# Patient Record
Sex: Male | Born: 1965 | Race: White | Hispanic: No | Marital: Single | Smoking: Current every day smoker
Health system: Southern US, Community
[De-identification: ages and names within clinical notes are randomized; demographics above are authoritative.]

## PROBLEM LIST (undated history)

## (undated) DIAGNOSIS — F329 Major depressive disorder, single episode, unspecified: Secondary | ICD-10-CM

## (undated) DIAGNOSIS — F32A Depression, unspecified: Secondary | ICD-10-CM

## (undated) DIAGNOSIS — I10 Essential (primary) hypertension: Secondary | ICD-10-CM

## (undated) DIAGNOSIS — Z720 Tobacco use: Secondary | ICD-10-CM

## (undated) DIAGNOSIS — I219 Acute myocardial infarction, unspecified: Secondary | ICD-10-CM

## (undated) DIAGNOSIS — I509 Heart failure, unspecified: Secondary | ICD-10-CM

## (undated) DIAGNOSIS — R739 Hyperglycemia, unspecified: Secondary | ICD-10-CM

## (undated) DIAGNOSIS — F1910 Other psychoactive substance abuse, uncomplicated: Secondary | ICD-10-CM

## (undated) DIAGNOSIS — Z8711 Personal history of peptic ulcer disease: Secondary | ICD-10-CM

## (undated) DIAGNOSIS — Z8719 Personal history of other diseases of the digestive system: Secondary | ICD-10-CM

## (undated) DIAGNOSIS — F1410 Cocaine abuse, uncomplicated: Secondary | ICD-10-CM

## (undated) DIAGNOSIS — I779 Disorder of arteries and arterioles, unspecified: Secondary | ICD-10-CM

## (undated) DIAGNOSIS — I255 Ischemic cardiomyopathy: Secondary | ICD-10-CM

## (undated) DIAGNOSIS — I2510 Atherosclerotic heart disease of native coronary artery without angina pectoris: Secondary | ICD-10-CM

## (undated) HISTORY — PX: OTHER SURGICAL HISTORY: SHX169

## (undated) HISTORY — DX: Tobacco use: Z72.0

## (undated) HISTORY — DX: Cocaine abuse, uncomplicated: F14.10

## (undated) HISTORY — DX: Acute myocardial infarction, unspecified: I21.9

## (undated) HISTORY — DX: Essential (primary) hypertension: I10

## (undated) HISTORY — DX: Major depressive disorder, single episode, unspecified: F32.9

## (undated) HISTORY — DX: Depression, unspecified: F32.A

## (undated) HISTORY — DX: Hyperglycemia, unspecified: R73.9

## (undated) HISTORY — DX: Personal history of other diseases of the digestive system: Z87.19

## (undated) HISTORY — DX: Atherosclerotic heart disease of native coronary artery without angina pectoris: I25.10

## (undated) HISTORY — DX: Ischemic cardiomyopathy: I25.5

## (undated) HISTORY — PX: NISSEN FUNDOPLICATION: SHX2091

## (undated) HISTORY — DX: Personal history of peptic ulcer disease: Z87.11

## (undated) HISTORY — DX: Other psychoactive substance abuse, uncomplicated: F19.10

---

## 2005-07-29 ENCOUNTER — Ambulatory Visit: Payer: Self-pay | Admitting: Internal Medicine

## 2005-09-08 ENCOUNTER — Ambulatory Visit: Payer: Self-pay | Admitting: Gastroenterology

## 2005-09-09 ENCOUNTER — Ambulatory Visit: Payer: Self-pay | Admitting: Gastroenterology

## 2005-10-15 ENCOUNTER — Ambulatory Visit: Payer: Self-pay | Admitting: Surgery

## 2005-11-11 ENCOUNTER — Ambulatory Visit: Payer: Self-pay | Admitting: Surgery

## 2005-11-23 ENCOUNTER — Inpatient Hospital Stay: Payer: Self-pay | Admitting: Surgery

## 2006-03-09 ENCOUNTER — Ambulatory Visit: Payer: Self-pay | Admitting: Surgery

## 2008-10-29 ENCOUNTER — Inpatient Hospital Stay: Payer: Self-pay | Admitting: Unknown Physician Specialty

## 2009-02-18 ENCOUNTER — Emergency Department: Payer: Self-pay | Admitting: Emergency Medicine

## 2009-03-17 ENCOUNTER — Emergency Department: Payer: Self-pay | Admitting: Emergency Medicine

## 2009-04-11 ENCOUNTER — Inpatient Hospital Stay: Payer: Self-pay | Admitting: Psychiatry

## 2009-04-16 ENCOUNTER — Inpatient Hospital Stay: Payer: Self-pay | Admitting: Unknown Physician Specialty

## 2009-11-17 ENCOUNTER — Inpatient Hospital Stay: Payer: Self-pay | Admitting: Unknown Physician Specialty

## 2014-06-28 ENCOUNTER — Encounter: Payer: Self-pay | Admitting: Cardiovascular Disease

## 2014-06-28 ENCOUNTER — Inpatient Hospital Stay: Payer: Self-pay | Admitting: Internal Medicine

## 2014-06-28 DIAGNOSIS — I2510 Atherosclerotic heart disease of native coronary artery without angina pectoris: Secondary | ICD-10-CM

## 2014-06-28 DIAGNOSIS — I213 ST elevation (STEMI) myocardial infarction of unspecified site: Secondary | ICD-10-CM

## 2014-06-28 DIAGNOSIS — I2129 ST elevation (STEMI) myocardial infarction involving other sites: Secondary | ICD-10-CM

## 2014-06-28 HISTORY — PX: CARDIAC CATHETERIZATION: SHX172

## 2014-06-28 LAB — DRUG SCREEN, URINE
AMPHETAMINES, UR SCREEN: NEGATIVE (ref ?–1000)
BENZODIAZEPINE, UR SCRN: NEGATIVE (ref ?–200)
Barbiturates, Ur Screen: NEGATIVE (ref ?–200)
COCAINE METABOLITE, UR ~~LOC~~: POSITIVE (ref ?–300)
Cannabinoid 50 Ng, Ur ~~LOC~~: NEGATIVE (ref ?–50)
MDMA (Ecstasy)Ur Screen: NEGATIVE (ref ?–500)
METHADONE, UR SCREEN: NEGATIVE (ref ?–300)
Opiate, Ur Screen: POSITIVE (ref ?–300)
Phencyclidine (PCP) Ur S: NEGATIVE (ref ?–25)
Tricyclic, Ur Screen: NEGATIVE (ref ?–1000)

## 2014-06-28 LAB — COMPREHENSIVE METABOLIC PANEL
ALBUMIN: 3.7 g/dL (ref 3.4–5.0)
Alkaline Phosphatase: 86 U/L
Anion Gap: 5 — ABNORMAL LOW (ref 7–16)
BUN: 14 mg/dL (ref 7–18)
Bilirubin,Total: 0.2 mg/dL (ref 0.2–1.0)
CHLORIDE: 105 mmol/L (ref 98–107)
Calcium, Total: 8.8 mg/dL (ref 8.5–10.1)
Co2: 27 mmol/L (ref 21–32)
Creatinine: 0.98 mg/dL (ref 0.60–1.30)
GLUCOSE: 183 mg/dL — AB (ref 65–99)
OSMOLALITY: 279 (ref 275–301)
Potassium: 3.8 mmol/L (ref 3.5–5.1)
SGOT(AST): 257 U/L — ABNORMAL HIGH (ref 15–37)
SGPT (ALT): 48 U/L
Sodium: 137 mmol/L (ref 136–145)
Total Protein: 7.6 g/dL (ref 6.4–8.2)

## 2014-06-28 LAB — ETHANOL

## 2014-06-28 LAB — CBC
HCT: 47.9 % (ref 40.0–52.0)
HGB: 15.7 g/dL (ref 13.0–18.0)
MCH: 31 pg (ref 26.0–34.0)
MCHC: 32.7 g/dL (ref 32.0–36.0)
MCV: 95 fL (ref 80–100)
Platelet: 375 10*3/uL (ref 150–440)
RBC: 5.06 10*6/uL (ref 4.40–5.90)
RDW: 12.8 % (ref 11.5–14.5)
WBC: 15.6 10*3/uL — AB (ref 3.8–10.6)

## 2014-06-28 LAB — CK-MB
CK-MB: 629.9 ng/mL — AB (ref 0.5–3.6)
CK-MB: 579.2 ng/mL — ABNORMAL HIGH (ref 0.5–3.6)

## 2014-06-28 LAB — PROTIME-INR
INR: 0.9
Prothrombin Time: 11.9 secs (ref 11.5–14.7)

## 2014-06-28 LAB — HEMOGLOBIN A1C: HEMOGLOBIN A1C: 5.6 % (ref 4.2–6.3)

## 2014-06-28 LAB — APTT: Activated PTT: 27 secs (ref 23.6–35.9)

## 2014-06-28 LAB — TROPONIN I: Troponin-I: 39 ng/mL — ABNORMAL HIGH

## 2014-06-28 LAB — CK TOTAL AND CKMB (NOT AT ARMC)
CK, TOTAL: 2073 U/L — AB (ref 39–308)
CK-MB: 196.9 ng/mL — ABNORMAL HIGH (ref 0.5–3.6)

## 2014-06-29 DIAGNOSIS — I214 Non-ST elevation (NSTEMI) myocardial infarction: Secondary | ICD-10-CM

## 2014-06-29 LAB — LIPID PANEL
CHOLESTEROL: 140 mg/dL (ref 0–200)
HDL: 45 mg/dL (ref 40–60)
Ldl Cholesterol, Calc: 60 mg/dL (ref 0–100)
TRIGLYCERIDES: 176 mg/dL (ref 0–200)
VLDL CHOLESTEROL, CALC: 35 mg/dL (ref 5–40)

## 2014-06-29 LAB — CK TOTAL AND CKMB (NOT AT ARMC)
CK, Total: 2612 U/L — ABNORMAL HIGH (ref 39–308)
CK-MB: 189.4 ng/mL — ABNORMAL HIGH (ref 0.5–3.6)

## 2014-06-29 LAB — BASIC METABOLIC PANEL
ANION GAP: 7 (ref 7–16)
BUN: 8 mg/dL (ref 7–18)
CALCIUM: 8.6 mg/dL (ref 8.5–10.1)
CO2: 25 mmol/L (ref 21–32)
Chloride: 107 mmol/L (ref 98–107)
Creatinine: 0.92 mg/dL (ref 0.60–1.30)
EGFR (Non-African Amer.): >60
Glucose: 109 mg/dL — ABNORMAL HIGH (ref 65–99)
OSMOLALITY: 276 (ref 275–301)
Potassium: 3.6 mmol/L (ref 3.5–5.1)
SODIUM: 139 mmol/L (ref 136–145)

## 2014-07-04 ENCOUNTER — Telehealth: Payer: Self-pay

## 2014-07-04 NOTE — Telephone Encounter (Signed)
Patient contacted regarding discharge from [facility] on 06/30/14.  Patient understands to follow up with Dr. Fletcher Anon on 07/10/14 at 1:45 at [facility]. Patient understands discharge instructions? yes Patient understands medications and regiment? yes Patient understands to bring all medications to this visit? yes  Pt states that he took his last pill today, is scheduled w/ the [facility] on Monday to get more meds.  Pt reports that he has been dealing w/ the recent death of his mother and has not had time to think about himself.

## 2014-07-09 ENCOUNTER — Encounter: Payer: Self-pay | Admitting: *Deleted

## 2014-07-10 ENCOUNTER — Encounter: Payer: Self-pay | Admitting: Physician Assistant

## 2014-07-10 ENCOUNTER — Ambulatory Visit (INDEPENDENT_AMBULATORY_CARE_PROVIDER_SITE_OTHER): Payer: Self-pay | Admitting: Physician Assistant

## 2014-07-10 VITALS — BP 122/70 | HR 87 | Ht 63.0 in | Wt 133.5 lb

## 2014-07-10 DIAGNOSIS — F329 Major depressive disorder, single episode, unspecified: Secondary | ICD-10-CM

## 2014-07-10 DIAGNOSIS — I255 Ischemic cardiomyopathy: Secondary | ICD-10-CM | POA: Insufficient documentation

## 2014-07-10 DIAGNOSIS — I2510 Atherosclerotic heart disease of native coronary artery without angina pectoris: Secondary | ICD-10-CM | POA: Insufficient documentation

## 2014-07-10 DIAGNOSIS — I10 Essential (primary) hypertension: Secondary | ICD-10-CM

## 2014-07-10 DIAGNOSIS — F32A Depression, unspecified: Secondary | ICD-10-CM | POA: Insufficient documentation

## 2014-07-10 DIAGNOSIS — F1910 Other psychoactive substance abuse, uncomplicated: Secondary | ICD-10-CM

## 2014-07-10 DIAGNOSIS — R739 Hyperglycemia, unspecified: Secondary | ICD-10-CM

## 2014-07-10 MED ORDER — PRASUGREL HCL 10 MG PO TABS: 10.0000 mg | ORAL_TABLET | Freq: Every day | ORAL | Status: DC

## 2014-07-10 NOTE — Progress Notes (Signed)
Patient Name: Marcus [name], Dues 1966/05/26, MRN [id number]  Date of Encounter: 07/10/2014  Primary Care Provider:  No PCP Per Patient Primary Cardiologist:  Dr. Fletcher Anon, MD  Patient Profile:  48 y.o. male with history of polysubstance abuse presents for hospital follow up after recent admission to [facility] from 12/10-12/12 for ST elevation MI involving the Ramus intermedius s/p PCI/DES.    Problem List:   Past Medical History  Diagnosis Date  . Hypertension   . Depression   . H/O gastric ulcer   . Cocaine abuse   . Tobacco abuse   . CAD in native artery     a. 06/28/2014: STEMI, cath LM nl, mLAD 50%, mLCx 20%, OM1 small in size 80%, ramus 100% s/p PCI/DES 0%, EF 40%   . Ischemic cardiomyopathy     a. EF 40% by cath 06/28/2014  . Polysubstance abuse     a. cocaine, tobacco, and alcohol   . Hyperglycemia     a. A1C 5.6%  . MI (myocardial infarction)    Past Surgical History  Procedure Laterality Date  . Nissen fundoplication    . Stomach ulcer surgery    . Cardiac catheterization  06/28/2014    x1 stent     Allergies:  Allergies  Allergen Reactions  . Codeine   . Percocet [Oxycodone-Acetaminophen]      HPI:  48 y.o. male with history of polysubstance abuse who denied any prior known cardiac history. Though he does not seek medical care often in the outpatient setting. 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 He denies any prior PMH. He reports polysubstance abuse to include ETOH, tobacco up to 4-5 packs of cigarettes daily, and cocaine.   He was admitted to [facility] on 12/10 after developing continuous chest pain that radiated to his back the night before with a ST elevation MI. EKG revealed borderline 1 mm of lateral ST elevation. Troponin was found to be 39.00. CT of the chest showed no aortic dissection. He was taken urgently to the cardiac cath lab that showed an occluded Ramus intermedius s/p PCI/DES (see above for remaining cath  details). He was advised to discontinue alcohol, tobacco, and cocaine abuse. He was started on optimal medical therapy. He was seen by psychiatry while inpatient 2/2 his psychosocial issues - which he minimized. He was started on trazodone and advised to follow up as an outpatient.   He has not taken any medication, including Effient or aspirin since 07/03/2014. Apparently he was given samples of Effient x 4 days and a free Rx card. He last took Effient on 12/15 but never took the Rx card to the pharmacy. He is without chest pain, SOB, nausea, vomiting, diaphoresis, presyncope, or syncope. He has been active since his discharge, apparently moving furniture out of his late mother's house. He has done this without any symptoms.   He is trying E-cigarettes to quit at this time. He is also still smoking regular cigarettes 8-10 daily. This is down from 4-5 packs per day. He denies any cocaine use. States the last time he used was the day before he presented to [facility]. He continues to drink ETOH approximately 1 beer per day.   He now says his mother passed 1 week ago. In the hospital he reported this occurred months ago.    Home Medications:  Prior to Admission medications   Medication Sig Start Date End Date Taking? Authorizing Provider  aspirin 81 MG tablet  Take 81 mg by mouth daily.   No Historical Provider, MD  atorvastatin (LIPITOR) 40 MG tablet Take 40 mg by mouth daily.   No Historical Provider, MD  carvedilol (COREG) 6.25 MG tablet Take 6.25 mg by mouth 2 (two) times daily with a meal.   No Historical Provider, MD  lisinopril (PRINIVIL,ZESTRIL) 10 MG tablet Take 10 mg by mouth daily.   No Historical Provider, MD  prasugrel (EFFIENT) 10 MG TABS tablet Take 10 mg by mouth daily.   No Historical Provider, MD  traZODone (DESYREL) 100 MG tablet Take 100 mg by mouth at bedtime.   No Historical Provider, MD     Weights: Filed Weights   07/10/14 1357  Weight: 133 lb 8 oz (60.555 kg)     Review  of Systems:  As above. All other systems reviewed and are otherwise negative except as noted above.  Physical Exam:  Blood pressure 122/70, pulse 87, height 5\' 3"  (1.6 m), weight 133 lb 8 oz (60.555 kg).  General: Pleasant, NAD Psych: Normal affect. Neuro: Alert and oriented X 3. Moves all extremities spontaneously. HEENT: Normal  Neck: Supple without bruits or JVD. Lungs:  Resp regular and unlabored, CTA. Heart: RRR no s3, s4, or murmurs. Abdomen: Soft, non-tender, non-distended, BS + x 4.  Extremities: No clubbing, cyanosis or edema.    Accessory Clinical Findings:  EKG - NSR, 87 bpm, right axis, inferolateral TWI c/w prior infarct    Assessment & Plan:  1. CAD with history of ST elevation MI 06/28/2014 s/p PCI/DES to Ramus intermedius: -He is without angina or SOB -He has been without DAPT for the past 7 days (last date of therapy 07/03/14) -He was given Effient samples (4 days) upon discharge and a free Rx drug card. He reports taking the samples, but did not go to the pharmacy to active the free Rx card nor did he call us to notify us of this  -It was explained to him in great detail that he must be on DAPT for at least the next 12 months - he now understands this and states he will do whatever it takes to continue both aspirin and Effient therapy  -He was given both aspirin and Effient samples today for 30 days -I reviewed the medication card with him. He will stop by the pharmacy on his way home to take care of that so he will have Effient on the way. He will also pick up aspirin  -DAPT with aspirin 81 mg and Effient 10 mg for at least 12 months -He declines cardiac rehab at this time 2/2 finances, he will begin an exercise program  -TC 140, LDL 60, HDL 45, TG 176 (this was pre-statin) - will hold statin at this time given his finances and re-evaluate at a later date -He cannot afford Coreg at this time. Given his cocaine history I do not feel comfortable placing him on a  different b-blocker. We will re-evaluate this at his follow up.   2. Ischemic cardiomyopathy: -Given the above lifestyle choices are a must -He must help himself as we help him  -Would ideally like to continue lisinopril when his finances allow   3. HTN: -Controlled  4. Hyperglycemia: -A1C 5.6% while inpatient -Close monitoring   5. Polysubstance abuse -Advised cessation of alcohol, tobacco, and cocaine  -He reports no further cocaine abuse -He reports tobacco abuse has declined from 4-5 packs daily to 8-10 cigarettes daily along with E-cigarettes -He continues to drink approximately 1  beer daily  6. Psychosocial issues: -Seen by psych while inpatient, not candidate for inpatient treatment at that time -Started on trazodone with outpatient follow up -No SI or HI   Christell Faith, PA-C [facility] [address] [place], [place] [address] [phone] [facility] 07/10/2014, 2:11 PM

## 2014-07-10 NOTE — Patient Instructions (Signed)
Your physician has recommended you make the following change in your medication:  Stop Atorvastatin  Stop Carvedilol  Stop Lisinopril  Stop Trazodone   Continue to take:  Aspirin 81 mg once daily  Effient 10 mg once daily   Your physician recommends that you schedule a follow-up appointment in:  1 month

## 2014-07-29 DIAGNOSIS — F1910 Other psychoactive substance abuse, uncomplicated: Secondary | ICD-10-CM

## 2014-07-29 DIAGNOSIS — R739 Hyperglycemia, unspecified: Secondary | ICD-10-CM

## 2014-07-29 DIAGNOSIS — I255 Ischemic cardiomyopathy: Secondary | ICD-10-CM

## 2014-07-29 DIAGNOSIS — F329 Major depressive disorder, single episode, unspecified: Secondary | ICD-10-CM

## 2014-07-29 DIAGNOSIS — I2510 Atherosclerotic heart disease of native coronary artery without angina pectoris: Secondary | ICD-10-CM

## 2014-08-03 ENCOUNTER — Ambulatory Visit: Payer: Self-pay | Admitting: Physician Assistant

## 2014-08-08 ENCOUNTER — Encounter: Payer: Self-pay | Admitting: Cardiovascular Disease

## 2014-08-10 ENCOUNTER — Ambulatory Visit: Payer: Self-pay | Admitting: Physician Assistant

## 2014-08-22 ENCOUNTER — Ambulatory Visit: Payer: Self-pay | Admitting: Physician Assistant

## 2014-09-26 ENCOUNTER — Telehealth: Payer: Self-pay

## 2014-09-26 NOTE — Telephone Encounter (Signed)
Lmtcb letting patient know.

## 2014-09-26 NOTE — Telephone Encounter (Signed)
Spoke with pharmacy and they mentioned that they need his insurance card information in order to run medication.

## 2014-09-26 NOTE — Telephone Encounter (Signed)
Effient 10 mg samples @ front desk for pick up.

## 2014-09-26 NOTE — Telephone Encounter (Signed)
Pt states he is having problems with the pharmacy with his Effient. Please call, also and would like some samples. States he has been without it for 3 days.

## 2014-10-01 ENCOUNTER — Telehealth: Payer: Self-pay

## 2014-10-01 NOTE — Telephone Encounter (Signed)
Pt called states he keeps "having pains", and his vision is "going bad", also hurts when he swallows. No cp now. CP has been going on 3 weeks.

## 2014-10-01 NOTE — Telephone Encounter (Signed)
Spoke with patient  Patient stated he is having intermittent chest pain for 3 weeks  The pain is dull and hurts in his chest and back  He also gets blurred vision and and his hands feel numb at times  He stated he is sensitive to bright lights  He has been of all of his medications for 2 weeks and is picking up samples today He states he feels fine right now and has had no episodes today  He was unable to tell me if the pain came with exertion or improved with rest  He is unaware of his current vital signs  He does not feel that his situation is emergent  He is having pain with swallowing and stated the pain feels nothing like his heart attack      Discussed patient issue with Ignacia Bayley NP  Per Gerald Stabs instructed patient to contact EMS immediately if he has any more episodes of chest pain  Patient scheduled with Dr. Fletcher Anon 4/4

## 2014-10-03 NOTE — Telephone Encounter (Signed)
4/4 is fine but he should take his medications and call EMS if he gets chest pain.

## 2014-10-03 NOTE — Telephone Encounter (Signed)
Informed patient of Dr. Jacklynn Ganong response  Patient verbalized understanding

## 2014-10-22 ENCOUNTER — Encounter: Payer: Self-pay | Admitting: *Deleted

## 2014-10-22 ENCOUNTER — Ambulatory Visit: Payer: Self-pay | Admitting: Cardiovascular Disease

## 2014-11-10 NOTE — Consult Note (Signed)
Brief Consult Note: Diagnosis: depression nos.   Patient was seen by consultant.   Consult note dictated.   Orders entered.   Comments: Psychiatry: Patient seen and chart reviewed and note dictated. Patient with depression and substance abuse. Minimizes distress. Does not need inpatient psych treatment but should be given information about outpt treatment. Started trazodone 100mg  qhs for sleep.  Electronic Signatures: Gonzella Lex (MD)  (Signed 11-Dec-15 18:00)  Authored: Brief Consult Note   Last Updated: 11-Dec-15 18:00 by Gonzella Lex (MD)

## 2014-11-10 NOTE — H&P (Signed)
PATIENT NAME:  Marcus [name], ARRIGHI MR#:  [id number] DATE OF BIRTH:  1966/03/20  DATE OF ADMISSION:  06/28/2014  PRIMARY CARE PHYSICIAN: Nonlocal.  CARDIOLOGIST:    Muhammad A. Fletcher Anon, MD  CHIEF COMPLAINT:  Chest pain.   HISTORY OF PRESENT ILLNESS: The patient is a 49 year old Caucasian male who came into the ED with a chief complaint of sudden onset of chest pain which started since last night. The chest pain was radiating to his back and he was having diaphoresis associated with some shortness of breath. The patient took some aspirin and Xanax prior to coming to the ED. In the ED, the patient's initial troponin is at 39.00. CK total is 2073, CPK-MB 196.9. His alcohol level was less than 3. His cardiologist, Dr. Fletcher Anon, was called and notified and the patient went for cardiac catheterization this morning. The patient has history of cocaine abuse so a beta blocker was not given. During cardiac catheterization, they found total occlusion of the ramus artery which was fixed with a stent. Subsequently, after catheterization, the patient is sent over to the intensive care unit for further monitoring. Hospitalist team is called to admit the patient. During my examination, the patient is resting comfortably and denies any chest pain or shortness of breath. No family members at bedside. No other complaints.   PAST MEDICAL HISTORY:  Reporting hypertension, not on any medication, no history of diabetes mellitus, depression, history of gastric ulcers.   PAST SURGICAL HISTORY: Nissen fundoplication,  stomach ulcer surgery, currently cardiac catheterization with stent placement.   ALLERGIES: CODEINE AND PERCOCET.   PSYCHOSOCIAL HISTORY: Lives at home with family. He smokes 4 to 5 packs per day. Occasional intake of alcohol. Recreational drugs, cocaine.   FAMILY HISTORY: Hypertension runs in his family.   HOME MEDICATIONS: Not on any current medications.   REVIEW OF SYSTEMS: CONSTITUTIONAL: Denies any fever or  fatigue.  EYES: Denies any blurry vision or double vision. Denies any infection of his eyes.  ENT: Denies epistaxis, discharge, or ear pain or hearing loss.  RESPIRATION: Denies any cough. No wheezing. No hemoptysis.  CARDIOVASCULAR: Denies any chest pain or shortness of breath. No history of coronary artery disease or heart attacks in the past.  GASTROINTESTINAL: Denies any nausea, vomiting, hematemesis or melena.   GENITOURINARY: No dysuria, hematuria, renal colic or urinary frequency.  ENDOCRINE: Denies polyuria, nocturia, thyroid problems.  LYMPHATIC: No anemia, easy bruising, bleeding.  INTEGUMENTARY: No acne, rash, lesions.  MUSCULOSKELETAL: Denies any joint pain or neck pain. Denies any history of arthritis. NEUROLOGIC:  Denies any vertigo, ataxia. No history of CVA or transient ischemic attacks. PSYCHOLOGICAL:  No ADD, but has a chronic history of depression.   PHYSICAL EXAMINATION:  VITAL SIGNS: Temperature 98.9, pulse 88, respirations 18, blood pressure 144/100, pulse ox 100% on 2 liters.  GENERAL APPEARANCE: Not in acute distress. Moderately built and not agitated. HEENT:  Normocephalic, atraumatic. Pupils are equally reacting to light and accommodation. No scleral icterus. No conjunctival injection. No sinus tenderness. No postnasal drip. Moist mucous membranes.  NECK: Supple. No JVD. No thyromegaly. Range of motion is intact.  LUNGS: Clear to auscultation bilaterally. No accessory muscle use and no anterior chest wall tenderness on palpation.  CARDIAC: S1, S2 normal. Regular rate and rhythm. No murmurs.  GASTROINTESTINAL: Soft. Bowel sounds are positive in all 4 quadrants. Nontender, nondistended. No hepatosplenomegaly. No masses felt.  NEUROLOGIC: Awake, alert and oriented x3. Cranial nerves 2 through 12 are grossly intact. Motor and sensory are intact,  reflexes are 2+.  EXTREMITIES: Left groin area is intact with clean dressing. No cyanosis. No clubbing. No joint effusion. No  erythema. PSYCHIATRIC:  Flat mood and effect  LABORATORY STUDIES:  CK total is 2073, CPK-MB 196.9. Troponin 39.0, repeat CPK MB 629.9 and 579.2. WBC 15,600, hemoglobin, hematocrit and platelets are normal, PT-INR are normal. BMP anion gap is at 7 rest of the BMP is normal. Glucose is at 183 LFTs: AST is elevated at 257. Rest of the LFTs are normal.   CT angiogram of the chest, abdomen, and pelvis without and without contrast has revealed negative for pulmonary embolism, mild coronary artery calcification, negative aortic dissection. Lungs are clear without infiltrate. Mild sigmoid diverticulosis without acute inflammation. Portable chest x-ray questionable patchy increased left basilar opacity versus artifact. Cardiac catheterization is performed on 06/28/2014 by Dr. Fletcher Anon, a drug-eluting stent was placed on 100% lesion in the ramus intermedius. Following intervention, there was an excellent angiographic appearance with 0% residual stenosis.   ASSESSMENT AND PLAN: A 49 year old Caucasian male who came into the emergency department with a chief complaint of chest pain with elevated troponin at 39.0, evaluated by cardiology, had a cardiac catheterization done today and had one stent placement.  1. Non-ST-elevation myocardial infarction with 100% blockage of ramus intermedius status post stent placement. The patient will be monitored on telemetry in the intensive care unit. Aggressive medical management, double antiplatelet therapy for at least one year was recommended. The patient is in cardiac rehabilitation program. We cannot give him any beta blocker in view of cocaine abuse. We will follow up with cardiology and check a.m. labs. We will check fasting lipid panel in the morning. Will continue aspirin, Coreg statin and lisinopril as recommended by cardiology. We will provide him nitroglycerin as needed basis. The patient is on Effient 10 mg p.o. once daily. We will provide pain management as needed basis  with morphine.  2. Hypertension. We will continue Coreg and lisinopril and then up titrate as needed basis.  3. Hyperglycemia, probably stress related. Hemoglobin A1c is at 5.6, diabetes mellitus is ruled out.  4. Depression, stable at this time. Denies any suicidal or homicidal ideation.  5. Cocaine abuse, is strongly advised to stop using recreational drugs.  6. Tobacco abuse, 4 to 5 packs per day. Counseled the patient to quit smoking for 4 to 5 minutes. The patient will be continued on nicotine patch. The patient is agreeable to quit smoking. 7. The patient will be on gastrointestinal prophylaxis.   The plan of care was discussed in detail with the patient and he verbalized understanding.   TOTAL TIME SPENT: 45 minutes     ____________________________ Nicholes Mango, MD ag:kl D: 06/28/2014 16:56:06 ET T: 06/28/2014 17:16:28 ET JOB#: [id number]  cc: Nicholes Mango, MD, <Dictator> Muhammad A. Fletcher Anon, MD Nicholes Mango MD ELECTRONICALLY SIGNED 07/05/2014 17:05

## 2014-11-10 NOTE — Discharge Summary (Signed)
PATIENT NAME:  Marcus [name], Marcus [name] MR#:  [id number] DATE OF BIRTH:  25-Oct-1965  DATE OF ADMISSION:  06/28/2014 DATE OF DISCHARGE:  06/30/2014  PRESENTING COMPLAINT: Chest pain.   DISCHARGE DIAGNOSES: 1.  Acute myocardial infarction.  2.  Tobacco abuse.  3.  Cocaine abuse.   CODE STATUS: FULL code.   MEDICATIONS AT DISCHARGE: 1.  Lisinopril 10 mg p.o. daily.  2.  Trazodone 100 mg at bedtime.  3.  Atorvastatin 40 mg daily.  4.  Aspirin 81 mg daily.  5.  Effient 10 mg p.o. daily.  6.  Coreg 6.25 tablet b.i.d.   DISCHARGE DIET: Low fat, low cholesterol diet.   DISCHARGE FOLLOWUP:  1.  With Dr. Fletcher Anon in 1 to 2 weeks.  2.  The patient advised to go to [facility] to get medications filled and establish primary care. 3.  The patient also advised to go to medication management clinic on Monday to get his medications filled.   CONSULTATIONS:  1.  Cardiology - Dr. Fletcher Anon.  2.  Dr. Weber Cooks - psychiatry.   DIAGNOSTIC DATA: Lipid profile within normal limits. Basic metabolic panel within normal limits. Urine drug screen positive for cocaine and opiates.   Cardiac catheterization done on December 10th showed severe 1 vessel coronary artery disease, ramus intermedius. There was 100% stenosis in the proximal third of the vessel segment. There was TIMI grade flow zero through the vessel. This is likely the culprit for the patient's presentation. Drug-eluting stent was performed on 100% lesion in ramus intermedius. EF is 40%.   Troponin maxed out to 39.   BRIEF SUMMARY OF HOSPITAL COURSE: [name] is a 49 year old Caucasian gentleman who looks older than his stated age with no significant past medical history who came into the Emergency Room with chest pain and was found to have:  1.  Non-STEMI with 100% blockage of ramus intermedius status post drug-eluting stent placement. The patient was placed on Coreg, statins, lisinopril, aspirin and Effient. He has been set up with medication  management clinic to get his meds refilled.  2.  Hypertension. On Coreg and lisinopril.  3.  Cocaine abuse. The patient is strongly advised to stop using recreational drugs. 4.  Tobacco abuse. The patient also advised on smoking cessation.   Hospital stay otherwise remained stable. The patient remained a FULL code.   TIME SPENT: 40 minutes.   ____________________________ Hart Rochester Posey Pronto, MD sap:sb D: 07/02/2014 06:53:16 ET T: 07/02/2014 07:45:34 ET JOB#: [id number]  cc: Ildefonso Keaney A. Posey Pronto, MD, <Dictator> Ilda Basset MD ELECTRONICALLY SIGNED 07/13/2014 17:01
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
## 2014-11-10 NOTE — Consult Note (Signed)
General Aspect Primary Cardiologist: New to [facility] ____________________  49 year old male with history of polysubstance abuse presented to [facility] this morning with chest pain after developing onset of chest pain radiating to the back the prior evening around 10 PM.   PMH: 1. Cocaine abuse 2. Ongoing tobacco abuse (4-5 packs per day) ____________________   Present Illness 49 year old male with the above problem list who presented to [facility] this morning with chet pain after developing onset of chest pain radiating to the back the prior evening around 10 PM.  He denies any prior cardiac history, though he does not seek medical care often in the outpatient setting. 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 He denies any prior PMH. He reports polysubstance abuse to include ETOH, tobacco up to 4-5 packs of cigarettes daily, and cocaine.   He was at work with his brother on 12/9 and felt fine per his report. Around 10 PM he was dropping his brother off at his house and developed chest pain that radiated to his back. He did not seek medical care at that time, instead waiting until this morning. Pain was not associated with any nausea, vomiting, diaphoresis, palpitations, SOB, presyncope, or syncope. Pain persisted all night. He reports feeling in his usual state of health leading up to this event.   Upon his arrival to [facility] he was found to have a troponin of 39.00. EKG showed NSR, 87, right axis, nonspecific lateral st changes. 2/2 his complaint of chest pain radiating to his back he underwent CTA of the chest to r/o aortic dissection, which was negative. He underwent cardiac catheterization which showed occluded Ramus. He underwent successful PCI/DES, with 0% residual stenosis. EF was 40%. Remaining angiography findings include:  Angiographic findings Native coronary lesions:  Mid LAD: Lesion 1: diffuse, 50 % stenosis.  Mid circumflex: Lesion 1: 20 % stenosis.  OM1: Lesion 1:  80 % stenosis.  Ramus intermedius: Lesion 1: 100 % stenosis. Intervention results Native coronary lesions: drug-eluting stent of the 100 % stenosis in ramus intermedius. Appearance excellent with 0 % residual stenosis. Stent: 2.75 x 81m Resolute Integrity drug-eluting.  He was advised to stop cocaine use.   Physical Exam:  GEN well developed, well nourished, no acute distress   HEENT PERRL, hearing intact to voice, moist oral mucosa   NECK supple   RESP normal resp effort  clear BS   CARD Regular rate and rhythm  Normal, S1, S2  No murmur   ABD denies tenderness  soft   EXTR negative edema, cath site clean and dressed   SKIN normal to palpation   NEURO cranial nerves intact   PSYCH alert, A+O to time, place, person   Review of Systems:  General: No Complaints   Skin: No Complaints   ENT: No Complaints   Eyes: No Complaints   Neck: No Complaints   Respiratory: No Complaints   Cardiovascular: Chest pain or discomfort   Gastrointestinal: No Complaints   Genitourinary: No Complaints   Vascular: No Complaints   Musculoskeletal: No Complaints   Neurologic: No Complaints   Hematologic: No Complaints   Endocrine: No Complaints   Psychiatric: No Complaints   Review of Systems: All other systems were reviewed and found to be negative   Medications/Allergies Reviewed Medications/Allergies reviewed   Family & Social History:  Family and Social History:  Family History Coronary Artery Disease   Social History positive ETOH, positive Illicit drugs, cocaine   +  Tobacco Current (within 1 year)  4-5 packs per day   Place of Living Home     tobacco abuse:    cocaine abuse:    Depression:    ulcers:    niseen fundiplication:    stomach ulcer surgery:   Lab Results:  Hepatic:  10-Dec-15 07:18   Bilirubin, Total 0.2  Alkaline Phosphatase 86 (46-116 NOTE: New Reference Range 02/06/14)  SGPT (ALT) 48 (14-63 NOTE: New Reference  Range 02/06/14)  SGOT (AST)  257  Total Protein, Serum 7.6  Albumin, Serum 3.7  Cardiac Catherization:  10-Dec-15 08:47   Cardiac Catheterization  [facility] [address] [place],  [address] [phone]   Cardiovascular Catheterization Comprehensive Report   Patient: KEEVEN MATTY Study date: 06/28/2014 MR number: [id number] Account number: [id number]   DOB: 02-24-1966 Age: 66 years Gender: Male Race: White Height: 63 in Weight: 136.6 lb   Diagnostic Cardiologist:  Kathlyn Sacramento, MD   SUMMARY:   -HEMODYNAMICS: Hemodynamic assessment demonstrates mild systemic hypertension and moderately elevated LVEDP.   -CORONARY CIRCULATION: There was severe 1-vessel coronary artery disease. Ramus intermedius: There was a 100 % stenosis in the proximal third of the vessel segment. There was TIMI grade 0 flow through the vessel (no flow). This is a likely culprit for the patient's clinical presentation. An intervention was performed.   -CARDIAC STRUCTURES: Analysis of regional contractile function demonstrated severe anterolateral hypokinesis. Global left ventricular function was mildly depressed. EF estimated was 40 %.   -1ST LESION INTERVENTIONS: A drug-eluting stent was performed on the 100 % lesion in the ramus intermedius. Following intervention there was an excellent angiographic appearance with a 0 % residual stenosis.   -RECOMMENDATIONS: Patient management should include aggressive medical therapy, a cardiac rehabilitation program, and smoking cessation. Recommend dual antiplatelet therapy for at least 1 year. I strongly advised him to stop Cocaine use.   HEMODYNAMICS: Hemodynamic assessment demonstrates mild systemic hypertension and moderately elevated LVEDP.   CORONARY CIRCULATION: The coronary circulation is right dominant. There was severe 1-vessel coronary artery disease. Left main: Normal. LAD: The vessel was normal sized. Mid LAD: There was  a diffuse 50 % stenosis. 1st diagonal: The vessel was very small sized. 2nd diagonal: The vessel was very small sized. 3rd diagonal: The vessel was very small sized. Circumflex: The vessel was small sized. Angiography showed minor luminal irregularities. Mid circumflex: There was a 20 % stenosis. 1st obtuse marginal: The vessel was small sized. There was a 80 % stenosis in the proximal third of the vessel segment. 2nd obtuse marginal: The vessel was very small sized. 3rd obtuse marginal: The vessel was small sized. Angiography showed minor luminal irregularities. Ramus intermedius: There was a 100 % stenosis in the proximal third of the vessel segment. There was TIMI grade 0 flow through the vessel (no flow). This is a likely culprit for the patient's clinical presentation. An intervention was performed. RCA: The vessel was normal sized. Angiography showed minor luminal irregularities. Right PDA: The vessel was normal sized. Angiography showed no evidence of disease. Right posterolateral segment: The vessel was normal sized. Angiography showed no evidence of disease. 1st posterolateral segment: The vessel was normal sized. Angiography showed no evidence of disease. 2nd posterolateral segment: The vessel was medium sized. Angiography showed no evidence of disease.   VENTRICLES: Analysis of regional contractile function demonstrated severe anterolateral hypokinesis. Global left ventricular function was mildly depressed. EF estimated was 40 %. RECOMMENDATIONS: -Patient management should include aggressive medical therapy, a cardiac  rehabilitation program, and smoking cessation. Recommend dual antiplatelet therapy for at least 1 year. I strongly advised him to stopCocaine use.   INDICATIONS: Angina/MI: myocardial infarction with ST elevation (STEMI), CCS class IV.   HISTORY: No history of previous myocardial infarction. There was no prior diagnosis of congestive heart failure. The  patient has a history of current cigarette use and a family history of coronary artery disease. There was no history of congestive heart failure, cardiogenic shock, diabetes, or cardiac arrest. PRIOR CARDIOVASCULAR PROCEDURES: No history of valve surgery.   PRIOR DIAGNOSTIC TEST RESULTS: No prior stress test is available.   PROCEDURES PERFORMED: Left heart catheterization with ventriculography. Intervention on ramus intermedius: drug-eluting stent.   COMPLICATIONS: No complication occurred during the cath lab visit.   PROCEDURE: The risks and alternatives of the procedures and conscious sedation were explained to the patient and informed consent was obtained. The patient was brought to the cath lab and placed on the table. The planned puncture siteswere prepped and draped in the usual sterile fashion.   -Right radial artery access. The vessel was accessed, a wire was threaded into the vessel, and a was advanced over the wire into the vessel.   -Left heart catheterization. A catheter was advanced to the ascending aorta. Ventriculography was performed using power injection of contrast agent.   LESION INTERVENTION: A drug-eluting stent was performed on the 100 % lesion in the ramus intermedius. Following intervention there was an excellent angiographic appearance with a 0 % residual stenosis. The intervention was performed at the site of a prior balloon angioplasty and stent. This was an ACC/AHA "non-high risk" lesion for intervention. There was TIMI 2 flow before the procedure and TIMI 3 flow after the procedure. There was no perforation. There was no dissection.   -Vessel setup was performed. A 67F IKARI LEFT 3.5 guiding catheter was used to intubate the vessel.   -Vessel setup was performed. A Runthrough NS 180cm wire was used to cross the lesion.   -Balloon angioplasty was performed, using a Rx Trek 2.5 x 26m balloon, with 1 inflation(s) and a maximum inflation pressure  of 8 atm.   -A 2.75 x 298mResolute Integrity drug-eluting stent was placed across the lesion and deployed at a maximum inflation pressure of 12 atm.   -Balloon angioplasty was performed, using a [place] Trek 3.0 x 2025malloon, with 1 inflation(s) and a maximum inflation pressure of 14 atm.   PROCEDURE COMPLETION: TIMING: Test started at 09:00. Test concluded at 09:35. RADIATION EXPOSURE: Fluoroscopy time: 5.67 min. Fluoroscopy dose: 1.184 Gray. MEDICATIONS GIVEN: Midazolam, 1 mg, IV, at 09:00. Fentanyl, 50 mcg, IV, at 09:06. Midazolam, 1 mg, IV, at 09:07. Midazolam, 1 mg, IV, at 09:10. Verapamil (Isoptin, Calan, Covera), 2.5 mg, IA, at 09:03. Nitroglycerin, 200 mcg, intracoronary, at 09:13. Nitroglycerin, 200 mcg, intracoronary, at 09:19. Angiomax Bolus, 9 ml, IV, at 09:10. Angiomax Drip, infusion rate of 21 ml/hr, IV, at 09:13. Angiomax Drip, infusion rate of 3.1 ml/hr, IV, at 09:31. CONTRAST GIVEN: Isovue 180 ml.   Prepared and signed by   MuhKathlyn SacramentoD Signed 06/28/2014 09:59:16   STUDY DIAGRAM   Angiographic findings Native coronary lesions:  Mid LAD: Lesion 1: diffuse, 50 % stenosis.  Mid circumflex: Lesion 1: 20 % stenosis.  OM1: Lesion 1: 80 % stenosis.  Ramus intermedius: Lesion 1: 100 % stenosis. Intervention results Native coronary lesions: drug-eluting stent of the 100 % stenosis in ramus intermedius. Appearance excellent with 0 % residual stenosis. Stent: 2.75  x 50m Resolute Integrity drug-eluting.   HEMODYNAMIC TABLES   Pressures:  Baseline Pressures:  - HR: 97 Pressures:  - Rhythm: Pressures:  -- Aortic Pressure (S/D/M): 149/89/116 Pressures:  -- Left Ventricle (s/edp): 141/27/--   Outputs:  Baseline Outputs:  -- CALCULATIONS: Body Surface Area: 1.65 Outputs:  -- CALCULATIONS: Height in cm: 160.00 Outputs:  -- CALCULATIONS: Sex: Male Outputs:  -- CALCULATIONS: Weight in kg: 62.10  Routine Chem:  10-Dec-15 07:18   Ethanol, S. < 3 (Result(s)  reported on 28 Jun 2014 at 09:00AM.)  Result Comment TROPONIN - RESULTS VERIFIED BY REPEAT TESTING.  - HP TO LAUREN SKELLY_0  ON 06/28/14  - READ-BACK PROCESS PERFORMED.  Result(s) reported on 28 Jun 2014 at 08:01AM.  Glucose, Serum  183  BUN 14  Creatinine (comp) 0.98  Sodium, Serum 137  Potassium, Serum 3.8  Chloride, Serum 105  CO2, Serum 27  Calcium (Total), Serum 8.8  Osmolality (calc) 279  eGFR (African American) >60  eGFR (Non-African American) >60 (eGFR values <624mmin/1.73 m2 may be an indication of chronic kidney disease (CKD). Calculated eGFR, using the MRDR Study equation, is useful in  patients with stable renal function. The eGFR calculation will not be reliable in acutely ill patients when serum creatinine is changing rapidly. It is not useful in patients on dialysis. The eGFR calculation may not be applicable to patients at the low and high extremes of body sizes, pregnant women, and vegetarians.)  Anion Gap  5  Cardiac:  10-Dec-15 07:18   CPK-MB, Serum  196.9 (Result(s) reported on 28 Jun 2014 at 08:01AM.)  Troponin I  39.00 (0.00-0.05 0.05 ng/mL or less: NEGATIVE  Repeat testing in 3-6 hrs  if clinically indicated. >0.05 ng/mL: POTENTIAL  MYOCARDIAL INJURY. Repeat  testing in 3-6 hrs if  clinically indicated. NOTE: An increase or decrease  of 30% or more on serial  testing suggests a  clinically important change)  CK, Total  2073  Routine Coag:  10-Dec-15 07:18   Prothrombin 11.9  INR 0.9 (INR reference interval applies to patients on anticoagulant therapy. A single INR therapeutic range for coumarins is not optimal for all indications; however, the suggested range for most indications is 2.0 - 3.0. Exceptions to the INR Reference Range may include: Prosthetic heart valves, acute myocardial infarction, prevention of myocardial infarction, and combinations of aspirin and anticoagulant. The need for a higher or lower target INR must be assessed  individually. Reference: The Pharmacology and Management of the Vitamin K  antagonists: the seventh ACCP Conference on Antithrombotic and Thrombolytic Therapy. [id number].8916ept:126 (3suppl): [id number] HCT value >55% may artifactually increase the PT.  In one study,  the increase was an average of 25%. Reference:  "Effect on Routine and Special Coagulation Testing Values of Citrate Anticoagulant Adjustment in Patients with High HCT Values." American Journal of Clinical Pathology 2006;126:400-405.)  Activated PTT (APTT) 27.0 (A HCT value >55% may artifactually increase the APTT. In one study, the increase was an average of 19%. Reference: "Effect on Routine and Special Coagulation Testing Values of Citrate Anticoagulant Adjustment in Patients with High HCT Values." American Journal of Clinical Pathology 2006;126:400-405.)  Routine Hem:  10-Dec-15 07:18   WBC (CBC)  15.6  RBC (CBC) 5.06  Hemoglobin (CBC) 15.7  Hematocrit (CBC) 47.9  Platelet Count (CBC) 375 (Result(s) reported on 28 Jun 2014 at 07:35AM.)  MCV 95  MCH 31.0  MCHC 32.7  RDW 12.8   EKG:  EKG Interp. by me   Interpretation NSR, 87,  right axis, nonspecific lateral st changes   Radiology Results: XRay:    10-Dec-15 08:11, Chest Portable Single View  Chest Portable Single View   REASON FOR EXAM:    Chest Pain  COMMENTS:       PROCEDURE: DXR - DXR PORTABLE CHEST SINGLE VIEW  - Jun 28 2014  8:11AM     CLINICAL DATA:  49 year old male with sudden onset chest pain  beginning last night, radiating to back, diaphoresis, nonproductive  cough. Initial encounter.    EXAM:  PORTABLE CHEST - 1 VIEW    COMPARISON:  Portable chest 11/17/2009 and earlier.    FINDINGS:  Portable AP upright view at 0801 hrs. Lung volumes are within normal  limits. Normal cardiac size and mediastinal contours. Visualized  tracheal air column is within normal limits. No pneumothorax or  pulmonary edema. No pleural effusion or  consolidation. Questionable  patchy increased opacity at the left lung base, could be artifact.  Elsewhere lung parenchyma within normal limits. No osseous  abnormality identified.     IMPRESSION:  Questionable patchy increased left basilar opacity versus artifact.  Recommend upright PA and lateral views to confirm when possible.      Electronically Signed    By: Lars Pinks M.D.    On: 06/28/2014 08:15         Verified By: Gwenyth Bender. [place] Crane, M.D.,  Cardiac Catherization:    10-Dec-15 08:47, Cardiac Catheterization  Cardiac Catheterization   [facility]  [address] [place], [place] [address]  [phone]     Cardiovascular Catheterization Comprehensive Report     Patient: WILLIAN DONSON  Study date: 06/28/2014  MR number: [id number]  Account number: [id number]     DOB: 04/20/1966  Age: 75 years  Gender: Male  Race: White  Height: 63 in  Weight: 136.6 lb     Diagnostic Cardiologist:  Kathlyn Sacramento, MD     SUMMARY:     -HEMODYNAMICS: Hemodynamic assessment demonstrates mild systemic  hypertension and moderately elevated LVEDP.     -CORONARY CIRCULATION: There was severe 1-vessel coronary artery  disease. Ramus intermedius: There was a 100 % stenosis in the  proximal third of the vessel segment. There was TIMI grade 0 flow  through the vessel (no flow). This is a likely culprit for the  patient's clinical presentation. An intervention was performed.     -CARDIAC STRUCTURES: Analysis of regional contractile function  demonstrated severe anterolateral hypokinesis. Global left  ventricular function was mildly depressed. EF estimated was 40 %.     -1ST LESION INTERVENTIONS: A drug-eluting stent was performed on the  100 % lesion in the ramus intermedius. Following intervention there  was an excellent angiographic appearance with a 0 % residual  stenosis.     -RECOMMENDATIONS: Patient management should include aggressive medical  therapy, a cardiac  rehabilitation program, and smoking cessation.  Recommend dual antiplatelet therapy for at least 1 year. I strongly  advised him to stop Cocaine use.     HEMODYNAMICS: Hemodynamic assessment demonstrates mild systemic  hypertension and moderately elevated LVEDP.     CORONARY CIRCULATION: The coronary circulation is right dominant.  There was severe 1-vessel coronary artery disease. Left main: Normal.  LAD: The vessel was normal sized. Mid LAD: There was a diffuse 50 %  stenosis. 1st diagonal: The vessel was very small sized. 2nd  diagonal: The vessel was very small sized. 3rd diagonal: The vessel  was very small sized. Circumflex: The vessel was small  sized.  Angiography showed minor luminal irregularities. Mid circumflex:  There was a 20 % stenosis. 1st obtuse marginal: The vessel was small  sized. There was a 80 % stenosis in the proximal third of the vessel  segment. 2nd obtuse marginal: The vessel was very small sized. 3rd  obtuse marginal: The vessel was small sized. Angiography showed minor  luminal irregularities. Ramus intermedius: There was a 100 % stenosis  in the proximal third of the vessel segment. There was TIMI grade 0  flow through the vessel (no flow). This is a likely culprit for the  patient's clinical presentation. An intervention was performed. RCA:  The vessel was normal sized. Angiography showed minor luminal  irregularities. Right PDA: The vessel was normal sized. Angiography  showed no evidence of disease. Right posterolateral segment: The  vessel was normal sized. Angiography showed no evidence of disease.  1st posterolateral segment: The vessel was normal sized. Angiography  showed no evidence of disease. 2nd posterolateral segment: The vessel  was medium sized. Angiography showed no evidence of disease.     VENTRICLES: Analysis of regional contractile function demonstrated  severe anterolateral hypokinesis. Global left ventricular function  was mildly  depressed. EF estimated was 40 %.  RECOMMENDATIONS: -Patient management should include aggressive medical  therapy, a cardiac rehabilitation program, and smoking cessation.  Recommend dual antiplatelet therapy for at least 1 year. I strongly  advised him to stopCocaine use.     INDICATIONS: Angina/MI: myocardial infarction with ST elevation  (STEMI), CCS class IV.     HISTORY: No history of previous myocardial infarction. There was no  prior diagnosis of congestive heart failure. The patient has a  history of current cigarette use and a family history of coronary  artery disease. There was no history of congestive heart failure,  cardiogenic shock, diabetes, or cardiac arrest. PRIOR CARDIOVASCULAR  PROCEDURES: No history of valve surgery.     PRIOR DIAGNOSTIC TEST RESULTS: No prior stress test is available.     PROCEDURES PERFORMED: Left heart catheterization with  ventriculography. Intervention on ramus intermedius: drug-eluting  stent.     COMPLICATIONS: No complication occurred during the cath lab visit.     PROCEDURE: The risks and alternatives of the procedures and conscious  sedation were explained to the patient and informed consent was  obtained. The patient was brought to the cath lab and placed on the  table. The planned puncture siteswere prepped and draped in the  usual sterile fashion.     -Right radial artery access. The vessel was accessed, a wire was  threaded into the vessel, and a was advanced over the wire into the  vessel.     -Left heart catheterization. A catheter was advanced to the ascending  aorta. Ventriculography was performed using power injection of  contrast agent.     LESION INTERVENTION: A drug-eluting stent was performed on the 100 %  lesion in the ramus intermedius. Following intervention there was an  excellent angiographic appearance with a 0 % residual stenosis. The  intervention was performed at the site of a prior balloon  angioplasty  and stent. This was an ACC/AHA "non-high risk" lesion for  intervention. There was TIMI 2 flow before the procedure and TIMI 3  flow after the procedure. There was no perforation. There was no  dissection.     -Vessel setup was performed. A 34F IKARI LEFT 3.5 guiding catheter was  used to intubate the vessel.     -Vessel setup was performed.  A Runthrough NS 180cm wire was used to  cross the lesion.     -Balloon angioplasty was performed, using a Rx Trek 2.5 x 5m  balloon, with 1 inflation(s) and a maximum inflation pressure of 8  atm.     -A 2.75 x 277mResolute Integrity drug-eluting stent was placed across  the lesion and deployed at a maximum inflation pressure of 12 atm.     -Balloon angioplasty was performed, using a [place] Trek 3.0 x 2066mballoon, with 1 inflation(s) and a maximum inflation pressure of 14  atm.     PROCEDURE COMPLETION: TIMING: Test started at 09:00. Test concluded at  09:35. RADIATION EXPOSURE: Fluoroscopy time: 5.67 min. Fluoroscopy  dose: 1.184 Gray.  MEDICATIONS GIVEN: Midazolam, 1 mg, IV, at 09:00. Fentanyl, 50 mcg,  IV, at 09:06. Midazolam, 1 mg, IV, at 09:07. Midazolam, 1 mg, IV, at  09:10. Verapamil (Isoptin, Calan, Covera), 2.5 mg, IA, at 09:03.  Nitroglycerin, 200 mcg, intracoronary, at 09:13. Nitroglycerin, 200  mcg, intracoronary, at 09:19. Angiomax Bolus, 9 ml, IV, at 09:10.  Angiomax Drip, infusion rate of 21 ml/hr, IV, at 09:13. Angiomax  Drip, infusion rate of 3.1 ml/hr, IV, at 09:31.  CONTRAST GIVEN: Isovue 180 ml.     Prepared and signed by     MuhKathlyn SacramentoD  Signed 06/28/2014 09:59:16     STUDY DIAGRAM     Angiographic findings  Native coronary lesions:   Mid LAD: Lesion 1: diffuse, 50 % stenosis.   Mid circumflex: Lesion 1: 20 % stenosis.   OM1: Lesion 1: 80 % stenosis.   Ramus intermedius: Lesion 1: 100 % stenosis.  Intervention results  Native coronary lesions:  drug-eluting stent of the 100 % stenosis in  ramus intermedius.  Appearance excellent with 0 % residual stenosis. Stent: 2.75 x 49m66mesolute Integrity drug-eluting.     HEMODYNAMIC TABLES     Pressures:  Baseline  Pressures:  - HR: 97  Pressures:  - Rhythm:  Pressures:  -- Aortic Pressure (S/D/M): 149/89/116  Pressures:  -- Left Ventricle (s/edp): 141/27/--     Outputs:  Baseline  Outputs:  -- CALCULATIONS: Body Surface Area: 1.65  Outputs:  -- CALCULATIONS: Height in cm: 160.00  Outputs:  -- CALCULATIONS: Sex: Male  Outputs:  -- CALCULATIONS: Weight in kg: 62.10  CT:    10-Dec-15 08:56, CT Angiography Chest/Abd/Pelvis w/wo  CT Angiography Chest/Abd/Pelvis w/wo   REASON FOR EXAM:    severe chest pain that radiates to back, elevated   troponin  COMMENTS:       PROCEDURE: CT  - CT ANGIOGRAPHY CHEST/ABD/PELVIS  - Jun 28 2014  8:56AM     CLINICAL DATA:  Severe chest pain with back pain.  Elevated troponin    EXAM:  CT ANGIOGRAPHY CHEST, ABDOMEN AND PELVIS    TECHNIQUE:  Multidetector CT imaging through the chest, abdomen and pelvis was  performed using the standard protocol during bolus administration of  intravenous contrast. Multiplanar reconstructed imagesand MIPs were  obtained and reviewed to evaluate the vascular anatomy.    CONTRAST:  100 mL Isovue 370 IV    COMPARISON:  Chest x-ray 06/28/2014    FINDINGS:  CTA CHEST FINDINGS    Unenhanced images reveal no mural hematoma in the aorta. Negative  for aortic dissection. Negative for aortic aneurysm. Negative for  pulmonary embolism.    Mild cardiac enlargement without pericardial effusion. Left  ventricular enlargement. Mild coronary artery calcification.  The lungs are clear. Negative  for pneumonia or effusion. Negative  for mass or adenopathy. No acute bony abnormality.    Review of the MIP images confirms the above findings.    CTA ABDOMEN AND PELVIS FINDINGS    Arterial phase contrast enhancement. Negative for aortic aneurysm or  dissection. Mild  atherosclerotic irregularity in the abdominal aorta  without significant stenosis. Iliac arteries are patent without  stenosis or dissection. Single renal artery bilaterally widely  patent. SMA and celiac widely patent. Very small IMA is patent.    Liver and spleen are normal. Gallbladder is normal. Pancreas and  kidneys are normal.  Sigmoid diverticulosis without acute inflammation. Negative for  bowel obstruction. Normal appendix.    No free fluid. Negative for adenopathy or mass lesion. No acute  spinal abnormality. Mild degenerative change.    Review of the MIP images confirms the above findings.     IMPRESSION:  Negative for pulmonary embolism. Negative for aortic dissection or  aneurysm.    Mild coronary artery calcification    Lungs are clear without infiltrate or effusion.  Mild atherosclerotic disease in the abdominal aorta without aneurysm  or dissection. No acute arterial abnormality.    Mild sigmoid diverticulosis without acute inflammation.      Electronically Signed    By: Franchot Gallo M.D.    On: 06/28/2014 09:07         Verified By: Truett Perna, M.D.,    Percocet: N/V  Codeine: Itching  Vital Signs/Nurse's Notes: **Vital Signs.:   10-Dec-15 10:10  Vital Signs Type Post-Procedure  Temperature Temperature (F) 98.7  Celsius 37  Temperature Source oral  Pulse Pulse 68  Respirations Respirations 15  Systolic BP Systolic BP 390  Diastolic BP (mmHg) Diastolic BP (mmHg) 96  Mean BP 116  Pulse Ox % Pulse Ox % 99  Pulse Ox Activity Level  At rest  Oxygen Delivery Room Air/ 21 %    Impression 49 year old male with history of polysubstance abuse presented to [facility] this morning with chest pain after developing onset of chest pain radiating to the back the prior evening around 10 PM found to have STEMI.  1. STEMI/CAD: -Cath showed occluded Ramus -Status post PCI to Ramus with 0% residual stenosis -DAPT for at least 12 months (aspirin 81 mg & Effient  10 mg daily) -Must stop smoking and cocaine use (hold b-blocker in the setting of cocaine use) -UDS pending -Lipitor 40 mg - FLP pending -Lisinopril 10 mg -Cardiac rehab  2. Psychosocial issues: -Suggest psych conuslt given patient's comments that he continues to smoke 4-5 packs daily 2/2 his mothers death, that he should not have come in, and that he has no reason to live -Consider sitter -Has threatened to leave AMA  3. HTN: -Lisinopril has been added -Monitor  4. Hyperglycemia: -Check A1C   Electronic Signatures for Addendum Section:  Kathlyn Sacramento (MD) (Signed Addendum 10-Dec-15 16:28)  The patient was seen and examined. Agree with the above. He presented with chest pain since last night. It was severe and radiating to back. ECG shwoed borderline 1 mm of lateral ST elevation. CT showed no dissection. Urgent cath was perfromed and showed an occluded Ramus artery s/p PCI and DES placement.  Continue dual antiplatelet therapy for at least 1 year. OK to use Coreg. Started an ACE I. advised him to stop smoking and Cocaine.   Electronic Signatures: Kathlyn Sacramento (MD)  (Signed 10-Dec-15 16:28)  Co-Signer: General Aspect/Present Illness, Past Medical History, Home Medications, Labs, Allergies  Rise Mu (PA-C)  (Signed 10-Dec-15 10:35)  Authored: General Aspect/Present Illness, History and Physical Exam, Review of System, Family & Social History, Past Medical History, Home Medications, Labs, EKG , Radiology, Allergies, Vital Signs/Nurse's Notes, Impression/Plan   Last Updated: 10-Dec-15 16:28 by Kathlyn Sacramento (MD)

## 2016-10-19 ENCOUNTER — Emergency Department
Admission: EM | Admit: 2016-10-19 | Discharge: 2016-10-19 | Disposition: A | Discharge status: Home / Self Care | Payer: Self-pay | Attending: Student in an Organized Health Care Education/Training Program | Admitting: Student in an Organized Health Care Education/Training Program

## 2016-10-19 ENCOUNTER — Encounter: Payer: Self-pay | Admitting: Emergency Medicine

## 2016-10-19 ENCOUNTER — Other Ambulatory Visit: Payer: Self-pay

## 2016-10-19 ENCOUNTER — Emergency Department: Payer: Self-pay

## 2016-10-19 DIAGNOSIS — F339 Major depressive disorder, recurrent, unspecified: Secondary | ICD-10-CM

## 2016-10-19 DIAGNOSIS — F32A Depression, unspecified: Secondary | ICD-10-CM

## 2016-10-19 DIAGNOSIS — F17210 Nicotine dependence, cigarettes, uncomplicated: Secondary | ICD-10-CM | POA: Insufficient documentation

## 2016-10-19 DIAGNOSIS — F1994 Other psychoactive substance use, unspecified with psychoactive substance-induced mood disorder: Secondary | ICD-10-CM

## 2016-10-19 DIAGNOSIS — I2510 Atherosclerotic heart disease of native coronary artery without angina pectoris: Secondary | ICD-10-CM | POA: Insufficient documentation

## 2016-10-19 DIAGNOSIS — R45851 Suicidal ideations: Secondary | ICD-10-CM

## 2016-10-19 DIAGNOSIS — Z79899 Other long term (current) drug therapy: Secondary | ICD-10-CM | POA: Insufficient documentation

## 2016-10-19 DIAGNOSIS — F341 Dysthymic disorder: Secondary | ICD-10-CM

## 2016-10-19 DIAGNOSIS — I10 Essential (primary) hypertension: Secondary | ICD-10-CM | POA: Insufficient documentation

## 2016-10-19 DIAGNOSIS — I252 Old myocardial infarction: Secondary | ICD-10-CM | POA: Insufficient documentation

## 2016-10-19 DIAGNOSIS — Z7982 Long term (current) use of aspirin: Secondary | ICD-10-CM | POA: Insufficient documentation

## 2016-10-19 DIAGNOSIS — F1410 Cocaine abuse, uncomplicated: Secondary | ICD-10-CM

## 2016-10-19 DIAGNOSIS — F329 Major depressive disorder, single episode, unspecified: Secondary | ICD-10-CM

## 2016-10-19 DIAGNOSIS — R079 Chest pain, unspecified: Secondary | ICD-10-CM

## 2016-10-19 LAB — BASIC METABOLIC PANEL
Anion gap: 5 (ref 5–15)
BUN: 13 mg/dL (ref 6–20)
CHLORIDE: 108 mmol/L (ref 101–111)
CO2: 27 mmol/L (ref 22–32)
Calcium: 9.1 mg/dL (ref 8.9–10.3)
Creatinine, Ser: 0.9 mg/dL (ref 0.61–1.24)
GFR calc Af Amer: >60 mL/min (ref >60)
GLUCOSE: 125 mg/dL — AB (ref 65–99)
POTASSIUM: 4 mmol/L (ref 3.5–5.1)
SODIUM: 140 mmol/L (ref 135–145)

## 2016-10-19 LAB — CBC
HEMATOCRIT: 41.1 % (ref 40.0–52.0)
HEMOGLOBIN: 14.3 g/dL (ref 13.0–18.0)
MCH: 31 pg (ref 26.0–34.0)
MCHC: 34.9 g/dL (ref 32.0–36.0)
MCV: 88.9 fL (ref 80.0–100.0)
Platelets: 387 10*3/uL (ref 150–440)
RBC: 4.63 MIL/uL (ref 4.40–5.90)
RDW: 13.2 % (ref 11.5–14.5)
WBC: 8.2 10*3/uL (ref 3.8–10.6)

## 2016-10-19 LAB — ACETAMINOPHEN LEVEL: Acetaminophen (Tylenol), Serum: <10 ug/mL — ABNORMAL LOW (ref 10–30)

## 2016-10-19 LAB — URINALYSIS, COMPLETE (UACMP) WITH MICROSCOPIC
BACTERIA UA: NONE SEEN
BILIRUBIN URINE: NEGATIVE
GLUCOSE, UA: NEGATIVE mg/dL
HGB URINE DIPSTICK: NEGATIVE
KETONES UR: NEGATIVE mg/dL
Leukocytes, UA: NEGATIVE
NITRITE: NEGATIVE
PROTEIN: NEGATIVE mg/dL
Specific Gravity, Urine: 1.023 (ref 1.005–1.030)
pH: 5 (ref 5.0–8.0)

## 2016-10-19 LAB — ETHANOL

## 2016-10-19 LAB — URINE DRUG SCREEN, QUALITATIVE (ARMC ONLY)
Amphetamines, Ur Screen: NOT DETECTED
BARBITURATES, UR SCREEN: NOT DETECTED
Benzodiazepine, Ur Scrn: NOT DETECTED
CANNABINOID 50 NG, UR ~~LOC~~: NOT DETECTED
COCAINE METABOLITE, UR ~~LOC~~: POSITIVE — AB
MDMA (ECSTASY) UR SCREEN: NOT DETECTED
Methadone Scn, Ur: NOT DETECTED
OPIATE, UR SCREEN: NOT DETECTED
PHENCYCLIDINE (PCP) UR S: NOT DETECTED
Tricyclic, Ur Screen: NOT DETECTED

## 2016-10-19 LAB — SALICYLATE LEVEL: Salicylate Lvl: <7 mg/dL (ref 2.8–30.0)

## 2016-10-19 LAB — TROPONIN I: Troponin I: <0.03 ng/mL (ref <0.03)

## 2016-10-19 NOTE — ED Triage Notes (Signed)
Pt presents with chest pain radiating into back between shoulders, started last night while he was making a sandwhich. Describes as jabbing in nature with some shortness of breath. Pt also wants to be treated for depression, pt states as of today he will be homeless and is depressed. Pt with hx of depression.

## 2016-10-19 NOTE — ED Provider Notes (Signed)
[facility] Emergency Department Provider Note    First MD Initiated Contact with Patient 10/19/16 1329     (approximate)  I have reviewed the triage vital signs and the nursing notes.   HISTORY  Chief Complaint Chest Pain and Depression    HPI Marcus [name] is a 51 y.o. male presents with chief complaint of several days of intermittent chest pain as well as feeling depressed and hopeless with thoughts of wanting to jump off a bridge. Patient states he had these episodes of chest pain that occur when speaking about becoming hopeless. States he does have a history of heart attack but states that this feels different. Denies any diaphoresis but does feel short of breath. States that he feels numbness and tingling down in the bilateral upper extremities during the chest pain. He is able to calm himself down after about 30 minutes. States that these are becoming more frequent knowing that he's can become homeless tomorrow. States he has been having increasing thoughts of jumping off a bridge. Denies any true desire to commit suicide right now but is feeling more difficulty suppressing these thoughts.   Past Medical History:  Diagnosis Date  . CAD in native artery    a. 06/28/2014: STEMI, cath LM nl, mLAD 50%, mLCx 20%, OM1 small in size 80%, ramus 100% s/p PCI/DES 0%, EF 40%   . Cocaine abuse   . Depression   . H/O gastric ulcer   . Hyperglycemia    a. A1C 5.6%  . Hypertension   . Ischemic cardiomyopathy    a. EF 40% by cath 06/28/2014  . MI (myocardial infarction)   . Polysubstance abuse    a. cocaine, tobacco, and alcohol   . Tobacco abuse    Family History  Problem Relation Age of Onset  . Heart failure Mother    Past Surgical History:  Procedure Laterality Date  . CARDIAC CATHETERIZATION  06/28/2014   x1 stent  . NISSEN FUNDOPLICATION    . stomach ulcer surgery     Patient Active Problem List   Diagnosis Date Noted  . Hypertension   .  CAD in native artery   . Ischemic cardiomyopathy   . Polysubstance abuse   . Depression   . Hyperglycemia       Prior to Admission medications   Medication Sig Start Date End Date Taking? Authorizing Provider  aspirin 81 MG tablet Take 81 mg by mouth daily.    Historical Provider, MD  prasugrel (EFFIENT) 10 MG TABS tablet Take 1 tablet (10 mg total) by mouth daily. 07/10/14   Rise Mu, PA-C    Allergies Codeine and Percocet [oxycodone-acetaminophen]    Social History Social History  Substance Use Topics  . Smoking status: Current Every Day Smoker    Packs/day: 5.00    Years: 38.00    Types: Cigarettes  . Smokeless tobacco: Never Used  . Alcohol use 0.0 oz/week    Review of Systems Patient denies headaches, rhinorrhea, blurry vision, numbness, shortness of breath, chest pain, edema, cough, abdominal pain, nausea, vomiting, diarrhea, dysuria, fevers, rashes or hallucinations unless otherwise stated above in HPI. ____________________________________________   PHYSICAL EXAM:  VITAL SIGNS: Vitals:   10/19/16 1234  BP: (!) 179/89  Pulse: 80  Resp: 20  Temp: 97.7 F (36.5 C)    Constitutional: Alert and oriented. Well appearing and in no acute distress. Eyes: Conjunctivae are normal. PERRL. EOMI. Head: Atraumatic. Nose: No congestion/rhinnorhea. Mouth/Throat: Mucous membranes are moist.  Oropharynx non-erythematous. Neck: No stridor. Painless ROM. No cervical spine tenderness to palpation Hematological/Lymphatic/Immunilogical: No cervical lymphadenopathy. Cardiovascular: Normal rate, regular rhythm. Grossly normal heart sounds.  Good peripheral circulation. Respiratory: Normal respiratory effort.  No retractions. Lungs CTAB. Gastrointestinal: Soft and nontender. No distention. No abdominal bruits. No CVA tenderness. Genitourinary:  Musculoskeletal: No lower extremity tenderness nor edema.  No joint effusions. Neurologic:  Normal speech and language. No gross  focal neurologic deficits are appreciated. No gait instability. Skin:  Skin is warm, dry and intact. No rash noted. Psychiatric: withdrawn, good eye contact, figiting with hands, states he feels hopeless and depressed, organized thought process ____________________________________________   LABS (all labs ordered are listed, but only abnormal results are displayed)  Results for orders placed or performed during the hospital encounter of 10/19/16 (from the past 24 hour(s))  Basic metabolic panel     Status: Abnormal   Collection Time: 10/19/16 12:38 PM  Result Value Ref Range   Sodium 140 135 - 145 mmol/L   Potassium 4.0 3.5 - 5.1 mmol/L   Chloride 108 101 - 111 mmol/L   CO2 27 22 - 32 mmol/L   Glucose, Bld 125 (H) 65 - 99 mg/dL   BUN 13 6 - 20 mg/dL   Creatinine, Ser 0.90 0.61 - 1.24 mg/dL   Calcium 9.1 8.9 - 10.3 mg/dL   GFR calc non Af Amer >60 >60 mL/min   GFR calc Af Amer >60 >60 mL/min   Anion gap 5 5 - 15  CBC     Status: None   Collection Time: 10/19/16 12:38 PM  Result Value Ref Range   WBC 8.2 3.8 - 10.6 K/uL   RBC 4.63 4.40 - 5.90 MIL/uL   Hemoglobin 14.3 13.0 - 18.0 g/dL   HCT 41.1 40.0 - 52.0 %   MCV 88.9 80.0 - 100.0 fL   MCH 31.0 26.0 - 34.0 pg   MCHC 34.9 32.0 - 36.0 g/dL   RDW 13.2 11.5 - 14.5 %   Platelets 387 150 - 440 K/uL  Troponin I     Status: None   Collection Time: 10/19/16 12:38 PM  Result Value Ref Range   Troponin I <0.03 <0.03 ng/mL  Urinalysis, Complete w Microscopic     Status: Abnormal   Collection Time: 10/19/16 12:38 PM  Result Value Ref Range   Color, Urine YELLOW (A) YELLOW   APPearance CLEAR (A) CLEAR   Specific Gravity, Urine 1.023 1.005 - 1.030   pH 5.0 5.0 - 8.0   Glucose, UA NEGATIVE NEGATIVE mg/dL   Hgb urine dipstick NEGATIVE NEGATIVE   Bilirubin Urine NEGATIVE NEGATIVE   Ketones, ur NEGATIVE NEGATIVE mg/dL   Protein, ur NEGATIVE NEGATIVE mg/dL   Nitrite NEGATIVE NEGATIVE   Leukocytes, UA NEGATIVE NEGATIVE   RBC / HPF 0-5  0 - 5 RBC/hpf   WBC, UA 0-5 0 - 5 WBC/hpf   Bacteria, UA NONE SEEN NONE SEEN   Squamous Epithelial / LPF 0-5 (A) NONE SEEN   Mucous PRESENT   Ethanol     Status: None   Collection Time: 10/19/16 12:38 PM  Result Value Ref Range   Alcohol, Ethyl (B) <5 <5 mg/dL  Salicylate level     Status: None   Collection Time: 10/19/16 12:38 PM  Result Value Ref Range   Salicylate Lvl <6.6 2.8 - 30.0 mg/dL  Acetaminophen level     Status: Abnormal   Collection Time: 10/19/16 12:38 PM  Result Value Ref Range   Acetaminophen (  Tylenol), Serum <10 (L) 10 - 30 ug/mL   ____________________________________________  EKG My review and personal interpretation at Time: 12:30   Indication: chest pain  Rate: 75  Rhythm: sinus Axis: normal Other: no st elevastions or depressions, normal intervals ____________________________________________  RADIOLOGY  I personally reviewed all radiographic images ordered to evaluate for the above acute complaints and reviewed radiology reports and findings.  These findings were personally discussed with the patient.  Please see medical record for radiology report.  ____________________________________________   PROCEDURES  Procedure(s) performed:  Procedures    Critical Care performed: no ____________________________________________   INITIAL IMPRESSION / ASSESSMENT AND PLAN / ED COURSE  Pertinent labs & imaging results that were available during my care of the patient were reviewed by me and considered in my medical decision making (see chart for details).  DDX: acs, dysrhythmia, bronchitis, .Psychosis, delirium, medication effect, noncompliance, polysubstance abuse, Si, Hi, depression    JACQUISE RARICK is a 51 y.o. who presents to the ED with for evaluation of chest pain, depression and SI.  Patient has psych history of depression and anxiety with polysubstance abuse and CAD.  Less consistent with ACS based on his description of events as well as normal  EKG and negative troponin. Most likely secondary to underlying anxiety..  Laboratory testing was ordered to evaluation for underlying electrolyte derangement or signs of underlying organic pathology to explain today's presentation.  Based on history and physical and laboratory evaluation, it appears that the patient's presentation is 2/2 underlying psychiatric disorder and would benefit from psychiatric evaluation.         ____________________________________________   FINAL CLINICAL IMPRESSION(S) / ED DIAGNOSES  Final diagnoses:  Chest pain, unspecified type  Depression, unspecified depression type  Passive suicidal ideations      NEW MEDICATIONS STARTED DURING THIS VISIT:  New Prescriptions   No medications on file     Note:  This document was prepared using Dragon voice recognition software and may include unintentional dictation errors.    Merlyn Lot, MD 10/19/16 1556

## 2016-10-19 NOTE — ED Notes (Signed)
Dr.Clapacs at bedside  

## 2016-10-19 NOTE — Consult Note (Signed)
[facility] Face-to-Face Psychiatry Consult   Reason for Consult:  Consult for 51 year old man with a history of alcohol and cocaine abuse. Came into the emergency room initially reporting chest pain later saying he was having suicidal thoughts. Referring Physician:  Quale Patient Identification: Marcus [name] MRN:  [id number] Principal Diagnosis: Substance induced mood disorder [facility]) Diagnosis:   Patient Active Problem List   Diagnosis Date Noted  . Substance induced mood disorder ([place]) [F19.94] 10/19/2016  . Cocaine abuse [F14.10] 10/19/2016  . Dysthymia [F34.1] 10/19/2016  . Hypertension [I10]   . CAD in native artery [I25.10]   . Ischemic cardiomyopathy [I25.5]   . Polysubstance abuse [F19.10]   . Depression [F32.9]   . Hyperglycemia [R73.9]     Total Time spent with patient: 1 hour  Subjective:   Marcus [name] is a 51 y.o. male patient admitted with "I thought that I might need some help".  HPI:  Patient interviewed. Chart reviewed. 51 year old man came into the hospital reporting chest pain and then reported having suicidal thoughts. Patient is very vague in his history with me. He tells me that he's been feeling stressed out but when asked how long this is been going out on this he says it's been many years. Also says his mood has been terrible but again says this is been the case for years. He has chronic suicidal ideation but says it has not changed its character in years. He tells me he was having thoughts of jumping off a bridge but has made no attempt to try and kill himself and admits that he is too afraid to try to act on it. He is acutely stressed because his brother threw him out of the house this morning and the patient now has no place to live. Patient claims to have no idea why his brother would've done this claiming that there was nothing that he had done to provoke it. Patient admits that he has been using cocaine but tends to minimize it. Denies acute alcohol abuse. Not  currently getting any outpatient mental health follow-up despite his complaints of chronic depression and anxiety.  Social history: Not working. Had been living with his brother but appears to of been thrown out and now has no place to stay.  Medical history: Does have a history of coronary artery disease.  Substance abuse history: History of multiple drug use including cocaine and alcohol abuse. Admits that he's been using crack cocaine recently.  Past Psychiatric History: Patient has a history of previous hospital visits with similar symptoms no history of actual suicide attempts in the past. No history of violence. Has not been compliant with recommended outpatient treatment previously. No history of response to antidepressants.  Risk to Self: Suicidal Ideation: No-Not Currently/Within Last 6 Months Suicidal Intent: No-Not Currently/Within Last 6 Months Is patient at risk for suicide?: No Suicidal Plan?: No-Not Currently/Within Last 6 Months Access to Means: No What has been your use of drugs/alcohol within the last 12 months?: Cocaine How many times?: 3 Other Self Harm Risks: Active Addiction Triggers for Past Attempts: Other (Comment), Other personal contacts (Active Addiction) Intentional Self Injurious Behavior: None Risk to Others: Homicidal Ideation: No Thoughts of Harm to Others: No Current Homicidal Intent: No Current Homicidal Plan: No Access to Homicidal Means: No Identified Victim: Reports of none History of harm to others?: No Assessment of Violence: None Noted Violent Behavior Description: Reports of none Does patient have access to weapons?: No Criminal Charges Pending?: No Does patient  have a court date: No Prior Inpatient Therapy: Prior Inpatient Therapy: Yes Prior Therapy Dates: 11/2009, 03/2009, 10/2008 Prior Therapy Facilty/Provider(s): [facility]  Reason for Treatment: Depression and Substance Use Prior Outpatient Therapy: Prior Outpatient Therapy: No Prior  Therapy Dates: Reports of None Prior Therapy Facilty/Provider(s): Reports of None Reason for Treatment: Reports of None Does patient have an ACCT team?: No Does patient have Intensive In-House Services?  : No Does patient have [facility] services? : No Does patient have P4CC services?: No  Past Medical History:  Past Medical History:  Diagnosis Date  . CAD in native artery    a. 06/28/2014: STEMI, cath LM nl, mLAD 50%, mLCx 20%, OM1 small in size 80%, ramus 100% s/p PCI/DES 0%, EF 40%   . Cocaine abuse   . Depression   . H/O gastric ulcer   . Hyperglycemia    a. A1C 5.6%  . Hypertension   . Ischemic cardiomyopathy    a. EF 40% by cath 06/28/2014  . MI (myocardial infarction)   . Polysubstance abuse    a. cocaine, tobacco, and alcohol   . Tobacco abuse     Past Surgical History:  Procedure Laterality Date  . CARDIAC CATHETERIZATION  06/28/2014   x1 stent  . NISSEN FUNDOPLICATION    . stomach ulcer surgery     Family History:  Family History  Problem Relation Age of Onset  . Heart failure Mother    Family Psychiatric  History: Denies knowing of any family history Social History:  History  Alcohol Use  . 0.0 oz/week     History  Drug Use No    Social History   Social History  . Marital status: Single    Spouse name: N/A  . Number of children: N/A  . Years of education: N/A   Social History Main Topics  . Smoking status: Current Every Day Smoker    Packs/day: 5.00    Years: 38.00    Types: Cigarettes  . Smokeless tobacco: Never Used  . Alcohol use 0.0 oz/week  . Drug use: No  . Sexual activity: Not Asked   Other Topics Concern  . None   Social History Narrative  . None   Additional Social History:    Allergies:   Allergies  Allergen Reactions  . Codeine   . Percocet [Oxycodone-Acetaminophen]     Labs:  Results for orders placed or performed during the hospital encounter of 10/19/16 (from the past 48 hour(s))  Basic metabolic panel      Status: Abnormal   Collection Time: 10/19/16 12:38 PM  Result Value Ref Range   Sodium 140 135 - 145 mmol/L   Potassium 4.0 3.5 - 5.1 mmol/L   Chloride 108 101 - 111 mmol/L   CO2 27 22 - 32 mmol/L   Glucose, Bld 125 (H) 65 - 99 mg/dL   BUN 13 6 - 20 mg/dL   Creatinine, Ser 0.90 0.61 - 1.24 mg/dL   Calcium 9.1 8.9 - 10.3 mg/dL   GFR calc non Af Amer >60 >60 mL/min   GFR calc Af Amer >60 >60 mL/min    Comment: (NOTE) The eGFR has been calculated using the CKD EPI equation. This calculation has not been validated in all clinical situations. eGFR's persistently <60 mL/min signify possible Chronic Kidney Disease.    Anion gap 5 5 - 15  CBC     Status: None   Collection Time: 10/19/16 12:38 PM  Result Value Ref Range   WBC 8.2 3.8 -  10.6 K/uL   RBC 4.63 4.40 - 5.90 MIL/uL   Hemoglobin 14.3 13.0 - 18.0 g/dL   HCT 41.1 40.0 - 52.0 %   MCV 88.9 80.0 - 100.0 fL   MCH 31.0 26.0 - 34.0 pg   MCHC 34.9 32.0 - 36.0 g/dL   RDW 13.2 11.5 - 14.5 %   Platelets 387 150 - 440 K/uL  Troponin I     Status: None   Collection Time: 10/19/16 12:38 PM  Result Value Ref Range   Troponin I <0.03 <0.03 ng/mL  Urinalysis, Complete w Microscopic     Status: Abnormal   Collection Time: 10/19/16 12:38 PM  Result Value Ref Range   Color, Urine YELLOW (A) YELLOW   APPearance CLEAR (A) CLEAR   Specific Gravity, Urine 1.023 1.005 - 1.030   pH 5.0 5.0 - 8.0   Glucose, UA NEGATIVE NEGATIVE mg/dL   Hgb urine dipstick NEGATIVE NEGATIVE   Bilirubin Urine NEGATIVE NEGATIVE   Ketones, ur NEGATIVE NEGATIVE mg/dL   Protein, ur NEGATIVE NEGATIVE mg/dL   Nitrite NEGATIVE NEGATIVE   Leukocytes, UA NEGATIVE NEGATIVE   RBC / HPF 0-5 0 - 5 RBC/hpf   WBC, UA 0-5 0 - 5 WBC/hpf   Bacteria, UA NONE SEEN NONE SEEN   Squamous Epithelial / LPF 0-5 (A) NONE SEEN   Mucous PRESENT   Ethanol     Status: None   Collection Time: 10/19/16 12:38 PM  Result Value Ref Range   Alcohol, Ethyl (B) <5 <5 mg/dL    Comment:         LOWEST DETECTABLE LIMIT FOR SERUM ALCOHOL IS 5 mg/dL FOR MEDICAL PURPOSES ONLY   Salicylate level     Status: None   Collection Time: 10/19/16 12:38 PM  Result Value Ref Range   Salicylate Lvl <2.7 2.8 - 30.0 mg/dL  Acetaminophen level     Status: Abnormal   Collection Time: 10/19/16 12:38 PM  Result Value Ref Range   Acetaminophen (Tylenol), Serum <10 (L) 10 - 30 ug/mL    Comment:        THERAPEUTIC CONCENTRATIONS VARY SIGNIFICANTLY. A RANGE OF 10-30 ug/mL MAY BE AN EFFECTIVE CONCENTRATION FOR MANY PATIENTS. HOWEVER, SOME ARE BEST TREATED AT CONCENTRATIONS OUTSIDE THIS RANGE. ACETAMINOPHEN CONCENTRATIONS >150 ug/mL AT 4 HOURS AFTER INGESTION AND >50 ug/mL AT 12 HOURS AFTER INGESTION ARE OFTEN ASSOCIATED WITH TOXIC REACTIONS.   Urine Drug Screen, Qualitative     Status: Abnormal   Collection Time: 10/19/16 12:38 PM  Result Value Ref Range   Tricyclic, Ur Screen NONE DETECTED NONE DETECTED   Amphetamines, Ur Screen NONE DETECTED NONE DETECTED   MDMA (Ecstasy)Ur Screen NONE DETECTED NONE DETECTED   Cocaine Metabolite,Ur [place] POSITIVE (A) NONE DETECTED   Opiate, Ur Screen NONE DETECTED NONE DETECTED   Phencyclidine (PCP) Ur S NONE DETECTED NONE DETECTED   Cannabinoid 50 Ng, Ur [place] NONE DETECTED NONE DETECTED   Barbiturates, Ur Screen NONE DETECTED NONE DETECTED   Benzodiazepine, Ur Scrn NONE DETECTED NONE DETECTED   Methadone Scn, Ur NONE DETECTED NONE DETECTED    Comment: (NOTE) 078  Tricyclics, urine               Cutoff 1000 ng/mL 200  Amphetamines, urine             Cutoff 1000 ng/mL 300  MDMA (Ecstasy), urine           Cutoff 500 ng/mL 400  Cocaine Metabolite, urine  Cutoff 300 ng/mL 500  Opiate, urine                   Cutoff 300 ng/mL 600  Phencyclidine (PCP), urine      Cutoff 25 ng/mL 700  Cannabinoid, urine              Cutoff 50 ng/mL 800  Barbiturates, urine             Cutoff 200 ng/mL 900  Benzodiazepine, urine           Cutoff 200 ng/mL 1000  Methadone, urine                Cutoff 300 ng/mL 1100 1200 The urine drug screen provides only a preliminary, unconfirmed 1300 analytical test result and should not be used for non-medical 1400 purposes. Clinical consideration and professional judgment should 1500 be applied to any positive drug screen result due to possible 1600 interfering substances. A more specific alternate chemical method 1700 must be used in order to obtain a confirmed analytical result.  1800 Gas chromato graphy / mass spectrometry (GC/MS) is the preferred 1900 confirmatory method.     No current facility-administered medications for this encounter.    Current Outpatient Prescriptions  Medication Sig Dispense Refill  . aspirin 81 MG tablet Take 81 mg by mouth daily.    . prasugrel (EFFIENT) 10 MG TABS tablet Take 1 tablet (10 mg total) by mouth daily. (Patient not taking: Reported on 10/19/2016) 30 tablet 12    Musculoskeletal: Strength & Muscle Tone: within normal limits Gait & Station: normal Patient leans: N/A  Psychiatric Specialty Exam: Physical Exam  Nursing note and vitals reviewed. Constitutional: He appears well-developed and well-nourished.  HENT:  Head: Normocephalic and atraumatic.  Eyes: Conjunctivae are normal. Pupils are equal, round, and reactive to light.  Neck: Normal range of motion.  Cardiovascular: Regular rhythm and normal heart sounds.   Respiratory: Effort normal. No respiratory distress.  GI: Soft.  Musculoskeletal: Normal range of motion.  Neurological: He is alert.  Skin: Skin is warm and dry.  Psychiatric: His speech is delayed. He is withdrawn. Cognition and memory are normal. He expresses impulsivity. He exhibits a depressed mood. He expresses suicidal ideation. He expresses no suicidal plans.    Review of Systems  Constitutional: Negative.   HENT: Negative.   Eyes: Negative.   Respiratory: Negative.   Cardiovascular: Negative.   Gastrointestinal: Negative.    Musculoskeletal: Negative.   Skin: Negative.   Neurological: Negative.   Psychiatric/Behavioral: Positive for depression, memory loss, substance abuse and suicidal ideas. Negative for hallucinations. The patient has insomnia. The patient is not nervous/anxious.     Blood pressure (!) 143/88, pulse 87, temperature 97.6 F (36.4 C), temperature source Oral, resp. rate 16, weight 60.3 kg (133 lb), SpO2 98 %.Body mass index is 23.56 kg/m.  General Appearance: Casual  Eye Contact:  Fair  Speech:  Slow  Volume:  Decreased  Mood:  Dysphoric  Affect:  Congruent  Thought Process:  Goal Directed  Orientation:  Full (Time, Place, and Person)  Thought Content:  Tangential  Suicidal Thoughts:  Yes.  without intent/plan  Homicidal Thoughts:  No  Memory:  Immediate;   Fair Recent;   Fair Remote;   Fair  Judgement:  Impaired  Insight:  Shallow  Psychomotor Activity:  Decreased  Concentration:  Concentration: Fair  Recall:  [employer] of Knowledge:  Fair  Language:  Fair  Akathisia:  No  Handed:  Right  AIMS (if indicated):     Assets:  Desire for Improvement Resilience  ADL's:  Intact  Cognition:  WNL  Sleep:        Treatment Plan Summary: Plan 51 year old man with a history of substance abuse presents to the hospital with suicidal ideation without any intention or plan of acting on it. Symptoms are no different than they have been for years. Has not been compliant with recommended outpatient treatment previously and has not benefited from previous hospital level treatment. Does not meet commitment criteria right now and does not require inpatient treatment. Supportive counseling completed with encouragement of the patient to engage himself in outpatient mental health and substance abuse treatment and he will be given a referral to [place]. Case reviewed with emergency room doctor. Patient can otherwise be released from the emergency room.  Disposition: Patient does not meet criteria for  psychiatric inpatient admission. Supportive therapy provided about ongoing stressors.  Alethia Berthold, MD 10/19/2016 8:26 PM

## 2016-10-19 NOTE — Discharge Instructions (Signed)
You have been seen in the Emergency Department (ED)  today for psychiatric issues.  You have been evaluated by the behavioral medicine specialists and are being referred to: ° °[facility] °[address] °[place], [place] [address] °Phone:  [phone] or [phone] ° °Open Access:   °Walk-in ASSESSMENT hours, M-W-F, 8:00am - 3:00pm °Advanced Acess CRISIS:  M-F, 8:00am - 8:00pm °Outpatient Services Office Hours:  M-F, 8:00am - 5:00pm ° °Please return to the Emergency Department (ED)  immediately if you have ANY thoughts of hurting yourself or anyone else, so that we may help you. ° °Please avoid alcohol and drug use. ° °Follow up with your doctor and/or therapist as soon as possible regarding today's ED  visit.   Please follow up any other recommendations and clinic appointments provided by the psychiatry team that saw you in the Emergency Department. ° °

## 2016-10-19 NOTE — BH Assessment (Signed)
Assessment Note  Marcus [name] is an 51 y.o. male who presents to the ER due to having thoughts of ending his life. He further reports he is homeless because he and his brother had an argument. Thus, he no longer want to live with him. "He woke me up because I was snoring too loud. He woke me up and start cussing at me. I can't do it no more. I can't stay there."  Patient reports of having three suicide attempts in the past. Attempts includes; medication overdose, hanging and cutting. When asked if he currently have a plan to end his life, he stated, "I don't know."  During the interview, the patient was calm, cooperative and pleasant.   Patient admits to cocaine use. He denies current involvement with the legal system.   Diagnosis: Depression and Substance Use Disorder  Past Medical History:  Past Medical History:  Diagnosis Date  . CAD in native artery    a. 06/28/2014: STEMI, cath LM nl, mLAD 50%, mLCx 20%, OM1 small in size 80%, ramus 100% s/p PCI/DES 0%, EF 40%   . Cocaine abuse   . Depression   . H/O gastric ulcer   . Hyperglycemia    a. A1C 5.6%  . Hypertension   . Ischemic cardiomyopathy    a. EF 40% by cath 06/28/2014  . MI (myocardial infarction)   . Polysubstance abuse    a. cocaine, tobacco, and alcohol   . Tobacco abuse     Past Surgical History:  Procedure Laterality Date  . CARDIAC CATHETERIZATION  06/28/2014   x1 stent  . NISSEN FUNDOPLICATION    . stomach ulcer surgery      Family History:  Family History  Problem Relation Age of Onset  . Heart failure Mother     Social History:  reports that he has been smoking Cigarettes.  He has a 190.00 pack-year smoking history. He has never used smokeless tobacco. He reports that he drinks alcohol. He reports that he does not use drugs.  Additional Social History:  Alcohol / Drug Use Pain Medications: See PTA Prescriptions: See PTA Over the Counter: See PTA History of alcohol / drug use?: Yes Longest period  of sobriety (when/how long): Unable to quantify Negative Consequences of Use: Financial, Personal relationships, Work / School Substance #1 Name of Substance 1: Cocaine 1 - Age of First Use: 14 1 - Amount (size/oz): "1 gram" 1 - Frequency: "Once a day" 1 - Duration: "Using like a month" 1 - Last Use / Amount: 10/18/2016  CIWA: CIWA-Ar BP: (!) 143/88 Pulse Rate: 87 COWS:    Allergies:  Allergies  Allergen Reactions  . Codeine   . Percocet [Oxycodone-Acetaminophen]     Home Medications:  (Not in a hospital admission)  OB/GYN Status:  No LMP for male patient.  General Assessment Data Location of Assessment: [facility] ED TTS Assessment: In system Is this a Tele or Face-to-Face Assessment?: Face-to-Face Is this an Initial Assessment or a Re-assessment for this encounter?: Initial Assessment Marital status: Single Maiden name: n/a Is patient pregnant?: No Pregnancy Status: No Living Arrangements: Alone Can pt return to current living arrangement?: Yes Admission Status: Voluntary Is patient capable of signing voluntary admission?: Yes Referral Source: Self/Family/Friend Insurance type: n/a  Medical Screening Exam ([place]) Medical Exam completed: Yes  Crisis Care Plan Living Arrangements: Alone Legal Guardian: Other: (Self) Name of Psychiatrist: Reports of none Name of Therapist: Reports of none  Education Status Is patient currently in school?:  No Current Grade: n/a Highest grade of school patient has completed: n/a Name of school: n/a Contact person: n/a  Risk to self with the past 6 months Suicidal Ideation: No-Not Currently/Within Last 6 Months Has patient been a risk to self within the past 6 months prior to admission? : No Suicidal Intent: No-Not Currently/Within Last 6 Months Has patient had any suicidal intent within the past 6 months prior to admission? : No Is patient at risk for suicide?: No Suicidal Plan?: No-Not Currently/Within Last 6  Months Has patient had any suicidal plan within the past 6 months prior to admission? : Yes Access to Means: No What has been your use of drugs/alcohol within the last 12 months?: Cocaine Previous Attempts/Gestures: Yes How many times?: 3 Other Self Harm Risks: Active Addiction Triggers for Past Attempts: Other (Comment), Other personal contacts (Active Addiction) Intentional Self Injurious Behavior: None Family Suicide History: Unknown Recent stressful life event(s): Other (Comment), Conflict (Comment), Financial Problems, Job Loss (Active Addiction) Persecutory voices/beliefs?: No Depression: Yes Depression Symptoms: Feeling angry/irritable, Feeling worthless/self pity, Loss of interest in usual pleasures, Guilt, Fatigue, Isolating, Tearfulness Substance abuse history and/or treatment for substance abuse?: Yes Suicide prevention information given to non-admitted patients: Not applicable  Risk to Others within the past 6 months Homicidal Ideation: No Does patient have any lifetime risk of violence toward others beyond the six months prior to admission? : No Thoughts of Harm to Others: No Current Homicidal Intent: No Current Homicidal Plan: No Access to Homicidal Means: No Identified Victim: Reports of none History of harm to others?: No Assessment of Violence: None Noted Violent Behavior Description: Reports of none Does patient have access to weapons?: No Criminal Charges Pending?: No Does patient have a court date: No Is patient on probation?: No  Psychosis Hallucinations: None noted Delusions: None noted  Mental Status Report Appearance/Hygiene: Unremarkable Eye Contact: Fair Motor Activity: Freedom of movement, Unremarkable Speech: Logical/coherent Level of Consciousness: Alert Mood: Depressed, Anxious, Pleasant Affect: Anxious, Appropriate to circumstance, Depressed Anxiety Level: Minimal Thought Processes: Coherent, Relevant Judgement: Unimpaired Orientation:  Person, Place, Time, Situation, Appropriate for developmental age Obsessive Compulsive Thoughts/Behaviors: Minimal  Cognitive Functioning Concentration: Normal Memory: Recent Intact, Remote Intact IQ: Average Insight: Fair Impulse Control: Fair Appetite: Good Weight Loss: 0 Weight Gain: 0 Sleep: No Change Total Hours of Sleep: 4 Vegetative Symptoms: None  ADLScreening [facility] Assessment Services) Patient's cognitive ability adequate to safely complete daily activities?: Yes Patient able to express need for assistance with ADLs?: Yes Independently performs ADLs?: Yes (appropriate for developmental age)  Prior Inpatient Therapy Prior Inpatient Therapy: Yes Prior Therapy Dates: 11/2009, 03/2009, 10/2008 Prior Therapy Facilty/Provider(s): [facility]  Reason for Treatment: Depression and Substance Use  Prior Outpatient Therapy Prior Outpatient Therapy: No Prior Therapy Dates: Reports of None Prior Therapy Facilty/Provider(s): Reports of None Reason for Treatment: Reports of None Does patient have an ACCT team?: No Does patient have Intensive In-House Services?  : No Does patient have [facility] services? : No Does patient have P4CC services?: No  ADL Screening (condition at time of admission) Patient's cognitive ability adequate to safely complete daily activities?: Yes Is the patient deaf or have difficulty hearing?: No Does the patient have difficulty seeing, even when wearing glasses/contacts?: No Does the patient have difficulty concentrating, remembering, or making decisions?: No Patient able to express need for assistance with ADLs?: Yes Does the patient have difficulty dressing or bathing?: No Independently performs ADLs?: Yes (appropriate for developmental age) Does the patient have difficulty walking or  climbing stairs?: No Weakness of Legs: None Weakness of Arms/Hands: None  Home Assistive Devices/Equipment Home Assistive Devices/Equipment: None  Therapy Consults (therapy  consults require a physician order) PT Evaluation Needed: No OT Evalulation Needed: No SLP Evaluation Needed: No Abuse/Neglect Assessment (Assessment to be complete while patient is alone) Physical Abuse: Denies Verbal Abuse: Denies Sexual Abuse: Denies Exploitation of patient/patient's resources: Denies Self-Neglect: Denies Values / Beliefs Cultural Requests During Hospitalization: None Spiritual Requests During Hospitalization: None Consults Spiritual Care Consult Needed: No Social Work Consult Needed: No      Additional Information 1:1 In Past 12 Months?: No CIRT Risk: No Elopement Risk: No Does patient have medical clearance?: Yes  Child/Adolescent Assessment Running Away Risk: Denies (Patient is an adult)  Disposition:  Disposition Initial Assessment Completed for this Encounter: Yes Disposition of Patient: Other dispositions (ER MD Ordered Psych Consult )  On Site Evaluation by:   Reviewed with Physician:    Gunnar Fusi MS, LCAS, [place], [place], CCSI Therapeutic Triage Specialist 10/19/2016 8:24 PM

## 2016-10-19 NOTE — ED Provider Notes (Signed)
Patient seen and examined by Dr. Weber Cooks, and recommends discharge with close outpatient follow-up as has been advised. Personally discuseds the case with Dr. Weber Cooks of psychiatry  Return precautions and treatment recommendations provided to the patient.   Delman Kitten, MD 10/19/16 1800

## 2020-07-15 ENCOUNTER — Emergency Department: Payer: Self-pay

## 2020-07-15 ENCOUNTER — Other Ambulatory Visit: Payer: Self-pay

## 2020-07-15 ENCOUNTER — Emergency Department
Admission: EM | Admit: 2020-07-15 | Discharge: 2020-07-15 | Disposition: A | Discharge status: Home / Self Care | Payer: Self-pay | Attending: Emergency Medicine | Admitting: Emergency Medicine

## 2020-07-15 ENCOUNTER — Encounter: Payer: Self-pay | Admitting: Emergency Medicine

## 2020-07-15 DIAGNOSIS — I2510 Atherosclerotic heart disease of native coronary artery without angina pectoris: Secondary | ICD-10-CM | POA: Insufficient documentation

## 2020-07-15 DIAGNOSIS — Z7982 Long term (current) use of aspirin: Secondary | ICD-10-CM | POA: Insufficient documentation

## 2020-07-15 DIAGNOSIS — M25572 Pain in left ankle and joints of left foot: Secondary | ICD-10-CM | POA: Insufficient documentation

## 2020-07-15 DIAGNOSIS — F17210 Nicotine dependence, cigarettes, uncomplicated: Secondary | ICD-10-CM | POA: Insufficient documentation

## 2020-07-15 DIAGNOSIS — W108XXA Fall (on) (from) other stairs and steps, initial encounter: Secondary | ICD-10-CM | POA: Insufficient documentation

## 2020-07-15 DIAGNOSIS — Y9301 Activity, walking, marching and hiking: Secondary | ICD-10-CM | POA: Insufficient documentation

## 2020-07-15 DIAGNOSIS — I10 Essential (primary) hypertension: Secondary | ICD-10-CM | POA: Insufficient documentation

## 2020-07-15 MED ORDER — MELOXICAM 15 MG PO TABS: 15.0000 mg | ORAL_TABLET | Freq: Every day | ORAL | 2 refills | Status: AC

## 2020-07-15 NOTE — ED Provider Notes (Signed)
[facility]-EMERGENCY DEPARTMENT  ____________________________________________  Time seen: Approximately 7:48 PM  I have reviewed the triage vital signs and the nursing notes.   HISTORY  Chief Complaint Fall   Historian Patient     HPI Marcus [name] is a 54 y.o. male presents to the emergency department with left ankle pain after an inversion type ankle injury.  Patient states that he lost his balance while ambulating up some steps and fell backwards.  He denies hitting his head or his neck.  Patient states that he has been able to ambulate but does have pain.  No numbness or tingling in the lower extremities.  No other alleviating measures have been attempted.   Past Medical History:  Diagnosis Date  . CAD in native artery    a. 06/28/2014: STEMI, cath LM nl, mLAD 50%, mLCx 20%, OM1 small in size 80%, ramus 100% s/p PCI/DES 0%, EF 40%   . Cocaine abuse ([place])   . Depression   . H/O gastric ulcer   . Hyperglycemia    a. A1C 5.6%  . Hypertension   . Ischemic cardiomyopathy    a. EF 40% by cath 06/28/2014  . MI (myocardial infarction) ([place])   . Polysubstance abuse ([place])    a. cocaine, tobacco, and alcohol   . Tobacco abuse      Immunizations up to date:  Yes.     Past Medical History:  Diagnosis Date  . CAD in native artery    a. 06/28/2014: STEMI, cath LM nl, mLAD 50%, mLCx 20%, OM1 small in size 80%, ramus 100% s/p PCI/DES 0%, EF 40%   . Cocaine abuse ([place])   . Depression   . H/O gastric ulcer   . Hyperglycemia    a. A1C 5.6%  . Hypertension   . Ischemic cardiomyopathy    a. EF 40% by cath 06/28/2014  . MI (myocardial infarction) ([place])   . Polysubstance abuse ([place])    a. cocaine, tobacco, and alcohol   . Tobacco abuse     Patient Active Problem List   Diagnosis Date Noted  . Substance induced mood disorder ([place]) 10/19/2016  . Cocaine abuse ([place]) 10/19/2016  . Dysthymia 10/19/2016  . Hypertension   . CAD in native artery   . Ischemic cardiomyopathy    . Polysubstance abuse ([place])   . Depression   . Hyperglycemia     Past Surgical History:  Procedure Laterality Date  . CARDIAC CATHETERIZATION  06/28/2014   x1 stent  . NISSEN FUNDOPLICATION    . stomach ulcer surgery      Prior to Admission medications   Medication Sig Start Date End Date Taking? Authorizing Provider  meloxicam (MOBIC) 15 MG tablet Take 1 tablet (15 mg total) by mouth daily. 07/15/20 07/15/21 Yes Lannie Fields, PA-C  aspirin 81 MG tablet Take 81 mg by mouth daily.    [provider]  prasugrel (EFFIENT) 10 MG TABS tablet Take 1 tablet (10 mg total) by mouth daily. Patient not taking: Reported on 10/19/2016 07/10/14   Rise Mu, PA-C    Allergies Codeine and Percocet [oxycodone-acetaminophen]  Family History  Problem Relation Age of Onset  . Heart failure Mother     Social History Social History   Tobacco Use  . Smoking status: Current Every Day Smoker    Packs/day: 5.00    Years: 38.00    Pack years: 190.00    Types: Cigarettes  . Smokeless tobacco: Never Used  Substance Use Topics  .  Alcohol use: Yes    Alcohol/week: 0.0 standard drinks  . Drug use: No    Types: Cocaine, Marijuana     Review of Systems  Constitutional: No fever/chills Eyes:  No discharge ENT: No upper respiratory complaints. Respiratory: no cough. No SOB/ use of accessory muscles to breath Gastrointestinal:   No nausea, no vomiting.  No diarrhea.  No constipation. Musculoskeletal: Patient has left ankle pain.  Skin: Negative for rash, abrasions, lacerations, ecchymosis.    ____________________________________________   PHYSICAL EXAM:  VITAL SIGNS: ED Triage Vitals  Enc Vitals Group     BP 07/15/20 1549 (!) 162/96     Pulse Rate 07/15/20 1549 88     Resp 07/15/20 1549 20     Temp 07/15/20 1549 98.7 F (37.1 C)     Temp Source 07/15/20 1549 Oral     SpO2 07/15/20 1549 98 %     Weight 07/15/20 1549 175 lb (79.4 kg)     Height 07/15/20 1549 5\' 3"   (1.6 m)     Head Circumference --      Peak Flow --      Pain Score 07/15/20 1554 9     Pain Loc --      Pain Edu? --      Excl. in GC? --      Constitutional: Alert and oriented. Well appearing and in no acute distress. Eyes: Conjunctivae are normal. PERRL. EOMI. Head: Atraumatic. Cardiovascular: Normal rate, regular rhythm. Normal S1 and S2.  Good peripheral circulation. Respiratory: Normal respiratory effort without tachypnea or retractions. Lungs CTAB. Good air entry to the bases with no decreased or absent breath sounds Gastrointestinal: Bowel sounds x 4 quadrants. Soft and nontender to palpation. No guarding or rigidity. No distention. Musculoskeletal: Patient performs full range of motion at the left ankle.  No large ankle effusion.  Palpable dorsalis pedis pulse, left.  Capillary refill less than 2 seconds on the left. Neurologic:  Normal for age. No gross focal neurologic deficits are appreciated.  Skin:  Skin is warm, dry and intact. No rash noted. Psychiatric: Mood and affect are normal for age. Speech and behavior are normal.   ____________________________________________   LABS (all labs ordered are listed, but only abnormal results are displayed)  Labs Reviewed - No data to display ____________________________________________  EKG   ____________________________________________  RADIOLOGY 07/17/20, personally viewed and evaluated these images (plain radiographs) as part of my medical decision making, as well as reviewing the written report by the radiologist.  DG Ankle Complete Left  Result Date: 07/15/2020 CLINICAL DATA:  fall, pain, swelling EXAM: LEFT ANKLE COMPLETE - 3+ VIEW COMPARISON:  None. FINDINGS: Normal alignment with approximation of the joints. Smooth talar dome. No fracture or focal osseous lesion. Diffuse soft tissue swelling. Small joint effusion. IMPRESSION: No acute osseous abnormality. Soft tissue swelling and small joint effusion.  Electronically Signed   By: 07/17/2020 M.D.   On: 07/15/2020 16:36    ____________________________________________    PROCEDURES  Procedure(s) performed:     Procedures     Medications - No data to display   ____________________________________________   INITIAL IMPRESSION / ASSESSMENT AND PLAN / ED COURSE  Pertinent labs & imaging results that were available during my care of the patient were reviewed by me and considered in my medical decision making (see chart for details).     Assessment and plan Ankle pain 54 year old male presents to the emergency department with acute left ankle pain after  a mechanical fall 3 days ago.  Patient was hypertensive at triage but vital signs were otherwise reassuring.  X-ray of the left ankle revealed no bony abnormality.  An Ace wrap was applied and patient was prescribed meloxicam for pain and inflammation.  Return precautions were given to return with new or worsening symptoms.  All patient questions were answered.      ____________________________________________  FINAL CLINICAL IMPRESSION(S) / ED DIAGNOSES  Final diagnoses:  Acute left ankle pain      NEW MEDICATIONS STARTED DURING THIS VISIT:  ED Discharge Orders         Ordered    meloxicam (MOBIC) 15 MG tablet  Daily        07/15/20 1807              This chart was dictated using voice recognition software/Dragon. Despite best efforts to proofread, errors can occur which can change the meaning. Any change was purely unintentional.     Lannie Fields, PA-C 07/15/20 1950    Vladimir Crofts, MD 07/16/20 Dyann Kief

## 2020-07-15 NOTE — ED Notes (Signed)
No e-signature pad available to obtain e-signature for patient discharge.  Pt verbalized understanding of all discharge instructions, f/u care, and medications.

## 2020-07-15 NOTE — ED Triage Notes (Signed)
Fell last Thursday morning while walking up steps, fell backward, injuring left ankle.

## 2020-07-15 NOTE — Discharge Instructions (Addendum)
Take Meloxicam daily for pain and inflammation.

## 2022-02-09 ENCOUNTER — Ambulatory Visit: Payer: Medicaid Other | Admitting: Cardiovascular Disease

## 2022-02-09 ENCOUNTER — Encounter: Payer: Self-pay | Admitting: Cardiovascular Disease

## 2022-02-09 VITALS — BP 130/74 | HR 105 | Ht 63.0 in | Wt 188.4 lb

## 2022-02-09 DIAGNOSIS — I209 Angina pectoris, unspecified: Secondary | ICD-10-CM

## 2022-02-09 DIAGNOSIS — I25118 Atherosclerotic heart disease of native coronary artery with other forms of angina pectoris: Secondary | ICD-10-CM

## 2022-02-09 DIAGNOSIS — F17200 Nicotine dependence, unspecified, uncomplicated: Secondary | ICD-10-CM

## 2022-02-09 DIAGNOSIS — E782 Mixed hyperlipidemia: Secondary | ICD-10-CM

## 2022-02-09 DIAGNOSIS — I10 Essential (primary) hypertension: Secondary | ICD-10-CM

## 2022-02-09 DIAGNOSIS — F1910 Other psychoactive substance abuse, uncomplicated: Secondary | ICD-10-CM

## 2022-02-09 MED ORDER — METOPROLOL SUCCINATE ER 25 MG PO TB24: 25.0000 mg | ORAL_TABLET | Freq: Every day | ORAL | 3 refills | Status: DC

## 2022-02-09 MED ORDER — METOPROLOL SUCCINATE ER 50 MG PO TB24: 50.0000 mg | ORAL_TABLET | Freq: Every day | ORAL | 3 refills | Status: DC

## 2022-02-09 NOTE — Patient Instructions (Addendum)
Medication Instructions:  Your physician has recommended you make the following change in your medication:    START metoprolol succinate 50 mg daily  STOP the hydrochlorothiazide 12.5 daily  If you need a refill on your cardiac medications before your next appointment, please call your pharmacy.   Lab work: No new labs needed  Testing/Procedures: [employer]  Your caregiver has ordered a Stress Test with nuclear imaging. The purpose of this test is to evaluate the blood supply to your heart muscle. This procedure is referred to as a "Non-Invasive Stress Test." This is because other than having an IV started in your vein, nothing is inserted or "invades" your body. Cardiac stress tests are done to find areas of poor blood flow to the heart by determining the extent of coronary artery disease (CAD). Some patients exercise on a treadmill, which naturally increases the blood flow to your heart, while others who are  unable to walk on a treadmill due to physical limitations have a pharmacologic/chemical stress agent called Lexiscan . This medicine will mimic walking on a treadmill by temporarily increasing your coronary blood flow.   Please note: these test may take anywhere between 2-4 hours to complete  PLEASE REPORT TO [facility] AT THE FIRST DESK WILL DIRECT YOU WHERE TO GO  Date of Procedure:_____________________________________  Arrival Time for Procedure:______________________________  Instructions regarding medication: You may take all of your regular medications  PLEASE NOTIFY THE OFFICE AT LEAST 24 HOURS IN ADVANCE IF YOU ARE UNABLE TO KEEP YOUR APPOINTMENT.  [phone] AND  PLEASE NOTIFY NUCLEAR MEDICINE AT [facility] AT LEAST 24 HOURS IN ADVANCE IF YOU ARE UNABLE TO KEEP YOUR APPOINTMENT. [phone]  How to prepare for your Myoview test:  Do not eat or drink after midnight No caffeine for 24 hours prior to test No smoking 24 hours prior to  test. Your medication may be taken with water.  If your doctor stopped a medication because of this test, do not take that medication. Ladies, please do not wear dresses.  Skirts or pants are appropriate. Please wear a short sleeve shirt. No perfume, cologne or lotion. Wear comfortable walking shoes. No heels!   Follow-Up: At [facility], you and your health needs are our priority.  As part of our continuing mission to provide you with exceptional heart care, we have created designated Provider Care Teams.  These Care Teams include your primary Cardiologist (physician) and Advanced Practice Providers (APPs -  Physician Assistants and Nurse Practitioners) who all work together to provide you with the care you need, when you need it.  You will need a follow up appointment in 6 months, APP ok  Providers on your designated Care Team:   Murray Hodgkins, NP Christell Faith, PA-C Cadence Kathlen Mody, [place]  COVID-19 Vaccine Information can be found at: [email or web address] For questions related to vaccine distribution or appointments, please email [email or web address] or call [phone].

## 2022-02-09 NOTE — Progress Notes (Signed)
Cardiology Office Note  Date:  02/09/2022   ID:  Marcus [name], DOB 1966-01-25, MRN [id number]  PCP:  Dianna Rossetti, NP   Chief Complaint  Patient presents with   New Patient (Initial Visit)    Establish care for CAD/MI 2015 s/p stent placement. Patient c/o shortness of breath, chest pain that radiated down his left arm with numbness from his left shoulder to finger tips; had several spells over the past couple of months. Medications reviewed by the patient verbally.     HPI:  Marcus [name] is a 56 year old gentleman with past medical history of Polysubstance abuse in 2015, ETOH, tobacco up to 1 pack of cigarettes daily, and cocaine (postive 4/18).  Coronary artery disease, ST elevation MI December 2015 of the ramus intermedius status post PCI drug-eluting stent placement well,  Who presents by referral from Bernie Covey  for new patient consultation for his history of coronary disease  Seen in the emergency room April 2018 for chest pain/depression Treated for depression/anxiety, suicidal ideation  On last clinic visit December 2015 was not taking his medications including aspirin/Effient  In follow-up today reports he is not on Effient Recently seen by primary care, reports that he is taking his blood pressure medication and cholesterol medication  Last week with chest pain, left arm went numb, lasted 30 min Has had stuttering left arm numbness since that time Does not work, sits around the house, no significant activities  Continues to smoke 1 pack/day  EKG personally reviewed by myself on todays visit Sinus tachycardia rate 105 bpm right bundle branch block unable to exclude old lateral infarct  Other past medical history reviewed admitted [facility] on 12[facility]10 after developing continuous chest pain that radiated to his back the night before with a ST elevation MI. EKG revealed borderline 1 mm of lateral ST elevation. Troponin was found to be 39.00. CT of the chest showed  no aortic dissection. He was taken urgently to the cardiac cath lab that showed an occluded Ramus intermedius s/p PCI/DES Residual 50% LAD, 80% OM disease  PMH:   has a past medical history of CAD in native artery, Cocaine abuse ([place]), Depression, H/O gastric ulcer, Hyperglycemia, Hypertension, Ischemic cardiomyopathy, MI (myocardial infarction) ([place]), Polysubstance abuse ([place]), and Tobacco abuse.  PSH:    Past Surgical History:  Procedure Laterality Date   CARDIAC CATHETERIZATION  06/28/2014   x1 stent   NISSEN FUNDOPLICATION     stomach ulcer surgery      Current Outpatient Medications  Medication Sig Dispense Refill   aspirin 81 MG tablet Take 81 mg by mouth daily.     DULoxetine (CYMBALTA) 30 MG capsule Take 30 mg by mouth daily.     hydrOXYzine (VISTARIL) 25 MG capsule Take 25 mg by mouth 2 (two) times daily.     metoprolol succinate (TOPROL-XL) 25 MG 24 hr tablet Take 1 tablet (25 mg total) by mouth daily. Take with or immediately following a meal. 90 tablet 3   nitroGLYCERIN (NITROSTAT) 0.4 MG SL tablet SMARTSIG:1 Sublingual Daily     olmesartan-hydrochlorothiazide (BENICAR HCT) 40-12.5 MG tablet Take 1 tablet by mouth daily.     pantoprazole (PROTONIX) 40 MG tablet Take 40 mg by mouth daily.     QUEtiapine (SEROQUEL XR) 50 MG TB24 24 hr tablet Take by mouth.     rosuvastatin (CRESTOR) 40 MG tablet Take 40 mg by mouth daily.     No current facility-administered medications for this visit.  Allergies:   Percocet [oxycodone-acetaminophen] and Codeine   Social History:  The patient  reports that he has been smoking cigarettes. He has a 38.00 pack-year smoking history. He has never used smokeless tobacco. He reports current alcohol use. He reports that he does not use drugs.   Family History:   family history includes Heart failure in his mother.   Review of Systems: Review of Systems  Constitutional: Negative.   HENT: Negative.    Respiratory: Negative.     Cardiovascular: Negative.   Gastrointestinal: Negative.   Musculoskeletal: Negative.   Neurological: Negative.   Psychiatric/Behavioral: Negative.    All other systems reviewed and are negative.   PHYSICAL EXAM: VS:  BP 130/74 (BP Location: Right Arm, Patient Position: Sitting, Cuff Size: Normal)   Pulse (!) 105   Ht '5\' 3"'$  (1.6 m)   Wt 188 lb 6 oz (85.4 kg)   SpO2 98%   BMI 33.37 kg/m  , BMI Body mass index is 33.37 kg/m. Constitutional:  oriented to person, place, and time. No distress.  HENT:  Head: Grossly normal Eyes:  no discharge. No scleral icterus.  Neck: No JVD, no carotid bruits  Cardiovascular: Regular rate and rhythm, no murmurs appreciated Pulmonary/Chest: Clear to auscultation bilaterally, no wheezes or rails Abdominal: Soft.  no distension.  no tenderness.  Musculoskeletal: Normal range of motion Neurological:  normal muscle tone. Coordination normal. No atrophy Skin: Skin warm and dry Psychiatric: normal affect, pleasant  Recent Labs: No results found for requested labs within last 365 days.    Lipid Panel Lab Results  Component Value Date   CHOL 140 06/29/2014   HDL 45 06/29/2014   LDLCALC 60 06/29/2014   TRIG 176 06/29/2014    Wt Readings from Last 3 Encounters:  02/09/22 188 lb 6 oz (85.4 kg)  07/15/20 175 lb (79.4 kg)  10/19/16 133 lb (60.3 kg)     ASSESSMENT AND PLAN:  Problem List Items Addressed This Visit     Polysubstance abuse ([place])   Other Visit Diagnoses     Angina pectoris ([place])    -  Primary   Relevant Medications   nitroGLYCERIN (NITROSTAT) 0.4 MG SL tablet   olmesartan-hydrochlorothiazide (BENICAR HCT) 40-12.5 MG tablet   rosuvastatin (CRESTOR) 40 MG tablet   metoprolol succinate (TOPROL-XL) 25 MG 24 hr tablet   Other Relevant Orders   NM Myocar Multi W/Spect W/Wall Motion / EF   Coronary artery disease of native artery of native heart with stable angina pectoris (HCC)       Relevant Medications   DULoxetine  (CYMBALTA) 30 MG capsule   nitroGLYCERIN (NITROSTAT) 0.4 MG SL tablet   olmesartan-hydrochlorothiazide (BENICAR HCT) 40-12.5 MG tablet   rosuvastatin (CRESTOR) 40 MG tablet   metoprolol succinate (TOPROL-XL) 25 MG 24 hr tablet   Smoker       Essential hypertension       Relevant Medications   nitroGLYCERIN (NITROSTAT) 0.4 MG SL tablet   olmesartan-hydrochlorothiazide (BENICAR HCT) 40-12.5 MG tablet   rosuvastatin (CRESTOR) 40 MG tablet   metoprolol succinate (TOPROL-XL) 25 MG 24 hr tablet   Mixed hyperlipidemia       Relevant Medications   nitroGLYCERIN (NITROSTAT) 0.4 MG SL tablet   olmesartan-hydrochlorothiazide (BENICAR HCT) 40-12.5 MG tablet   rosuvastatin (CRESTOR) 40 MG tablet   metoprolol succinate (TOPROL-XL) 25 MG 24 hr tablet      Coronary disease with stable angina Reports having recent episodes of left-sided chest pain, left arm pain with symptoms  of left arm numbness Some typical and atypical features High risk of recurrent and worsening coronary disease given continued smoking 1 pack/day, hyperlipidemia We have ordered Lexiscan Myoview to rule out ischemia Recommend he start metoprolol succinate 50 mg daily  Essential hypertension Recommend he start metoprolol succinate 50 mg daily Suggest he stop the HCTZ 12.5 daily and stay on the olmesartan HCTZ 40/12.5 He has not taken his medications this morning, systolic pressure 370-4 40 systolic  Hyperlipidemia Stressed importance of compliance with his Crestor 40 daily  Smoker We have encouraged him to continue to work on weaning his cigarettes and smoking cessation. He will continue to work on this and does not want any assistance with chantix.     Total encounter time more than 60 minutes  Greater than 50% was spent in counseling and coordination of care with the patient  Patient was seen in consultation for Bernie Covey will be referred back to our office for ongoing care of the issues detailed  above  Signed, Esmond Plants, M.D., Ph.D. [place], [place]

## 2022-02-10 ENCOUNTER — Other Ambulatory Visit: Payer: Self-pay

## 2022-02-10 ENCOUNTER — Telehealth: Payer: Self-pay | Admitting: Cardiovascular Disease

## 2022-02-10 MED ORDER — METOPROLOL SUCCINATE ER 50 MG PO TB24: 50.0000 mg | ORAL_TABLET | Freq: Every day | ORAL | 3 refills | Status: DC

## 2022-02-10 NOTE — Telephone Encounter (Signed)
*  STAT* If patient is at the pharmacy, call can be transferred to refill team.   1. Which medications need to be refilled? (please list name of each medication and dose if known) metoprolol succinate (TOPROL-XL) 50 MG 24 hr tablet  2. Which pharmacy/location (including street and city if local pharmacy) is medication to be sent to? [place], [place]  3. Do they need a 30 day or 90 day supply? 90  This was sent to the wrong pharmacy on 07/24. Requesting it be sent to this pharmacy.

## 2022-02-12 NOTE — Addendum Note (Signed)
Addended by: Nestor Ramp on: 02/12/2022 03:36 PM   Modules accepted: Orders

## 2022-02-13 ENCOUNTER — Encounter
Admission: RE | Admit: 2022-02-13 | Discharge: 2022-02-13 | Disposition: A | Discharge status: Home / Self Care | Payer: Medicaid Other | Source: Ambulatory Visit | Attending: Cardiovascular Disease | Admitting: Cardiovascular Disease

## 2022-02-13 DIAGNOSIS — I209 Angina pectoris, unspecified: Secondary | ICD-10-CM

## 2022-02-13 MED ORDER — TECHNETIUM TC 99M TETROFOSMIN IV KIT
32.7000 | PACK | Freq: Once | INTRAVENOUS | Status: AC | PRN
  Administered 2022-02-13: 32.7 via INTRAVENOUS

## 2022-02-13 MED ORDER — TECHNETIUM TC 99M TETROFOSMIN IV KIT
10.2200 | PACK | Freq: Once | INTRAVENOUS | Status: AC | PRN
  Administered 2022-02-13: 10.22 via INTRAVENOUS

## 2022-02-13 MED ORDER — REGADENOSON 0.4 MG/5ML IV SOLN
0.4000 mg | Freq: Once | INTRAVENOUS | Status: AC
  Administered 2022-02-13: 0.4 mg via INTRAVENOUS

## 2022-02-14 LAB — NM MYOCAR MULTI W/SPECT W/WALL MOTION / EF
LV dias vol: 112 mL (ref 62–150)
LV sys vol: 52 mL
Nuc Stress EF: 54 %
Peak HR: 93 {beats}/min
Percent HR: 56 %
Rest HR: 63 {beats}/min
Rest Nuclear Isotope Dose: 10.2 mCi
SDS: 2
SRS: 18
SSS: 17
ST Depression (mm): 0 mm
Stress Nuclear Isotope Dose: 32.7 mCi
TID: 1.16

## 2022-02-17 ENCOUNTER — Telehealth: Payer: Self-pay | Admitting: Emergency Medicine

## 2022-02-17 NOTE — Telephone Encounter (Signed)
Called and spoke with patient. Results reviewed with patient, pt verbalized understanding,  questions (if any) answered.   ?

## 2022-02-17 NOTE — Telephone Encounter (Signed)
-----   Message from Minna Merritts, MD sent at 02/14/2022  4:23 PM EDT ----- Stress test Prior MI documented on stress testing Though no new ischemia concerning for need to do heart catheterization Low normal ejection fraction recorded Continue medical management

## 2022-03-01 IMAGING — CR DG ANKLE COMPLETE 3+V*L*
1 series · 3 of 3 positions shown · non-contrast
Comparison: None.

CLINICAL DATA: fall, pain, swelling

EXAM:
LEFT ANKLE COMPLETE - 3+ VIEW

[Series 1: dg ankle complete left · 0.14mm/px · 3 of 3 slices shown]
[im 1/3]
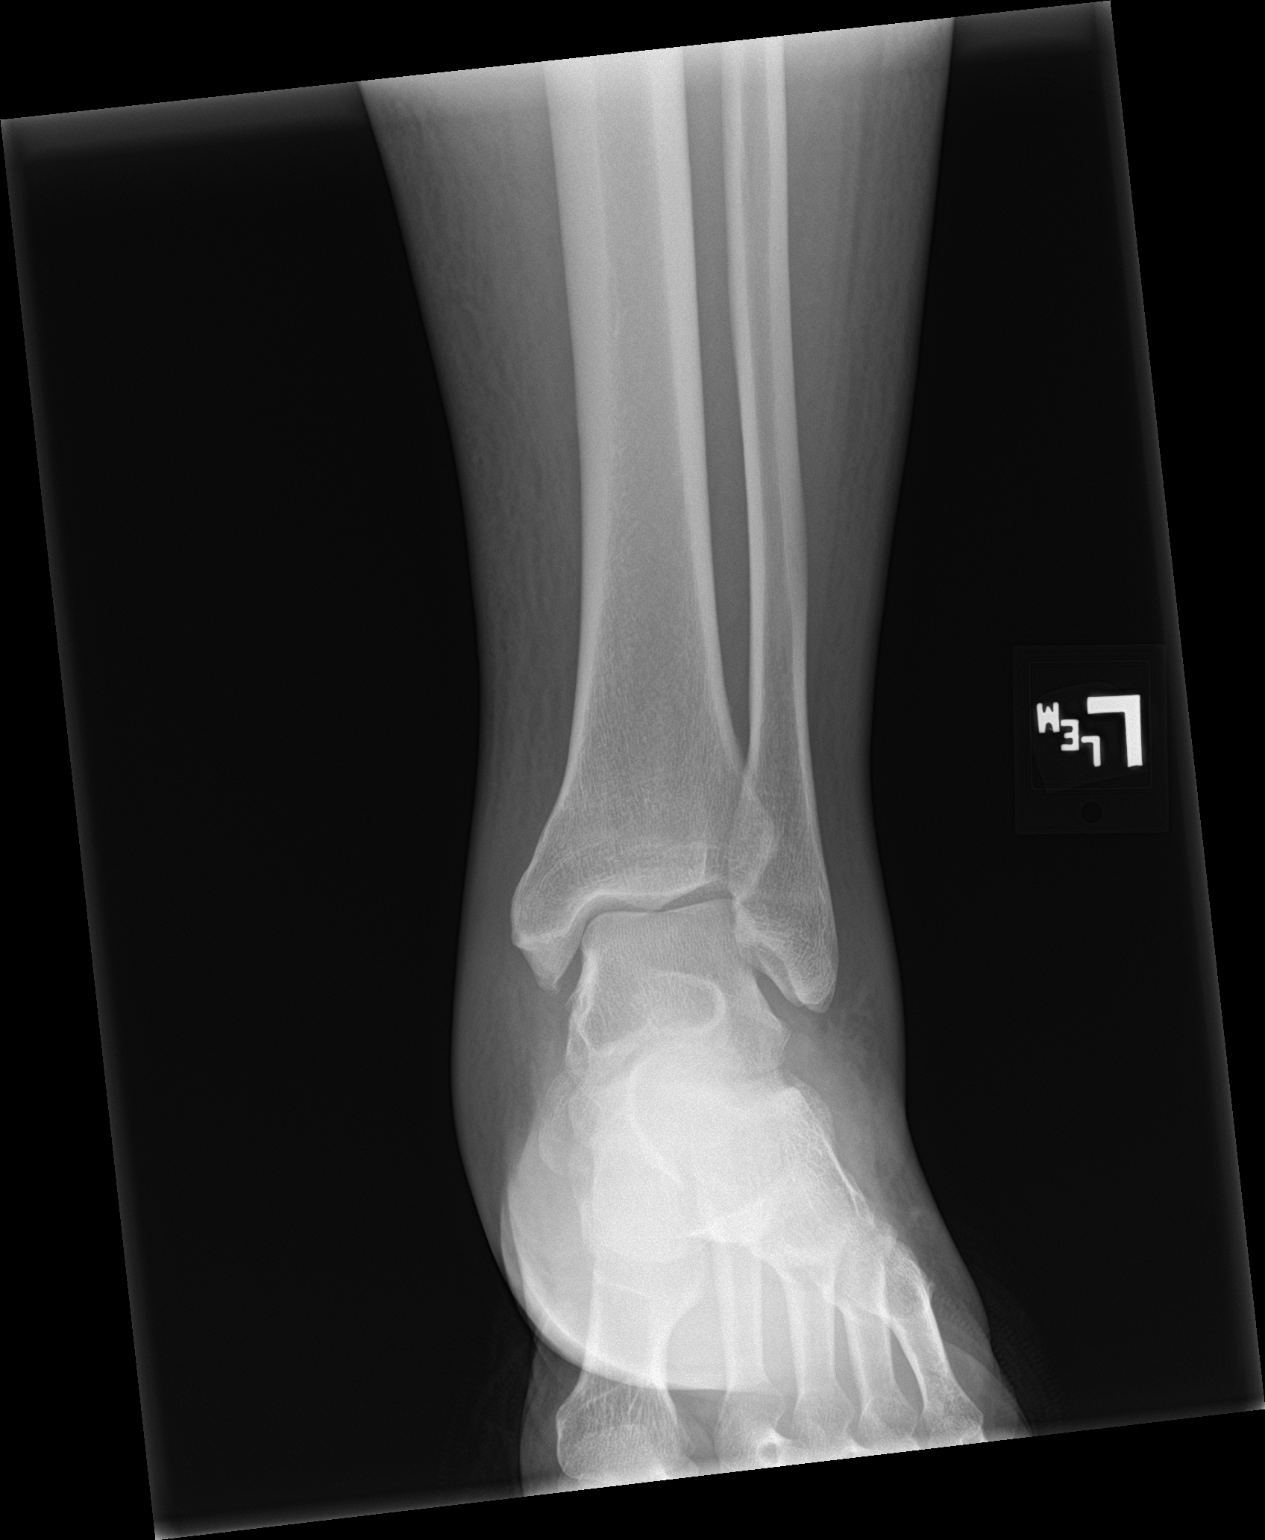
[im 2/3]
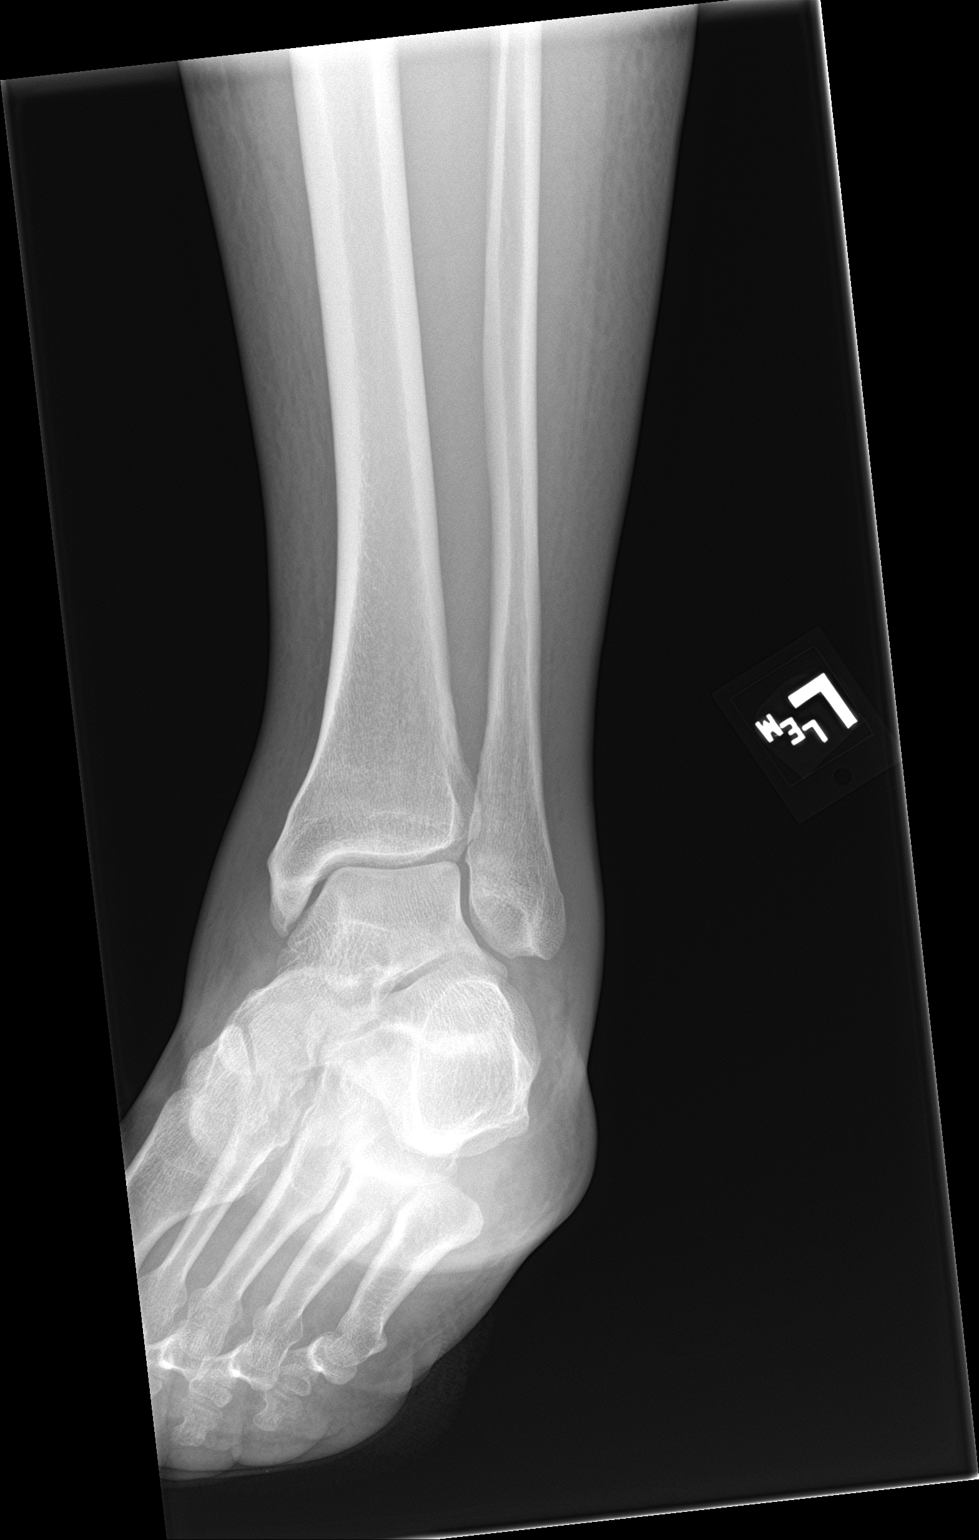
[im 3/3]
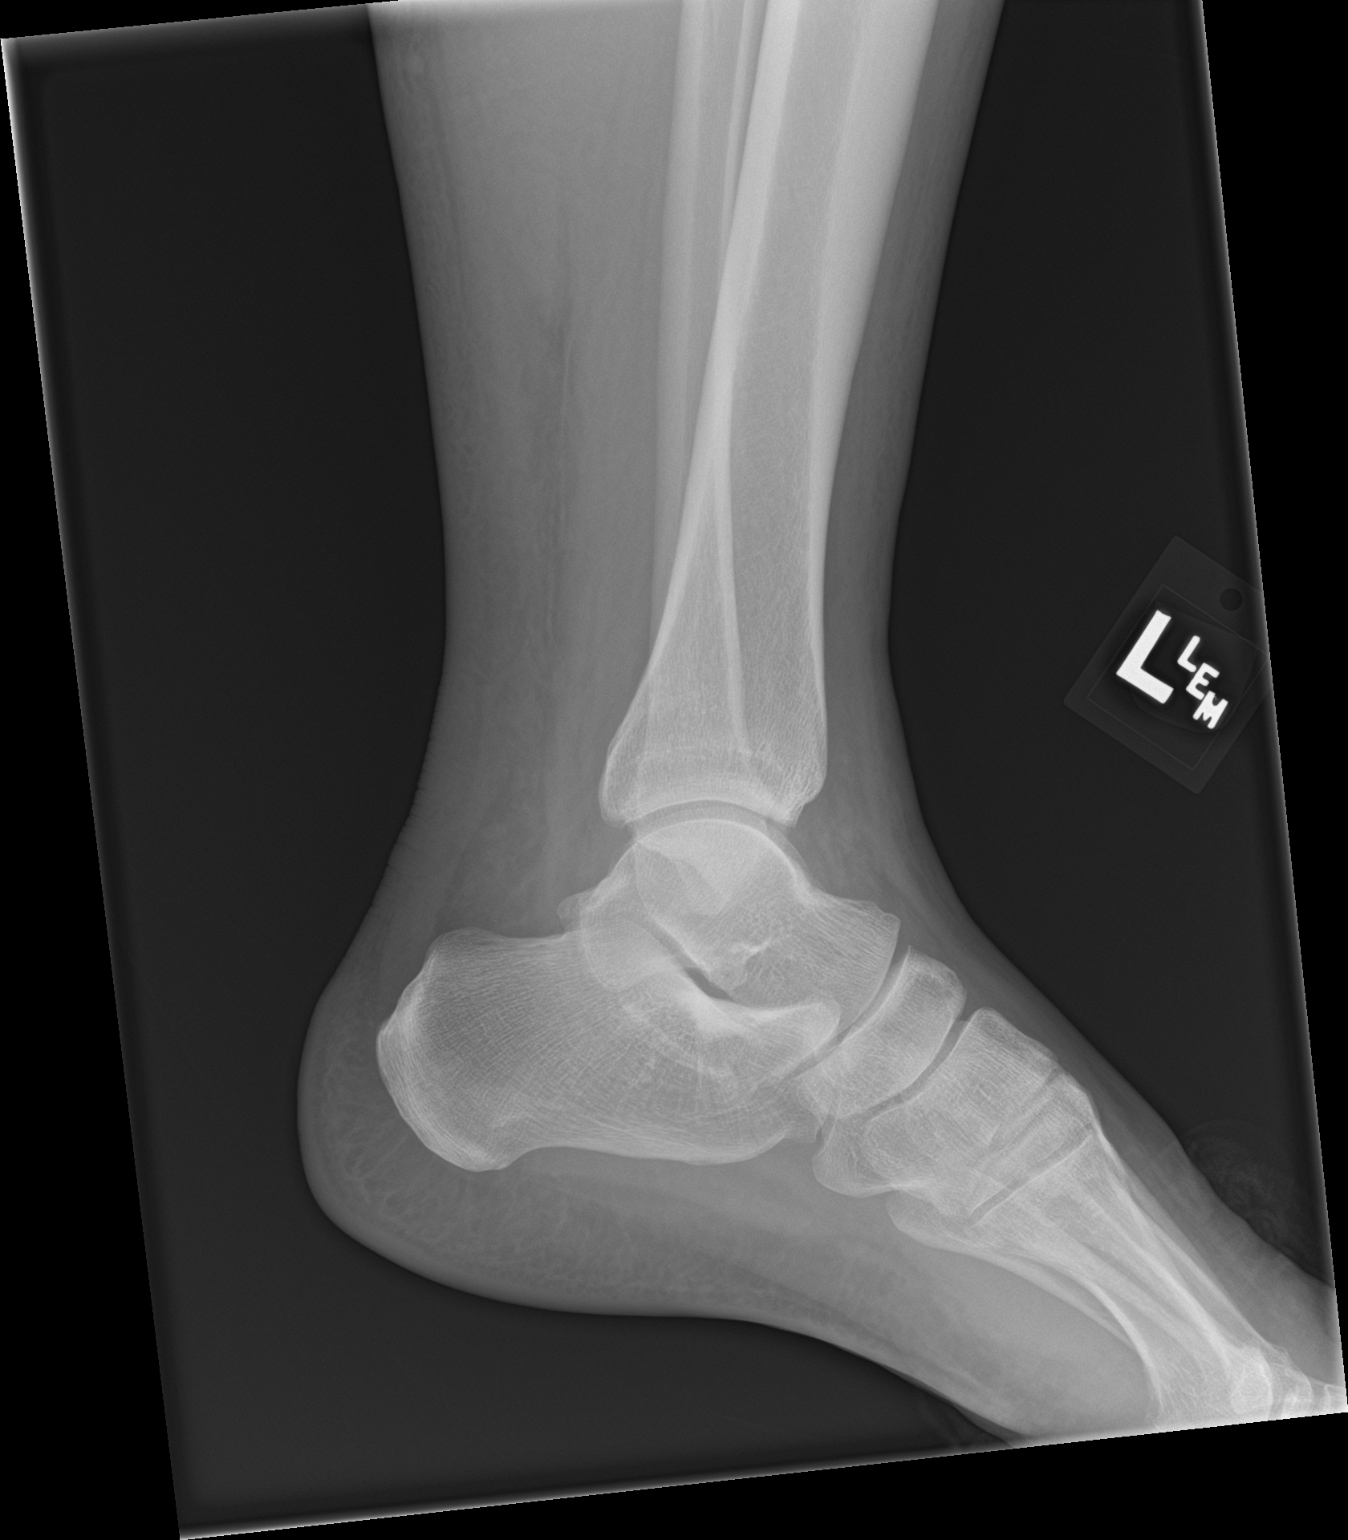

[3 of 3 positions shown; findings below may reference images not displayed]

FINDINGS: Normal alignment with approximation of the joints. Smooth talar
dome. No fracture or focal osseous lesion. Diffuse soft tissue
swelling. Small joint effusion.
IMPRESSION: No acute osseous abnormality.

Soft tissue swelling and small joint effusion.

## 2022-04-17 ENCOUNTER — Telehealth: Payer: Self-pay | Admitting: Internal Medicine

## 2022-04-17 NOTE — Telephone Encounter (Signed)
Lvm to schedule New appointment-Marcus [name]

## 2022-04-23 ENCOUNTER — Telehealth: Payer: Self-pay | Admitting: Internal Medicine

## 2022-04-23 ENCOUNTER — Ambulatory Visit: Payer: Self-pay | Admitting: Internal Medicine

## 2022-04-23 ENCOUNTER — Encounter: Payer: Self-pay | Admitting: Internal Medicine

## 2022-04-23 VITALS — BP 140/80 | HR 84 | Temp 98.3°F | Resp 16 | Ht 63.0 in | Wt 187.0 lb

## 2022-04-23 DIAGNOSIS — J4489 Other specified chronic obstructive pulmonary disease: Secondary | ICD-10-CM

## 2022-04-23 DIAGNOSIS — R0602 Shortness of breath: Secondary | ICD-10-CM

## 2022-04-23 DIAGNOSIS — G4719 Other hypersomnia: Secondary | ICD-10-CM | POA: Diagnosis not present

## 2022-04-23 DIAGNOSIS — I2583 Coronary atherosclerosis due to lipid rich plaque: Secondary | ICD-10-CM

## 2022-04-23 DIAGNOSIS — G4759 Other parasomnia: Secondary | ICD-10-CM

## 2022-04-23 DIAGNOSIS — I2510 Atherosclerotic heart disease of native coronary artery without angina pectoris: Secondary | ICD-10-CM

## 2022-04-23 NOTE — Telephone Encounter (Signed)
Awaiting 04/23/22 office notes for SS order-Toni

## 2022-04-23 NOTE — Patient Instructions (Signed)

## 2022-04-23 NOTE — Progress Notes (Signed)
[facility] [place], [place] [address]  Pulmonary Sleep Medicine   Office Visit Note  Patient Name: Marcus [name] DOB: 05-17-1966 MRN [id number]  Date of Service: 04/23/2022  Complaints/HPI: Patient has had some cardiac and breathing issues. He was seen in July for chest pain. Patient states he was having chest pain. States he was not doing cocaine at the time. Patient states he has had some SOB along with this. He is still smoking. He smokes 1ppd for the last 42 years. Patient has coughing some sputum aong with the symptoms. He states no wheeze. Notes weight gain. He currently is on inhalers seems to help a little. Advair and ventolin are his current meds. He feels the advair does not really make any difference. He also does have a history of CAD a myoview shows past MI but no active ischemia. He does note a restless sleep and has got fatigue during the daytime. He notes sleep walking. He snores. Also has been noted to wake in a closet at times. His sleep is also fragmented and wakes every two hours with gasping and choking  ROS  General: (-) fever, (-) chills, (-) night sweats, (-) weakness Skin: (-) rashes, (-) itching,. Eyes: (-) visual changes, (-) redness, (-) itching. Nose and Sinuses: (-) nasal stuffiness or itchiness, (-) postnasal drip, (-) nosebleeds, (-) sinus trouble. Mouth and Throat: (-) sore throat, (-) hoarseness. Neck: (-) swollen glands, (-) enlarged thyroid, (-) neck pain. Respiratory: + cough, (-) bloody sputum, + shortness of breath, - wheezing. Cardiovascular: - ankle swelling, (-) chest pain. Lymphatic: (-) lymph node enlargement. Neurologic: (-) numbness, (-) tingling. Psychiatric: (-) anxiety, (-) depression   Current Medication: Outpatient Encounter Medications as of 04/23/2022  Medication Sig   aspirin 81 MG tablet Take 81 mg by mouth daily.   DULoxetine (CYMBALTA) 30 MG capsule Take 30 mg by mouth daily.   hydrOXYzine  (VISTARIL) 25 MG capsule Take 25 mg by mouth 2 (two) times daily.   metoprolol succinate (TOPROL-XL) 50 MG 24 hr tablet Take 1 tablet (50 mg total) by mouth daily. Take with or immediately following a meal.   nitroGLYCERIN (NITROSTAT) 0.4 MG SL tablet SMARTSIG:1 Sublingual Daily   olmesartan-hydrochlorothiazide (BENICAR HCT) 40-12.5 MG tablet Take 1 tablet by mouth daily.   pantoprazole (PROTONIX) 40 MG tablet Take 40 mg by mouth daily.   QUEtiapine (SEROQUEL XR) 50 MG TB24 24 hr tablet Take 50 mg by mouth daily. Take 2 tablets by mouth daily at bedtime   rosuvastatin (CRESTOR) 40 MG tablet Take 40 mg by mouth daily.   ADVAIR DISKUS 250-50 MCG/ACT AEPB 2 (two) times daily.   VENTOLIN HFA 108 (90 Base) MCG/ACT inhaler SMARTSIG:2 Puff(s) By Mouth Every 4 Hours PRN   No facility-administered encounter medications on file as of 04/23/2022.    Surgical History: Past Surgical History:  Procedure Laterality Date   CARDIAC CATHETERIZATION  06/28/2014   x1 stent   NISSEN FUNDOPLICATION     stomach ulcer surgery      Medical History: Past Medical History:  Diagnosis Date   CAD in native artery    a. 06/28/2014: STEMI, cath LM nl, mLAD 50%, mLCx 20%, OM1 small in size 80%, ramus 100% s/p PCI/DES 0%, EF 40%    Cocaine abuse (HCC)    Depression    H/O gastric ulcer    Hyperglycemia    a. A1C 5.6%   Hypertension    Ischemic cardiomyopathy    a. EF  40% by cath 06/28/2014   MI (myocardial infarction) ([place])    Polysubstance abuse ([place])    a. cocaine, tobacco, and alcohol    Tobacco abuse     Family History: Family History  Problem Relation Age of Onset   Heart failure Mother    Stroke Father    Heart failure Father    Prostate cancer Brother    Heart attack Brother     Social History: Social History   Socioeconomic History   Marital status: Single    Spouse name: Not on file   Number of children: Not on file   Years of education: Not on file   Highest education level: Not on  file  Occupational History   Not on file  Tobacco Use   Smoking status: Every Day    Packs/day: 1.00    Years: 38.00    Total pack years: 38.00    Types: Cigarettes   Smokeless tobacco: Never  Vaping Use   Vaping Use: Never used  Substance and Sexual Activity   Alcohol use: Yes    Comment: every weekend 6-12 beer   Drug use: No    Types: Cocaine, Marijuana   Sexual activity: Not on file  Other Topics Concern   Not on file  Social History Narrative   Not on file   Social Determinants of Health   Financial Resource Strain: Not on file  Food Insecurity: Not on file  Transportation Needs: Not on file  Physical Activity: Not on file  Stress: Not on file  Social Connections: Not on file  Intimate Partner Violence: Not on file    Vital Signs: Blood pressure (!) 140/80, pulse 84, temperature 98.3 F (36.8 C), resp. rate 16, height '5\' 3"'$  (1.6 m), weight 187 lb (84.8 kg), SpO2 98 %.  Examination: General Appearance: The patient is well-developed, well-nourished, and in no distress. Skin: Gross inspection of skin unremarkable. Head: normocephalic, no gross deformities. Eyes: no gross deformities noted. ENT: ears appear grossly normal no exudates.malampati 4 Neck: Supple. No thyromegaly. No LAD. Respiratory: few rhonchi noted. Cardiovascular: Normal S1 and S2 without murmur or rub. Extremities: No cyanosis. pulses are equal. Neurologic: Alert and oriented. No involuntary movements.  LABS: Recent Results (from the past 2160 hour(s))  NM Myocar Multi W/Spect W/Wall Motion / EF     Status: None   Collection Time: 02/13/22 11:10 AM  Result Value Ref Range   Rest BP 146/77 mmHg   Rest HR 63.0 bpm   Peak HR 93 bpm   Peak BP 146/77 mmHg   Percent HR 56.0 %   ST Depression (mm) 0 mm   Rest Nuclear Isotope Dose 10.2 mCi   Stress Nuclear Isotope Dose 32.7 mCi   SSS 17.0    SRS 18.0    SDS 2.0    TID 1.16    LV sys vol 52.0 mL   LV dias vol 112.0 62 - 150 mL   Nuc Stress  EF 54 %    Radiology: NM Myocar Multi W/Spect W/Wall Motion / EF  Result Date: 02/14/2022 Pharmacological myocardial perfusion imaging study with no significant  ischemia Large region fixed defect lateral wall consistent with prior MI Normal wall motion, EF estimated at 52% No EKG changes concerning for ischemia at peak stress or in recovery. Moderate risk scan given size of prior MI Signed, Esmond Plants, MD, Ph.D [facility]    No results found.  No results found.    Assessment and Plan: Patient  Active Problem List   Diagnosis Date Noted   Substance induced mood disorder ([place]) 10/19/2016   Cocaine abuse ([place]) 10/19/2016   Dysthymia 10/19/2016   Hypertension    CAD in native artery    Ischemic cardiomyopathy    Polysubstance abuse ([place])    Depression    Hyperglycemia     1. Shortness of breath We will get pulmonary function scheduled for this patient because of increasing shortness of breath symptoms. - Pulmonary function test; Future  2. Obstructive chronic bronchitis without exacerbation He has been on inhalers plan is going to be to continue with inhalers.  3. Obesity, morbid ([place]) Obesity Counseling: Had a lengthy discussion regarding patients BMI and weight issues. Patient was instructed on portion control as well as increased activity. Also discussed caloric restrictions with trying to maintain intake less than 2000 Kcal. Discussions were made in accordance with the 5As of weight management. Simple actions such as not eating late and if able to, taking a walk is suggested.   4. Other hypersomnia Interesting case patient appears to be exhibiting parasomnias also so will need a video polysomnogram to assess if this is the case.  Avoidance of alcohol would be also highly recommended. - PSG Sleep Study; Future  5. Coronary artery disease due to lipid rich plaque Stable at this time he is to continue to follow-up with the patient primary care team  6. Other  parasomnia As discussed above he has parasomnia sounds like sleepwalking and discussed the importance of prevention of potential injury with the patient.  This can result as a result of the sleepwalking which he states that he has been exhibiting. - PSG Sleep Study; Future   General Counseling: I have discussed the findings of the evaluation and examination with Remo Lipps.  I have also discussed any further diagnostic evaluation thatmay be needed or ordered today. Kacyn verbalizes understanding of the findings of todays visit. We also reviewed his medications today and discussed drug interactions and side effects including but not limited excessive drowsiness and altered mental states. We also discussed that there is always a risk not just to him but also people around him. he has been encouraged to call the office with any questions or concerns that should arise related to todays visit.  Orders Placed This Encounter  Procedures   Pulmonary function test    Standing Status:   Future    Standing Expiration Date:   04/24/2023    Order Specific Question:   Where should this test be performed?    Answer:   [facility]   PSG Sleep Study    Standing Status:   Future    Standing Expiration Date:   04/24/2023    Order Specific Question:   Where should this test be performed:    Answer:   [facility]     Time spent: 49  I have personally obtained a history, examined the patient, evaluated laboratory and imaging results, formulated the assessment and plan and placed orders.    Allyne Gee, MD [facility] Pulmonary and Critical Care Sleep medicine

## 2022-05-05 ENCOUNTER — Telehealth: Payer: Self-pay | Admitting: Internal Medicine

## 2022-05-05 NOTE — Telephone Encounter (Signed)
SS order placed in [facility] folder-Toni 

## 2022-05-13 ENCOUNTER — Telehealth: Payer: Self-pay | Admitting: Internal Medicine

## 2022-05-13 NOTE — Telephone Encounter (Signed)
Per Helene Kelp with [facility]. Patient was scheduled for home SS. In order to send machine to patient, they require patient to put CC on file. Patient refused, so SS will not be done.-Toni

## 2022-07-15 ENCOUNTER — Ambulatory Visit (INDEPENDENT_AMBULATORY_CARE_PROVIDER_SITE_OTHER): Payer: Self-pay | Admitting: Internal Medicine

## 2022-07-15 DIAGNOSIS — R0602 Shortness of breath: Secondary | ICD-10-CM

## 2022-07-28 ENCOUNTER — Encounter: Payer: Self-pay | Admitting: Nurse Practitioner

## 2022-07-28 ENCOUNTER — Ambulatory Visit: Payer: Medicaid Other | Admitting: Nurse Practitioner

## 2022-07-28 VITALS — BP 136/68 | HR 66 | Temp 98.4°F | Resp 16 | Ht 63.0 in | Wt 172.6 lb

## 2022-07-28 DIAGNOSIS — J4489 Other specified chronic obstructive pulmonary disease: Secondary | ICD-10-CM | POA: Diagnosis not present

## 2022-07-28 DIAGNOSIS — R0602 Shortness of breath: Secondary | ICD-10-CM | POA: Diagnosis not present

## 2022-07-28 DIAGNOSIS — G4759 Other parasomnia: Secondary | ICD-10-CM | POA: Diagnosis not present

## 2022-07-28 MED ORDER — ADVAIR DISKUS 250-50 MCG/ACT IN AEPB: 1.0000 | INHALATION_SPRAY | Freq: Two times a day (BID) | RESPIRATORY_TRACT | 5 refills | Status: DC

## 2022-07-28 NOTE — Progress Notes (Signed)
[facility] [place], [place] [address]  Internal MEDICINE  Office Visit Note  Patient Name: Marcus [name]  [id number]  [id number]  Date of Service: 07/28/2022  Chief Complaint  Patient presents with   Follow-up    HPI Marcus [name] presents for a follow-up visit for PFT results Review PFT -- normal study, mild obstruction.  Declined sleep study. --insurance will not cover sleep study.  Still having issues with sleep walking.     Current Medication: Outpatient Encounter Medications as of 07/28/2022  Medication Sig   aspirin 81 MG tablet Take 81 mg by mouth daily.   DULoxetine (CYMBALTA) 30 MG capsule Take 30 mg by mouth daily.   hydrOXYzine (VISTARIL) 25 MG capsule Take 25 mg by mouth 2 (two) times daily.   metoprolol succinate (TOPROL-XL) 50 MG 24 hr tablet Take 1 tablet (50 mg total) by mouth daily. Take with or immediately following a meal.   nitroGLYCERIN (NITROSTAT) 0.4 MG SL tablet SMARTSIG:1 Sublingual Daily   olmesartan-hydrochlorothiazide (BENICAR HCT) 40-12.5 MG tablet Take 1 tablet by mouth daily.   pantoprazole (PROTONIX) 40 MG tablet Take 40 mg by mouth daily.   QUEtiapine (SEROQUEL XR) 50 MG TB24 24 hr tablet Take 50 mg by mouth daily. Take 2 tablets by mouth daily at bedtime   rosuvastatin (CRESTOR) 40 MG tablet Take 40 mg by mouth daily.   VENTOLIN HFA 108 (90 Base) MCG/ACT inhaler SMARTSIG:2 Puff(s) By Mouth Every 4 Hours PRN   [DISCONTINUED] ADVAIR DISKUS 250-50 MCG/ACT AEPB 2 (two) times daily.   ADVAIR DISKUS 250-50 MCG/ACT AEPB Inhale 1 puff into the lungs in the morning and at bedtime.   No facility-administered encounter medications on file as of 07/28/2022.    Surgical History: Past Surgical History:  Procedure Laterality Date   CARDIAC CATHETERIZATION  06/28/2014   x1 stent   NISSEN FUNDOPLICATION     stomach ulcer surgery      Medical History: Past Medical History:  Diagnosis Date   CAD in native artery    a. 06/28/2014:  STEMI, cath LM nl, mLAD 50%, mLCx 20%, OM1 small in size 80%, ramus 100% s/p PCI/DES 0%, EF 40%    Cocaine abuse (HCC)    Depression    H/O gastric ulcer    Hyperglycemia    a. A1C 5.6%   Hypertension    Ischemic cardiomyopathy    a. EF 40% by cath 06/28/2014   MI (myocardial infarction) ([place])    Polysubstance abuse ([place])    a. cocaine, tobacco, and alcohol    Tobacco abuse     Family History: Family History  Problem Relation Age of Onset   Heart failure Mother    Stroke Father    Heart failure Father    Prostate cancer Brother    Heart attack Brother     Social History   Socioeconomic History   Marital status: Single    Spouse name: Not on file   Number of children: Not on file   Years of education: Not on file   Highest education level: Not on file  Occupational History   Not on file  Tobacco Use   Smoking status: Every Day    Packs/day: 1.00    Years: 38.00    Total pack years: 38.00    Types: Cigarettes   Smokeless tobacco: Never  Vaping Use   Vaping Use: Never used  Substance and Sexual Activity   Alcohol use: Yes    Comment: every weekend  6-12 beer   Drug use: No    Types: Cocaine, Marijuana   Sexual activity: Not on file  Other Topics Concern   Not on file  Social History Narrative   Not on file   Social Determinants of Health   Financial Resource Strain: Not on file  Food Insecurity: Not on file  Transportation Needs: Not on file  Physical Activity: Not on file  Stress: Not on file  Social Connections: Not on file  Intimate Partner Violence: Not on file      Review of Systems  Constitutional:  Negative for chills, fatigue and unexpected weight change.  HENT:  Negative for congestion, rhinorrhea, sneezing and sore throat.   Respiratory:  Negative for cough, chest tightness and shortness of breath.   Cardiovascular:  Negative for chest pain and palpitations.  Gastrointestinal:  Negative for abdominal pain, constipation, diarrhea, nausea  and vomiting.  Skin:  Negative for rash.  Psychiatric/Behavioral:  Behavioral problem: Depression.     Vital Signs: BP 136/68   Pulse 66   Temp 98.4 F (36.9 C)   Resp 16   Ht '5\' 3"'$  (1.6 m)   Wt 172 lb 9.6 oz (78.3 kg)   SpO2 98%   BMI 30.57 kg/m    Physical Exam Vitals reviewed.  Constitutional:      General: He is not in acute distress.    Appearance: Normal appearance. He is obese. He is not ill-appearing.  HENT:     Head: Normocephalic and atraumatic.  Eyes:     Pupils: Pupils are equal, round, and reactive to light.  Cardiovascular:     Rate and Rhythm: Normal rate and regular rhythm.     Heart sounds: Normal heart sounds. No murmur heard. Pulmonary:     Effort: Pulmonary effort is normal. No respiratory distress.     Breath sounds: Normal breath sounds. No wheezing.  Neurological:     Mental Status: He is alert and oriented to person, place, and time.  Psychiatric:        Mood and Affect: Mood normal.        Behavior: Behavior normal.        Assessment/Plan: 1. Obstructive chronic bronchitis without exacerbation Stable, using advair as prescribed, continue .   2. Shortness of breath PFT results were normal, repeat PFT annually  3. Other parasomnia Sleepwalking continues. Cannot afford sleep study, not covered by insurance. Follow up and readdress in 3 months.    General Counseling: erbie arment understanding of the findings of todays visit and agrees with plan of treatment. I have discussed any further diagnostic evaluation that may be needed or ordered today. We also reviewed his medications today. he has been encouraged to call the office with any questions or concerns that should arise related to todays visit.    No orders of the defined types were placed in this encounter.   Meds ordered this encounter  Medications   ADVAIR DISKUS 250-50 MCG/ACT AEPB    Sig: Inhale 1 puff into the lungs in the morning and at bedtime.    Dispense:  60  each    Refill:  5    Return in about 3 months (around 10/27/2022) for F/U, pulmonary/sleep with DSK.   Total time spent:30 Minutes Time spent includes review of chart, medications, test results, and follow up plan with the patient.   [place] Controlled Substance Database was reviewed by me.  This patient was seen by Jonetta Osgood, FNP-C in collaboration with Dr. Latricia Heft  Humphrey Rolls as a part of collaborative care agreement.   Lesslie Mossa R. Valetta Fuller, MSN, FNP-C Internal medicine

## 2022-07-30 ENCOUNTER — Other Ambulatory Visit: Payer: Self-pay | Admitting: Nurse Practitioner

## 2022-07-30 NOTE — Telephone Encounter (Signed)
Please take care!

## 2022-07-31 ENCOUNTER — Telehealth: Payer: Self-pay

## 2022-07-31 ENCOUNTER — Other Ambulatory Visit: Payer: Self-pay | Admitting: Nurse Practitioner

## 2022-07-31 NOTE — Telephone Encounter (Signed)
Patient was approved by insurance please rerun it. Thanks

## 2022-07-31 NOTE — Telephone Encounter (Signed)
Completed P.A. and received an approval for patient's Fluticasone-Salmeterol.

## 2022-07-31 NOTE — Telephone Encounter (Signed)
Patient was approved for Fluticasone-salmeterol. Patient notified.

## 2022-08-12 ENCOUNTER — Encounter: Payer: Self-pay | Admitting: *Deleted

## 2022-08-12 ENCOUNTER — Other Ambulatory Visit
Admission: RE | Admit: 2022-08-12 | Discharge: 2022-08-12 | Disposition: A | Discharge status: Home / Self Care | Payer: Medicaid Other | Source: Ambulatory Visit | Attending: Physician Assistant | Admitting: Physician Assistant

## 2022-08-12 ENCOUNTER — Encounter
Admission: RE | Disposition: A | Discharge status: Home / Self Care | Payer: Self-pay | Source: Home / Self Care | Attending: Internal Medicine

## 2022-08-12 ENCOUNTER — Ambulatory Visit: Payer: Medicaid Other | Admitting: Anesthesiology

## 2022-08-12 ENCOUNTER — Ambulatory Visit
Admission: RE | Admit: 2022-08-12 | Discharge: 2022-08-12 | Disposition: A | Discharge status: Home / Self Care | Payer: Medicaid Other | Attending: Internal Medicine | Admitting: Internal Medicine

## 2022-08-12 ENCOUNTER — Encounter: Payer: Self-pay | Admitting: Internal Medicine

## 2022-08-12 ENCOUNTER — Ambulatory Visit: Payer: Medicaid Other | Attending: Physician Assistant | Admitting: Physician Assistant

## 2022-08-12 ENCOUNTER — Ambulatory Visit: Payer: Medicaid Other

## 2022-08-12 ENCOUNTER — Encounter: Payer: Self-pay | Admitting: Physician Assistant

## 2022-08-12 VITALS — BP 120/78 | HR 71 | Ht 63.0 in | Wt 167.0 lb

## 2022-08-12 DIAGNOSIS — Z1211 Encounter for screening for malignant neoplasm of colon: Secondary | ICD-10-CM | POA: Diagnosis present

## 2022-08-12 DIAGNOSIS — E785 Hyperlipidemia, unspecified: Secondary | ICD-10-CM

## 2022-08-12 DIAGNOSIS — F17210 Nicotine dependence, cigarettes, uncomplicated: Secondary | ICD-10-CM | POA: Insufficient documentation

## 2022-08-12 DIAGNOSIS — R55 Syncope and collapse: Secondary | ICD-10-CM

## 2022-08-12 DIAGNOSIS — Z980 Intestinal bypass and anastomosis status: Secondary | ICD-10-CM | POA: Diagnosis not present

## 2022-08-12 DIAGNOSIS — D128 Benign neoplasm of rectum: Secondary | ICD-10-CM | POA: Diagnosis not present

## 2022-08-12 DIAGNOSIS — I25110 Atherosclerotic heart disease of native coronary artery with unstable angina pectoris: Secondary | ICD-10-CM | POA: Diagnosis not present

## 2022-08-12 DIAGNOSIS — Z903 Acquired absence of stomach [part of]: Secondary | ICD-10-CM | POA: Diagnosis not present

## 2022-08-12 DIAGNOSIS — K219 Gastro-esophageal reflux disease without esophagitis: Secondary | ICD-10-CM | POA: Insufficient documentation

## 2022-08-12 DIAGNOSIS — I110 Hypertensive heart disease with heart failure: Secondary | ICD-10-CM | POA: Insufficient documentation

## 2022-08-12 DIAGNOSIS — K5730 Diverticulosis of large intestine without perforation or abscess without bleeding: Secondary | ICD-10-CM | POA: Insufficient documentation

## 2022-08-12 DIAGNOSIS — I509 Heart failure, unspecified: Secondary | ICD-10-CM | POA: Diagnosis not present

## 2022-08-12 DIAGNOSIS — Z8711 Personal history of peptic ulcer disease: Secondary | ICD-10-CM | POA: Diagnosis not present

## 2022-08-12 DIAGNOSIS — R1013 Epigastric pain: Secondary | ICD-10-CM | POA: Insufficient documentation

## 2022-08-12 DIAGNOSIS — I255 Ischemic cardiomyopathy: Secondary | ICD-10-CM | POA: Insufficient documentation

## 2022-08-12 DIAGNOSIS — I5020 Unspecified systolic (congestive) heart failure: Secondary | ICD-10-CM

## 2022-08-12 DIAGNOSIS — I2510 Atherosclerotic heart disease of native coronary artery without angina pectoris: Secondary | ICD-10-CM | POA: Insufficient documentation

## 2022-08-12 DIAGNOSIS — F1910 Other psychoactive substance abuse, uncomplicated: Secondary | ICD-10-CM

## 2022-08-12 DIAGNOSIS — K640 First degree hemorrhoids: Secondary | ICD-10-CM | POA: Diagnosis not present

## 2022-08-12 DIAGNOSIS — Z955 Presence of coronary angioplasty implant and graft: Secondary | ICD-10-CM | POA: Diagnosis not present

## 2022-08-12 DIAGNOSIS — I10 Essential (primary) hypertension: Secondary | ICD-10-CM

## 2022-08-12 HISTORY — PX: ESOPHAGOGASTRODUODENOSCOPY: SHX5428

## 2022-08-12 HISTORY — PX: COLONOSCOPY: SHX5424

## 2022-08-12 LAB — COMPREHENSIVE METABOLIC PANEL
ALT: 36 U/L (ref 0–44)
AST: 31 U/L (ref 15–41)
Albumin: 4.2 g/dL (ref 3.5–5.0)
Alkaline Phosphatase: 60 U/L (ref 38–126)
Anion gap: 6 (ref 5–15)
BUN: 23 mg/dL — ABNORMAL HIGH (ref 6–20)
CO2: 30 mmol/L (ref 22–32)
Calcium: 9.2 mg/dL (ref 8.9–10.3)
Chloride: 102 mmol/L (ref 98–111)
Creatinine, Ser: 1.36 mg/dL — ABNORMAL HIGH (ref 0.61–1.24)
GFR, Estimated: >60 mL/min (ref >60)
Glucose, Bld: 84 mg/dL (ref 70–99)
Potassium: 4.3 mmol/L (ref 3.5–5.1)
Sodium: 138 mmol/L (ref 135–145)
Total Bilirubin: 0.6 mg/dL (ref 0.3–1.2)
Total Protein: 7.7 g/dL (ref 6.5–8.1)

## 2022-08-12 LAB — LIPID PANEL
Cholesterol: 138 mg/dL (ref 0–200)
HDL: 61 mg/dL (ref >40)
LDL Cholesterol: 48 mg/dL (ref 0–99)
Total CHOL/HDL Ratio: 2.3 RATIO
Triglycerides: 143 mg/dL (ref <150)
VLDL: 29 mg/dL (ref 0–40)

## 2022-08-12 LAB — CBC
HCT: 41.5 % (ref 39.0–52.0)
Hemoglobin: 13.9 g/dL (ref 13.0–17.0)
MCH: 30.4 pg (ref 26.0–34.0)
MCHC: 33.5 g/dL (ref 30.0–36.0)
MCV: 90.8 fL (ref 80.0–100.0)
Platelets: 316 10*3/uL (ref 150–400)
RBC: 4.57 MIL/uL (ref 4.22–5.81)
RDW: 13.3 % (ref 11.5–15.5)
WBC: 7.9 10*3/uL (ref 4.0–10.5)
nRBC: 0 % (ref 0.0–0.2)

## 2022-08-12 LAB — LDL CHOLESTEROL, DIRECT: Direct LDL: 45 mg/dL (ref 0–99)

## 2022-08-12 SURGERY — COLONOSCOPY
Anesthesia: General

## 2022-08-12 MED ORDER — PHENYLEPHRINE 80 MCG/ML (10ML) SYRINGE FOR IV PUSH (FOR BLOOD PRESSURE SUPPORT)
PREFILLED_SYRINGE | INTRAVENOUS | Status: DC | PRN
  Administered 2022-08-12 (×3): 80 ug via INTRAVENOUS

## 2022-08-12 MED ORDER — PROPOFOL 10 MG/ML IV BOLUS
INTRAVENOUS | Status: AC
  Filled 2022-08-12: qty 20

## 2022-08-12 MED ORDER — LIDOCAINE HCL (PF) 2 % IJ SOLN
INTRAMUSCULAR | Status: AC
  Filled 2022-08-12: qty 5

## 2022-08-12 MED ORDER — DEXMEDETOMIDINE HCL IN NACL 80 MCG/20ML IV SOLN
INTRAVENOUS | Status: DC | PRN
  Administered 2022-08-12 (×2): 4 ug via BUCCAL

## 2022-08-12 MED ORDER — PROPOFOL 10 MG/ML IV BOLUS
INTRAVENOUS | Status: DC | PRN
  Administered 2022-08-12 (×2): 50 mg via INTRAVENOUS

## 2022-08-12 MED ORDER — PROPOFOL 1000 MG/100ML IV EMUL
INTRAVENOUS | Status: AC
  Filled 2022-08-12: qty 100

## 2022-08-12 MED ORDER — PROPOFOL 500 MG/50ML IV EMUL
INTRAVENOUS | Status: DC | PRN
  Administered 2022-08-12: 200 ug/kg/min via INTRAVENOUS

## 2022-08-12 MED ORDER — LIDOCAINE HCL (CARDIAC) PF 100 MG/5ML IV SOSY
PREFILLED_SYRINGE | INTRAVENOUS | Status: DC | PRN
  Administered 2022-08-12: 50 mg via INTRAVENOUS
  Administered 2022-08-12: 25 mg via INTRAVENOUS

## 2022-08-12 MED ORDER — SODIUM CHLORIDE 0.9 % IV SOLN
INTRAVENOUS | Status: DC
  Administered 2022-08-12: 1000 mL via INTRAVENOUS

## 2022-08-12 NOTE — Interval H&P Note (Signed)
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  The various methods of treatment have been discussed with the patient and family. After consideration of risks, benefits and other options for treatment, the patient has consented to  Procedure(s): COLONOSCOPY (N/A) ESOPHAGOGASTRODUODENOSCOPY (EGD) (N/A) as a surgical intervention.  The patient's history has been reviewed, patient examined, no change in status, stable for surgery.  I have reviewed the patient's chart and labs.  Questions were answered to the patient's satisfaction.     [name], [name]

## 2022-08-12 NOTE — Patient Instructions (Signed)
Medication Instructions:  Your physician recommends that you continue on your current medications as directed. Please refer to the Current Medication list given to you today.   *If you need a refill on your cardiac medications before your next appointment, please call your pharmacy*   Lab Work: Your physician recommends you get your labs drawn at the medical mall  If you have labs (blood work) drawn today and your tests are completely normal, you will receive your results only by: [place] (if you have MyChart) OR A paper copy in the mail If you have any lab test that is abnormal or we need to change your treatment, we will call you to review the results.   Testing/Procedures: Your physician has requested that you have an echocardiogram. Echocardiography is a painless test that uses sound waves to create images of your heart. It provides your doctor with information about the size and shape of your heart and how well your heart's chambers and valves are working. This procedure takes approximately one hour. There are no restrictions for this procedure. Please do NOT wear cologne, perfume, aftershave, or lotions (deodorant is allowed). Please arrive 15 minutes prior to your appointment time.   Your physician has requested that you have a carotid duplex. This test is an ultrasound of the carotid arteries in your neck. It looks at blood flow through these arteries that supply the brain with blood. Allow one hour for this exam. There are no restrictions or special instructions.   Your physician has recommended that you wear a Zio monitor.   This monitor is a medical device that records the heart's electrical activity. Doctors most often use these monitors to diagnose arrhythmias. Arrhythmias are problems with the speed or rhythm of the heartbeat. The monitor is a small device applied to your chest. You can wear one while you do your normal daily activities. While wearing this monitor if you  have any symptoms to push the button and record what you felt. Once you have worn this monitor for the period of time provider prescribed (Usually 14 days), you will return the monitor device in the postage paid box. Once it is returned they will download the data collected and provide [place] with a report which the provider will then review and we will call you with those results. Important tips:  Avoid showering during the first 24 hours of wearing the monitor. Avoid excessive sweating to help maximize wear time. Do not submerge the device, no hot tubs, and no swimming pools. Keep any lotions or oils away from the patch. After 24 hours you may shower with the patch on. Take brief showers with your back facing the shower head.  Do not remove patch once it has been placed because that will interrupt data and decrease adhesive wear time. Push the button when you have any symptoms and write down what you were feeling. Once you have completed wearing your monitor, remove and place into box which has postage paid and place in your outgoing mailbox.  If for some reason you have misplaced your box then call our office and we can provide another box and/or mail it off for you.   [facility] Cardiac Cath Instructions  You are scheduled for a Cardiac Cath on: Tuesday 08/18/2022 Please arrive at  7:30 am on the day of your procedure Please expect a call from our [facility] to pre-register you Do not eat/drink anything after midnight Someone will need to drive you home  It is recommended someone be with you for the first 24 hours after your procedure Wear clothes that are easy to get on/off and wear slip on shoes if possible   Medications bring a current list of all medications with you  ___ You may take all of your medications the morning of your procedure with enough water to swallow safely  Day of your procedure: Arrive at the [place] entrance.  Free valet service is available.  After  entering the [place] please check-in at the registration desk (1st desk on your right) to receive your armband. After receiving your armband someone will escort you to the cardiac cath/special procedures waiting area.  The usual length of stay after your procedure is about 2 to 3 hours.  This can vary.  If you have any questions, please call our office at [phone], or you may call the cardiac cath lab at [facility] directly at [phone]     Follow-Up: At [facility], you and your health needs are our priority.  As part of our continuing mission to provide you with exceptional heart care, we have created designated Provider Care Teams.  These Care Teams include your primary Cardiologist (physician) and Advanced Practice Providers (APPs -  Physician Assistants and Nurse Practitioners) who all work together to provide you with the care you need, when you need it.  We recommend signing up for the patient portal called "MyChart".  Sign up information is provided on this After Visit Summary.  MyChart is used to connect with patients for Virtual Visits (Telemedicine).  Patients are able to view lab/test results, encounter notes, upcoming appointments, etc.  Non-urgent messages can be sent to your provider as well.   To learn more about what you can do with MyChart, go to [email or web address].    Your next appointment:   1 month(s)  Provider:   You may see Marcus Rogue, MD or one of the following Advanced Practice Providers on your designated Care Team:    Marcus [name], [place]

## 2022-08-12 NOTE — Transfer of Care (Signed)
Immediate Anesthesia Transfer of Care Note  Patient: Marcus [name]  Procedure(s) Performed: COLONOSCOPY ESOPHAGOGASTRODUODENOSCOPY (EGD)  Patient Location: PACU  Anesthesia Type:General  Level of Consciousness: drowsy  Airway & Oxygen Therapy: Patient Spontanous Breathing and Patient connected to face mask oxygen  Post-op Assessment: Report given to RN and Post -op Vital signs reviewed and stable  Post vital signs: Reviewed and stable  Last Vitals:  Vitals Value Taken Time  BP 115/65 08/12/22 1059  Temp 36.2 C 08/12/22 1055  Pulse 76 08/12/22 1059  Resp 17 08/12/22 1059  SpO2 95 % 08/12/22 1059  Vitals shown include unvalidated device data.  Last Pain:  Vitals:   08/12/22 1057  TempSrc:   PainSc: Asleep         Complications: No notable events documented.

## 2022-08-12 NOTE — H&P (View-Only) (Signed)
Cardiology Office Note    Date:  08/12/2022   ID:  Marcus [name], DOB 11/18/1965, MRN [id number]  PCP:  Dianna Rossetti, NP  Cardiologist:  Ida Rogue, MD  Electrophysiologist:  None   Chief Complaint: Follow-up  History of Present Illness:   Marcus [name] is a 57 y.o. male with history of CAD with ST elevation MI in 06/2014 status post PCI/DES to the ramus intermedius, HFrEF secondary to ICM, polysubstance use including EtOH, tobacco, and cocaine use, HTN, and HLD who presents for follow-up of his CAD and cardiomyopathy.  He was admitted to the hospital on 06/2014 with an ST elevation MI with LHC showing an occluded ramus intermedius status post PCI/DES.  Remaining cath details included mid LAD 50% stenosis, mid LCx 20% stenosis, and OM1 80% stenosis.  EF 40%.  Urine drug screen positive for cocaine at that time.  He followed up in 06/2014 and was was not taking his medications, including aspirin and Effient.  He was lost to follow-up until he reestablished care with our office in 01/2022.  At that time, he was not on Effient.  He reported left-sided chest pain, left arm pain, and left arm numbness.  He continues to smoke 1 pack/day.  Subsequent Lexiscan MPI in 01/2022 showed no significant ischemia with a large region of fixed defect along the lateral wall consistent with prior MI and an EF of 52%.  Overall, this was a moderate risk scan given size of prior MI.  He comes in today continuing to note sharp left-sided chest pain that radiates to the left shoulder and at times into the left arm with associated numbness of the left arm.  Symptoms are randomly occurring and will last for approximately 20 to 40 minutes followed by spontaneous resolution.  With this discomfort there is some associated shortness of breath.  Symptoms feel similar to what he was experiencing leading up to his MI.  He is having the symptoms approximately once per week, last occurring 1 week ago.  Currently without  symptoms of angina or cardiac decompensation.  He also reports 2 episodes of syncope since he was last seen that occurred while he was ambulating in his house.  These episodes were not associated with tachypalpitations, chest pain, diaphoresis, or preceding dizziness.  No loss of bowel or bladder function.  He reports he had been off all medications from late 2015 until establishing with a PCP in mid 2023.  Since then, he has been adherent to his medications.  He denies any symptoms of hematochezia or melena.  He continues to smoke a little under 1 pack/day and is not interested in fully quitting.  He has reduced his beer intake from 18-24 beers per day to approximately 6 beers 1 night per week.  He reports he has not used cocaine since 2015.  He is in the office today with one of his friends, who drove.   Labs independently reviewed: 10/2016 - Hgb 14.3, PLT 387, potassium 4.0, BUN 13, serum creatinine 0.9, urine drug screen positive for cocaine 06/2014 - TC 140, TG 176, HDL 45, LDL 60, A1c 5.6, albumin 3.7, AST 257, ALT normal  Past Medical History:  Diagnosis Date   CAD in native artery    a. 06/28/2014: STEMI, cath LM nl, mLAD 50%, mLCx 20%, OM1 small in size 80%, ramus 100% s/p PCI/DES 0%, EF 40%    Cocaine abuse (HCC)    Depression    H/O gastric ulcer    Hyperglycemia  a. A1C 5.6%   Hypertension    Ischemic cardiomyopathy    a. EF 40% by cath 06/28/2014   MI (myocardial infarction) ([place])    Polysubstance abuse ([place])    a. cocaine, tobacco, and alcohol    Tobacco abuse     Past Surgical History:  Procedure Laterality Date   CARDIAC CATHETERIZATION  06/28/2014   x1 stent   NISSEN FUNDOPLICATION     stomach ulcer surgery      Current Medications: No outpatient medications have been marked as taking for the 08/12/22 encounter (Office Visit) with Rise Mu, PA-C.    Allergies:   Oxycodone-acetaminophen, Percocet [oxycodone-acetaminophen], and Codeine   Social History    Socioeconomic History   Marital status: Single    Spouse name: Not on file   Number of children: Not on file   Years of education: Not on file   Highest education level: Not on file  Occupational History   Not on file  Tobacco Use   Smoking status: Every Day    Packs/day: 1.00    Years: 38.00    Total pack years: 38.00    Types: Cigarettes   Smokeless tobacco: Never  Vaping Use   Vaping Use: Never used  Substance and Sexual Activity   Alcohol use: Yes    Comment: every weekend 6-12 beer   Drug use: No    Types: Cocaine, Marijuana    Comment: none in at least 2 years   Sexual activity: Not on file  Other Topics Concern   Not on file  Social History Narrative   Not on file   Social Determinants of Health   Financial Resource Strain: Not on file  Food Insecurity: Not on file  Transportation Needs: Not on file  Physical Activity: Not on file  Stress: Not on file  Social Connections: Not on file     Family History:  The patient's family history includes Heart attack in his brother; Heart failure in his father and mother; Prostate cancer in his brother; Stroke in his father.  ROS:   12-point review of systems is negative unless otherwise noted in HPI.   EKGs/Labs/Other Studies Reviewed:    Studies reviewed were summarized above. The additional studies were reviewed today:  Lexiscan MPI 02/13/2022: Pharmacological myocardial perfusion imaging study with no significant  ischemia Large region fixed defect lateral wall consistent with prior MI Normal wall motion, EF estimated at 52% No EKG changes concerning for ischemia at peak stress or in recovery. Moderate risk scan given size of prior MI __________  [facility] 06/28/2014: Mid LAD 50% stenosis, mid LCx 20% stenosis, OM1 80% stenosis, ramus 100% stenosis status post PCI/DES.  EF 40%.   EKG:  EKG is ordered today.  The EKG ordered today demonstrates NSR, 71 bpm, RBBB with possible prior septal and lateral infarct,  consistent with prior tracing  Recent Labs: 08/12/2022: Hemoglobin 13.9; Platelets 316  Recent Lipid Panel    Component Value Date/Time   CHOL 140 06/29/2014 0353   TRIG 176 06/29/2014 0353   HDL 45 06/29/2014 0353   VLDL 35 06/29/2014 0353   LDLCALC 60 06/29/2014 0353    PHYSICAL EXAM:    VS:  BP 120/78 (BP Location: Left Arm, Patient Position: Sitting, Cuff Size: Normal)   Pulse 71   Ht '5\' 3"'$  (1.6 m)   Wt 167 lb (75.8 kg)   SpO2 98%   BMI 29.58 kg/m   BMI: Body mass index is 29.58 kg/m.  Physical  Exam Vitals reviewed.  Constitutional:      Appearance: He is well-developed.  HENT:     Head: Normocephalic and atraumatic.  Eyes:     General:        Right eye: No discharge.        Left eye: No discharge.  Neck:     Vascular: No JVD.  Cardiovascular:     Rate and Rhythm: Normal rate and regular rhythm.     Pulses:          Posterior tibial pulses are 2+ on the right side and 2+ on the left side.     Heart sounds: Normal heart sounds, S1 normal and S2 normal. Heart sounds not distant. No midsystolic click and no opening snap. No murmur heard.    No friction rub.  Pulmonary:     Effort: Pulmonary effort is normal. No respiratory distress.     Breath sounds: Normal breath sounds. No decreased breath sounds, wheezing or rales.  Chest:     Chest wall: No tenderness.  Abdominal:     General: There is no distension.  Musculoskeletal:     Cervical back: Normal range of motion.     Right lower leg: No edema.     Left lower leg: No edema.  Skin:    General: Skin is warm and dry.     Nails: There is no clubbing.  Neurological:     Mental Status: He is alert and oriented to person, place, and time.  Psychiatric:        Speech: Speech normal.        Behavior: Behavior normal.        Thought Content: Thought content normal.        Judgment: Judgment normal.     Wt Readings from Last 3 Encounters:  08/12/22 167 lb (75.8 kg)  08/12/22 163 lb 5.4 oz (74.1 kg)   07/28/22 172 lb 9.6 oz (78.3 kg)     ASSESSMENT & PLAN:   CAD involving the native coronary arteries with unstable angina: Currently without symptoms of angina or cardiac decompensation.  Symptoms feel similar to his prior angina leading up to his MI.  Patient has numerous risk factors for progression of ischemic heart disease including ongoing tobacco use, long history of medication nonadherence, and uncertain blood glucose/cholesterol levels.  Given concerning symptoms we will proceed with diagnostic LHC with further recommendations pending results.  Continue aggressive risk factor modification and secondary prevention including aspirin, metoprolol, olmesartan, and rosuvastatin.  Obtain precath labs to ensure stable electrolytes and renal function prior to IV contrast.  Syncope: Concerning for arrhythmogenic event given sudden onset while walking without recent positional change.  No associated pro or post drome.  Ischemic evaluation as outlined above.  Obtain echo and carotid artery ultrasound.  Place ZIO AT.  No driving for 6 months from date of last syncopal episode (last week) or until an identifiable cause has been adequately treated.  HFrEF secondary to ICM: Appears euvolemic and well compensated.  EF noted to be improved at 52% during [place] MPI in 01/2022.  Currently on metoprolol tartrate and olmesartan.  Not requiring a standing loop diuretic.  Obtain echo as outlined above with recommendation to escalate GDMT as indicated.  HTN: Blood pressure is well-controlled in the office today.  He remains on Toprol-XL and Benicar HCT.  Followed by PCP.  HLD: No recent lipid panel on file for review with goal LDL being at least less than 70.  Currently  on atorvastatin 40 mg daily.  Obtain CMP, lipid panel, and direct LDL.  Polysubstance use: Continues to smoke a little less than 1 pack/day.  Not interested in quitting.  Has decreased beer intake from 18 to 24/day to approximately 6 beers 1 night  per week.  Reports he has not used cocaine since 2015.   Shared Decision Making/Informed Consent{  The risks [stroke (1 in 1000), death (1 in 1000), kidney failure [usually temporary] (1 in 500), bleeding (1 in 200), allergic reaction [possibly serious] (1 in 200)], benefits (diagnostic support and management of coronary artery disease) and alternatives of a cardiac catheterization were discussed in detail with Mr. Tutterow and he is willing to proceed.     Disposition: F/u with Dr. Rockey Situ or an APP in 1 month.   Medication Adjustments/Labs and Tests Ordered: Current medicines are reviewed at length with the patient today.  Concerns regarding medicines are outlined above. Medication changes, Labs and Tests ordered today are summarized above and listed in the Patient Instructions accessible in Encounters.   Signed, Christell Faith, PA-C 08/12/2022 3:57 PM     [place],  [id number] [phone]

## 2022-08-12 NOTE — H&P (Signed)
Outpatient short stay form Pre-procedure 08/12/2022 8:37 AM Ellianah [name] K. Marcus [name], M.D.  Primary Physician: Dianna Rossetti, NP  Reason for visit:  Epigastric pain, history of Peptic ulcer disease, colon cancer screening  History of present illness:  [name] presents to the [facility] at the request of his PCP for chief complaint of 8-monthhistory of epigastric abdominal pain concerning for recurrent gastric ulcer. He reports over the past 4 months he has been experiencing intermittent epigastric abdominal pain very similar to when he had issues with stomach ulcers. He reports that he has hx of partial gastrectomy 2/2 perforated gastric ulcer and hx of Nissen's fundoplication ~~23years ago. He has not had an endoscopy since he had the surgery. He reports he is currently taking PPI 40 mg daily and is compliant with the dosage. He reports that he has ongoing issues with epigastric abdominal pain described as aching and gnawing in nature. Food has made the pain worse but there have been times where food seems to make it better. He denies any nausea, vomiting, dysphagia, odynophagia, early satiety, hoarseness, or loss of appetite. There are no clear alleviating factors. Pain has awoken him up from sleep on several occasions. He denies any unintentional weight loss. He denies any recent changes in his bowel habits and denies any hematochezia or melena. He has not had his screening colonoscopy. He avoids NSAIDs given hx of previous ulcer complications. He does enjoy drinking beer frequently on the weekends and reports he can have 6-12 beers on a Saturday. He does have hx of previous cocaine history, but reports he has been sober for the past 4 years. He has not had any recent cardiac events. He does follow with Cardiology for hx of ischemic cardiomyopathy and reports he had one stent placed 06/2014.    No current facility-administered medications for this encounter.  Medications Prior to Admission   Medication Sig Dispense Refill Last Dose   aspirin 81 MG tablet Take 81 mg by mouth daily.      DULoxetine (CYMBALTA) 30 MG capsule Take 30 mg by mouth daily.      fluticasone-salmeterol (ADVAIR DISKUS) 250-50 MCG/ACT AEPB Inhale 1 puff into the lungs in the morning and at bedtime. 60 each 5    hydrOXYzine (VISTARIL) 25 MG capsule Take 25 mg by mouth 2 (two) times daily.      metoprolol succinate (TOPROL-XL) 50 MG 24 hr tablet Take 1 tablet (50 mg total) by mouth daily. Take with or immediately following a meal. 90 tablet 3    nitroGLYCERIN (NITROSTAT) 0.4 MG SL tablet SMARTSIG:1 Sublingual Daily      olmesartan-hydrochlorothiazide (BENICAR HCT) 40-12.5 MG tablet Take 1 tablet by mouth daily.      pantoprazole (PROTONIX) 40 MG tablet Take 40 mg by mouth daily.      QUEtiapine (SEROQUEL XR) 50 MG TB24 24 hr tablet Take 50 mg by mouth daily. Take 2 tablets by mouth daily at bedtime      rosuvastatin (CRESTOR) 40 MG tablet Take 40 mg by mouth daily.      VENTOLIN HFA 108 (90 Base) MCG/ACT inhaler SMARTSIG:2 Puff(s) By Mouth Every 4 Hours PRN        Allergies  Allergen Reactions   Percocet [Oxycodone-Acetaminophen]    Codeine Rash     Past Medical History:  Diagnosis Date   CAD in native artery    a. 06/28/2014: STEMI, cath LM nl, mLAD 50%, mLCx 20%, OM1 small in size 80%, ramus 100% s/p  PCI/DES 0%, EF 40%    Cocaine abuse (HCC)    Depression    H/O gastric ulcer    Hyperglycemia    a. A1C 5.6%   Hypertension    Ischemic cardiomyopathy    a. EF 40% by cath 06/28/2014   MI (myocardial infarction) ([place])    Polysubstance abuse ([place])    a. cocaine, tobacco, and alcohol    Tobacco abuse     Review of systems:  Otherwise negative.    Physical Exam  Gen: Alert, oriented. Appears stated age.  HEENT: [place]/AT. PERRLA. Lungs: CTA, no wheezes. CV: RR nl S1, S2. Abd: soft, benign, no masses. BS+ Ext: No edema. Pulses 2+    Planned procedures: Proceed with EGD and colonoscopy. The  patient understands the nature of the planned procedure, indications, risks, alternatives and potential complications including but not limited to bleeding, infection, perforation, damage to internal organs and possible oversedation/side effects from anesthesia. The patient agrees and gives consent to proceed.  Please refer to procedure notes for findings, recommendations and patient disposition/instructions.     Shellie Goettl K. Marcus [name], M.D. Gastroenterology 08/12/2022  8:37 AM

## 2022-08-12 NOTE — Anesthesia Preprocedure Evaluation (Addendum)
Anesthesia Evaluation  Patient identified by MRN, date of birth, ID band Patient awake    Reviewed: Allergy & Precautions, NPO status , Patient's Chart, lab work & pertinent test results  History of Anesthesia Complications Negative for: history of anesthetic complications  Airway Mallampati: IV   Neck ROM: Full    Dental  (+) Poor Dentition   Pulmonary Current Smoker (1 ppd) and Patient abstained from smoking.   Pulmonary exam normal breath sounds clear to auscultation       Cardiovascular hypertension, + CAD (s/p MI and stent) and +CHF (ischemic cardiomyopathy, EF 40%)  Normal cardiovascular exam Rhythm:Regular Rate:Normal  Myocardial perfusion 02/13/22:  Pharmacological myocardial perfusion imaging study with no significant  ischemia Large region fixed defect lateral wall consistent with prior MI Normal wall motion, EF estimated at 52% No EKG changes concerning for ischemia at peak stress or in recovery. Moderate risk scan given size of prior MI  ECG 02/09/22:  Sinus tachycardia rate 105 bpm right bundle branch block unable to exclude old lateral infarct   Neuro/Psych Hx cocaine use disorder, none in 5 years    GI/Hepatic PUD,GERD  ,,  Endo/Other  negative endocrine ROS    Renal/GU negative Renal ROS     Musculoskeletal   Abdominal   Peds  Hematology negative hematology ROS (+)   Anesthesia Other Findings Cardiology note 02/09/22:  Coronary disease with stable angina Reports having recent episodes of left-sided chest pain, left arm pain with symptoms of left arm numbness Some typical and atypical features High risk of recurrent and worsening coronary disease given continued smoking 1 pack/day, hyperlipidemia We have ordered Lexiscan Myoview to rule out ischemia Recommend he start metoprolol succinate 50 mg daily   Essential hypertension Recommend he start metoprolol succinate 50 mg daily Suggest he stop  the HCTZ 12.5 daily and stay on the olmesartan HCTZ 40/12.5 He has not taken his medications this morning, systolic pressure 381-0 40 systolic   Hyperlipidemia Stressed importance of compliance with his Crestor 40 daily   Smoker We have encouraged him to continue to work on weaning his cigarettes and smoking cessation. He will continue to work on this and does not want any assistance with chantix.     Reproductive/Obstetrics                             Anesthesia Physical Anesthesia Plan  ASA: 3  Anesthesia Plan: General   Post-op Pain Management:    Induction: Intravenous  PONV Risk Score and Plan: 1 and Propofol infusion, TIVA and Treatment may vary due to age or medical condition  Airway Management Planned: Natural Airway  Additional Equipment:   Intra-op Plan:   Post-operative Plan:   Informed Consent: I have reviewed the patients History and Physical, chart, labs and discussed the procedure including the risks, benefits and alternatives for the proposed anesthesia with the patient or authorized representative who has indicated his/her understanding and acceptance.       Plan Discussed with: CRNA  Anesthesia Plan Comments: (LMA/GETA backup discussed.  Patient consented for risks of anesthesia including but not limited to:  - adverse reactions to medications - damage to eyes, teeth, lips or other oral mucosa - nerve damage due to positioning  - sore throat or hoarseness - damage to heart, brain, nerves, lungs, other parts of body or loss of life  Informed patient about role of CRNA in peri- and intra-operative care.  Patient voiced understanding.)  Anesthesia Quick Evaluation  

## 2022-08-12 NOTE — Interval H&P Note (Signed)
History and Physical Interval Note:  08/12/2022 10:08 AM  Marcus [name]  has presented today for surgery, with the diagnosis of Abdominal pain, epigastric (R10.13) Dyspepsia (R10.13) Gastroesophageal reflux disease, unspecified whether esophagitis present (K21.9) History of partial gastrectomy (Z90.3) History of gastric ulcer (Z87.11) History of Nissen fundoplication (Z61.096) Colon cancer screening (Z12.11).  The various methods of treatment have been discussed with the patient and family. After consideration of risks, benefits and other options for treatment, the patient has consented to  Procedure(s): COLONOSCOPY (N/A) ESOPHAGOGASTRODUODENOSCOPY (EGD) (N/A) as a surgical intervention.  The patient's history has been reviewed, patient examined, no change in status, stable for surgery.  I have reviewed the patient's chart and labs.  Questions were answered to the patient's satisfaction.     [place], [place]

## 2022-08-12 NOTE — Op Note (Signed)
[facility] Gastroenterology Patient Name: Marcus [name] Procedure Date: 08/12/2022 10:06 AM MRN: [id number] Account #: [id number] Date of Birth: 08-06-1965 Admit Type: Outpatient Age: 57 Room: [facility] ENDO ROOM 2 Gender: Male Note Status: Finalized Instrument Name: Park Meo [id number] Procedure:             Colonoscopy Indications:           Screening for colorectal malignant neoplasm Providers:             Lorie Apley K. Alice Reichert MD, MD Referring MD:          Dianna Rossetti (Referring MD) Medicines:             Propofol per Anesthesia Complications:         No immediate complications. Procedure:             Pre-Anesthesia Assessment:                        - The risks and benefits of the procedure and the                         sedation options and risks were discussed with the                         patient. All questions were answered and informed                         consent was obtained.                        - Patient identification and proposed procedure were                         verified prior to the procedure by the nurse. The                         procedure was verified in the procedure room.                        - ASA Grade Assessment: III - A patient with severe                         systemic disease.                        - After reviewing the risks and benefits, the patient                         was deemed in satisfactory condition to undergo the                         procedure.                        After obtaining informed consent, the colonoscope was                         passed under direct vision. Throughout the procedure,                         the patient's  blood pressure, pulse, and oxygen                         saturations were monitored continuously. The                         Colonoscope was introduced through the anus and                         advanced to the the cecum, identified by appendiceal                          orifice and ileocecal valve. The colonoscopy was                         performed without difficulty. The patient tolerated                         the procedure well. The quality of the bowel                         preparation was adequate. The ileocecal valve,                         appendiceal orifice, and rectum were photographed. Findings:      The perianal and digital rectal examinations were normal. Pertinent       negatives include normal sphincter tone and no palpable rectal lesions.      Non-bleeding internal hemorrhoids were found during retroflexion. The       hemorrhoids were Grade I (internal hemorrhoids that do not prolapse).      Many small-mouthed diverticula were found in the sigmoid colon.      A 15 mm polyp was found in the rectum. The polyp was semi-pedunculated.       The polyp was removed with a hot snare. Resection and retrieval were       complete.      The exam was otherwise without abnormality. Impression:            - Non-bleeding internal hemorrhoids.                        - Diverticulosis in the sigmoid colon.                        - One 15 mm polyp in the rectum, removed with a hot                         snare. Resected and retrieved.                        - The examination was otherwise normal. Recommendation:        - Await pathology results from EGD, also performed                         today.                        - Patient has a contact number available for  emergencies. The signs and symptoms of potential                         delayed complications were discussed with the patient.                         Return to normal activities tomorrow. Written                         discharge instructions were provided to the patient.                        - Resume previous diet.                        - Continue present medications.                        - Repeat colonoscopy is recommended for surveillance.                          The colonoscopy date will be determined after                         pathology results from today's exam become available                         for review.                        - Return to physician assistant in 3 months.                        - Follow up with Laurine Blazer, PA-C at [facility]. [phone].                        - Telephone GI office to schedule appointment.                        - The findings and recommendations were discussed with                         the patient. Procedure Code(s):     --- Professional ---                        [phone], Colonoscopy, flexible; with removal of                         tumor(s), polyp(s), or other lesion(s) by snare                         technique Diagnosis Code(s):     --- Professional ---                        K57.30, Diverticulosis of large intestine without  perforation or abscess without bleeding                        D12.8, Benign neoplasm of rectum                        K64.0, First degree hemorrhoids                        Z12.11, Encounter for screening for malignant neoplasm                         of colon CPT copyright 2022 American Medical Association. All rights reserved. The codes documented in this report are preliminary and upon coder review may  be revised to meet current compliance requirements. Efrain Sella MD, MD 08/12/2022 10:55:23 AM This report has been signed electronically. Number of Addenda: 0 Note Initiated On: 08/12/2022 10:06 AM Scope Withdrawal Time: 0 hours 6 minutes 36 seconds  Total Procedure Duration: 0 hours 12 minutes 16 seconds  Estimated Blood Loss:  Estimated blood loss: none.      [facility]

## 2022-08-12 NOTE — Op Note (Signed)
[facility] Gastroenterology Patient Name: Marcus [name] Procedure Date: 08/12/2022 10:07 AM MRN: [id number] Account #: [id number] Date of Birth: [birth year] Admit Type: Outpatient Age: 57 Room: [facility] ENDO ROOM 2 Gender: Male Note Status: Finalized Instrument Name: Upper Endoscope [id number] Procedure:             Upper GI endoscopy Indications:           Epigastric abdominal pain, Suspected esophageal reflux Providers:             Benay Pike. Alice Reichert MD, MD Referring MD:          Dianna Rossetti (Referring MD) Medicines:             Propofol per Anesthesia Complications:         No immediate complications. Procedure:             Pre-Anesthesia Assessment:                        - The risks and benefits of the procedure and the                         sedation options and risks were discussed with the                         patient. All questions were answered and informed                         consent was obtained.                        - Patient identification and proposed procedure were                         verified prior to the procedure by the nurse. The                         procedure was verified in the procedure room.                        - ASA Grade Assessment: III - A patient with severe                         systemic disease.                        - After reviewing the risks and benefits, the patient                         was deemed in satisfactory condition to undergo the                         procedure.                        After obtaining informed consent, the endoscope was                         passed under direct vision. Throughout the procedure,  the patient's blood pressure, pulse, and oxygen                         saturations were monitored continuously. The Endoscope                         was introduced through the mouth, and advanced to the                         third part of duodenum. The upper GI  endoscopy was                         accomplished without difficulty. The patient tolerated                         the procedure well. Findings:      The esophagus was normal.      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 A gastric       pouch with a large size was found. The gastroduodenal anastomosis was       characterized by healthy appearing mucosa. This was traversed.      Evidence of a Nissen fundoplication was found in the cardia. The wrap       appeared intact. This was traversed.      Patchy mildly erythematous mucosa without bleeding was found in the       gastric body. Biopsies were taken with a cold forceps for Helicobacter       pylori testing.      The examined duodenum was normal. Impression:            - Normal esophagus.                        - Patent Billroth I gastroduodenostomy was found,                         characterized by healthy appearing mucosa.                        - A Nissen fundoplication was found. The wrap appears                         intact.                        - Erythematous mucosa in the gastric body. Biopsied.                        - Normal examined duodenum. Recommendation:        - Await pathology results.                        - Proceed with colonoscopy Procedure Code(s):     --- Professional ---                        [phone], Esophagogastroduodenoscopy, flexible,                         transoral; with biopsy, single or multiple Diagnosis Code(s):     --- Professional ---  R10.13, Epigastric pain                        K31.89, Other diseases of stomach and duodenum                        Z98.890, Other specified postprocedural states                        Z98.0, Intestinal bypass and anastomosis status CPT copyright 2022 American Medical Association. All rights reserved. The codes documented in this report are preliminary and upon coder review may  be revised to meet current compliance  requirements. Efrain Sella MD, MD 08/12/2022 10:37:01 AM This report has been signed electronically. Number of Addenda: 0 Note Initiated On: 08/12/2022 10:07 AM Estimated Blood Loss:  Estimated blood loss: none.      [facility]

## 2022-08-12 NOTE — Progress Notes (Signed)
Cardiology Office Note    Date:  08/12/2022   ID:  Marcus [name], DOB 1965/09/30, MRN [id number]  PCP:  Dianna Rossetti, NP  Cardiologist:  Ida Rogue, MD  Electrophysiologist:  None   Chief Complaint: Follow-up  History of Present Illness:   Marcus [name] is a 57 y.o. male with history of CAD with ST elevation MI in 06/2014 status post PCI/DES to the ramus intermedius, HFrEF secondary to ICM, polysubstance use including EtOH, tobacco, and cocaine use, HTN, and HLD who presents for follow-up of his CAD and cardiomyopathy.  He was admitted to the hospital on 06/2014 with an ST elevation MI with LHC showing an occluded ramus intermedius status post PCI/DES.  Remaining cath details included mid LAD 50% stenosis, mid LCx 20% stenosis, and OM1 80% stenosis.  EF 40%.  Urine drug screen positive for cocaine at that time.  He followed up in 06/2014 and was was not taking his medications, including aspirin and Effient.  He was lost to follow-up until he reestablished care with our office in 01/2022.  At that time, he was not on Effient.  He reported left-sided chest pain, left arm pain, and left arm numbness.  He continues to smoke 1 pack/day.  Subsequent Lexiscan MPI in 01/2022 showed no significant ischemia with a large region of fixed defect along the lateral wall consistent with prior MI and an EF of 52%.  Overall, this was a moderate risk scan given size of prior MI.  He comes in today continuing to note sharp left-sided chest pain that radiates to the left shoulder and at times into the left arm with associated numbness of the left arm.  Symptoms are randomly occurring and will last for approximately 20 to 40 minutes followed by spontaneous resolution.  With this discomfort there is some associated shortness of breath.  Symptoms feel similar to what he was experiencing leading up to his MI.  He is having the symptoms approximately once per week, last occurring 1 week ago.  Currently without  symptoms of angina or cardiac decompensation.  He also reports 2 episodes of syncope since he was last seen that occurred while he was ambulating in his house.  These episodes were not associated with tachypalpitations, chest pain, diaphoresis, or preceding dizziness.  No loss of bowel or bladder function.  He reports he had been off all medications from late 2015 until establishing with a PCP in mid 2023.  Since then, he has been adherent to his medications.  He denies any symptoms of hematochezia or melena.  He continues to smoke a little under 1 pack/day and is not interested in fully quitting.  He has reduced his beer intake from 18-24 beers per day to approximately 6 beers 1 night per week.  He reports he has not used cocaine since 2015.  He is in the office today with one of his friends, who drove.   Labs independently reviewed: 10/2016 - Hgb 14.3, PLT 387, potassium 4.0, BUN 13, serum creatinine 0.9, urine drug screen positive for cocaine 06/2014 - TC 140, TG 176, HDL 45, LDL 60, A1c 5.6, albumin 3.7, AST 257, ALT normal  Past Medical History:  Diagnosis Date   CAD in native artery    a. 06/28/2014: STEMI, cath LM nl, mLAD 50%, mLCx 20%, OM1 small in size 80%, ramus 100% s/p PCI/DES 0%, EF 40%    Cocaine abuse (HCC)    Depression    H/O gastric ulcer    Hyperglycemia  a. A1C 5.6%   Hypertension    Ischemic cardiomyopathy    a. EF 40% by cath 06/28/2014   MI (myocardial infarction) ([place])    Polysubstance abuse ([place])    a. cocaine, tobacco, and alcohol    Tobacco abuse     Past Surgical History:  Procedure Laterality Date   CARDIAC CATHETERIZATION  06/28/2014   x1 stent   NISSEN FUNDOPLICATION     stomach ulcer surgery      Current Medications: No outpatient medications have been marked as taking for the 08/12/22 encounter (Office Visit) with Rise Mu, PA-C.    Allergies:   Oxycodone-acetaminophen, Percocet [oxycodone-acetaminophen], and Codeine   Social History    Socioeconomic History   Marital status: Single    Spouse name: Not on file   Number of children: Not on file   Years of education: Not on file   Highest education level: Not on file  Occupational History   Not on file  Tobacco Use   Smoking status: Every Day    Packs/day: 1.00    Years: 38.00    Total pack years: 38.00    Types: Cigarettes   Smokeless tobacco: Never  Vaping Use   Vaping Use: Never used  Substance and Sexual Activity   Alcohol use: Yes    Comment: every weekend 6-12 beer   Drug use: No    Types: Cocaine, Marijuana    Comment: none in at least 2 years   Sexual activity: Not on file  Other Topics Concern   Not on file  Social History Narrative   Not on file   Social Determinants of Health   Financial Resource Strain: Not on file  Food Insecurity: Not on file  Transportation Needs: Not on file  Physical Activity: Not on file  Stress: Not on file  Social Connections: Not on file     Family History:  The patient's family history includes Heart attack in his brother; Heart failure in his father and mother; Prostate cancer in his brother; Stroke in his father.  ROS:   12-point review of systems is negative unless otherwise noted in HPI.   EKGs/Labs/Other Studies Reviewed:    Studies reviewed were summarized above. The additional studies were reviewed today:  Lexiscan MPI 02/13/2022: Pharmacological myocardial perfusion imaging study with no significant  ischemia Large region fixed defect lateral wall consistent with prior MI Normal wall motion, EF estimated at 52% No EKG changes concerning for ischemia at peak stress or in recovery. Moderate risk scan given size of prior MI __________  [facility] 06/28/2014: Mid LAD 50% stenosis, mid LCx 20% stenosis, OM1 80% stenosis, ramus 100% stenosis status post PCI/DES.  EF 40%.   EKG:  EKG is ordered today.  The EKG ordered today demonstrates NSR, 71 bpm, RBBB with possible prior septal and lateral infarct,  consistent with prior tracing  Recent Labs: 08/12/2022: Hemoglobin 13.9; Platelets 316  Recent Lipid Panel    Component Value Date/Time   CHOL 140 06/29/2014 0353   TRIG 176 06/29/2014 0353   HDL 45 06/29/2014 0353   VLDL 35 06/29/2014 0353   LDLCALC 60 06/29/2014 0353    PHYSICAL EXAM:    VS:  BP 120/78 (BP Location: Left Arm, Patient Position: Sitting, Cuff Size: Normal)   Pulse 71   Ht '5\' 3"'$  (1.6 m)   Wt 167 lb (75.8 kg)   SpO2 98%   BMI 29.58 kg/m   BMI: Body mass index is 29.58 kg/m.  Physical  Exam Vitals reviewed.  Constitutional:      Appearance: He is well-developed.  HENT:     Head: Normocephalic and atraumatic.  Eyes:     General:        Right eye: No discharge.        Left eye: No discharge.  Neck:     Vascular: No JVD.  Cardiovascular:     Rate and Rhythm: Normal rate and regular rhythm.     Pulses:          Posterior tibial pulses are 2+ on the right side and 2+ on the left side.     Heart sounds: Normal heart sounds, S1 normal and S2 normal. Heart sounds not distant. No midsystolic click and no opening snap. No murmur heard.    No friction rub.  Pulmonary:     Effort: Pulmonary effort is normal. No respiratory distress.     Breath sounds: Normal breath sounds. No decreased breath sounds, wheezing or rales.  Chest:     Chest wall: No tenderness.  Abdominal:     General: There is no distension.  Musculoskeletal:     Cervical back: Normal range of motion.     Right lower leg: No edema.     Left lower leg: No edema.  Skin:    General: Skin is warm and dry.     Nails: There is no clubbing.  Neurological:     Mental Status: He is alert and oriented to person, place, and time.  Psychiatric:        Speech: Speech normal.        Behavior: Behavior normal.        Thought Content: Thought content normal.        Judgment: Judgment normal.     Wt Readings from Last 3 Encounters:  08/12/22 167 lb (75.8 kg)  08/12/22 163 lb 5.4 oz (74.1 kg)   07/28/22 172 lb 9.6 oz (78.3 kg)     ASSESSMENT & PLAN:   CAD involving the native coronary arteries with unstable angina: Currently without symptoms of angina or cardiac decompensation.  Symptoms feel similar to his prior angina leading up to his MI.  Patient has numerous risk factors for progression of ischemic heart disease including ongoing tobacco use, long history of medication nonadherence, and uncertain blood glucose/cholesterol levels.  Given concerning symptoms we will proceed with diagnostic LHC with further recommendations pending results.  Continue aggressive risk factor modification and secondary prevention including aspirin, metoprolol, olmesartan, and rosuvastatin.  Obtain precath labs to ensure stable electrolytes and renal function prior to IV contrast.  Syncope: Concerning for arrhythmogenic event given sudden onset while walking without recent positional change.  No associated pro or post drome.  Ischemic evaluation as outlined above.  Obtain echo and carotid artery ultrasound.  Place ZIO AT.  No driving for 6 months from date of last syncopal episode (last week) or until an identifiable cause has been adequately treated.  HFrEF secondary to ICM: Appears euvolemic and well compensated.  EF noted to be improved at 52% during [place] MPI in 01/2022.  Currently on metoprolol tartrate and olmesartan.  Not requiring a standing loop diuretic.  Obtain echo as outlined above with recommendation to escalate GDMT as indicated.  HTN: Blood pressure is well-controlled in the office today.  He remains on Toprol-XL and Benicar HCT.  Followed by PCP.  HLD: No recent lipid panel on file for review with goal LDL being at least less than 70.  Currently  on atorvastatin 40 mg daily.  Obtain CMP, lipid panel, and direct LDL.  Polysubstance use: Continues to smoke a little less than 1 pack/day.  Not interested in quitting.  Has decreased beer intake from 18 to 24/day to approximately 6 beers 1 night  per week.  Reports he has not used cocaine since 2015.   Shared Decision Making/Informed Consent{  The risks [stroke (1 in 1000), death (1 in 1000), kidney failure [usually temporary] (1 in 500), bleeding (1 in 200), allergic reaction [possibly serious] (1 in 200)], benefits (diagnostic support and management of coronary artery disease) and alternatives of a cardiac catheterization were discussed in detail with Mr. Esper and he is willing to proceed.     Disposition: F/u with Dr. Rockey Situ or an APP in 1 month.   Medication Adjustments/Labs and Tests Ordered: Current medicines are reviewed at length with the patient today.  Concerns regarding medicines are outlined above. Medication changes, Labs and Tests ordered today are summarized above and listed in the Patient Instructions accessible in Encounters.   Signed, Christell Faith, PA-C 08/12/2022 3:57 PM     [address] [place], [place] [address] [phone]

## 2022-08-12 NOTE — Anesthesia Postprocedure Evaluation (Signed)
Anesthesia Post Note  Patient: Marcus [name]  Procedure(s) Performed: COLONOSCOPY ESOPHAGOGASTRODUODENOSCOPY (EGD)  Patient location during evaluation: PACU Anesthesia Type: General Level of consciousness: awake and alert, oriented and patient cooperative Pain management: pain level controlled Vital Signs Assessment: post-procedure vital signs reviewed and stable Respiratory status: spontaneous breathing, nonlabored ventilation and respiratory function stable Cardiovascular status: blood pressure returned to baseline and stable Postop Assessment: adequate PO intake Anesthetic complications: no   No notable events documented.   Last Vitals:  Vitals:   08/12/22 1125 08/12/22 1135  BP: 116/84 (!) 126/91  Pulse: 80 77  Resp: 16 17  Temp:    SpO2: 100% 99%    Last Pain:  Vitals:   08/12/22 1135  TempSrc:   PainSc: 0-No pain                 Darrin Nipper

## 2022-08-13 ENCOUNTER — Encounter: Payer: Self-pay | Admitting: Internal Medicine

## 2022-08-13 LAB — SURGICAL PATHOLOGY

## 2022-08-17 ENCOUNTER — Encounter: Payer: Self-pay | Admitting: Physician Assistant

## 2022-08-17 ENCOUNTER — Ambulatory Visit: Payer: Medicaid Other | Attending: Physician Assistant

## 2022-08-17 ENCOUNTER — Ambulatory Visit
Admission: RE | Admit: 2022-08-17 | Discharge: 2022-08-17 | Disposition: A | Discharge status: Home / Self Care | Payer: Medicaid Other | Source: Ambulatory Visit | Attending: Cardiovascular Disease | Admitting: Cardiovascular Disease

## 2022-08-17 ENCOUNTER — Telehealth: Payer: Self-pay

## 2022-08-17 ENCOUNTER — Ambulatory Visit (INDEPENDENT_AMBULATORY_CARE_PROVIDER_SITE_OTHER): Payer: Medicaid Other

## 2022-08-17 DIAGNOSIS — I5020 Unspecified systolic (congestive) heart failure: Secondary | ICD-10-CM | POA: Diagnosis not present

## 2022-08-17 DIAGNOSIS — I6523 Occlusion and stenosis of bilateral carotid arteries: Secondary | ICD-10-CM | POA: Insufficient documentation

## 2022-08-17 DIAGNOSIS — R55 Syncope and collapse: Secondary | ICD-10-CM | POA: Diagnosis not present

## 2022-08-17 LAB — ECHOCARDIOGRAM COMPLETE
AR max vel: 2.38 cm2
AV Area VTI: 2.59 cm2
AV Area mean vel: 2.52 cm2
AV Mean grad: 3 mmHg
AV Peak grad: 6.9 mmHg
Ao pk vel: 1.31 m/s
Area-P 1/2: 3.87 cm2
Calc EF: 54.6 %
S' Lateral: 3 cm
Single Plane A2C EF: 52.8 %
Single Plane A4C EF: 55.4 %

## 2022-08-17 MED ORDER — IOHEXOL 350 MG/ML SOLN
75.0000 mL | Freq: Once | INTRAVENOUS | Status: AC | PRN
  Administered 2022-08-17: 75 mL via INTRAVENOUS

## 2022-08-17 NOTE — Addendum Note (Signed)
Addended by: Raelene Bott, Wrenn Willcox L on: 08/17/2022 11:30 AM   Modules accepted: Orders

## 2022-08-17 NOTE — Progress Notes (Signed)
Carotid artery ultrasound showed 60 to 79% stenosis in the right internal carotid artery with 80 to 99% stenosis in the left internal carotid artery as well as left subclavian artery stenosis and abnormal flow in the right vertebral artery.  Stat CTA head and neck is currently pending.  He has also been referred to vascular surgery as an urgent referral.  Given these findings, we will postpone cardiac cath, previously scheduled for 08/18/2022.  We will await vascular surgery recommendations regarding potential repair of his carotid artery stenosis as this may dictate our plan for preoperative cardiac risk stratification.  Case has been discussed with Dr. Rockey Situ.

## 2022-08-17 NOTE — Telephone Encounter (Signed)
Patient had a carotid U/S performed in our office today. Per Dr. Rockey Situ, order to be placed for a STAT Angio of the Neck with and without contrast. Patient informed. Colletta Maryland is able to schedule this testing today. Dr. Rockey Situ is also requesting an Urgent referral to [facility] to be scheduled. Referral placed.

## 2022-08-17 NOTE — Progress Notes (Signed)
CTA head neck from today: IMPRESSION: 1. Severe focal stenosis (approximately 80%) at the proximal right ICA. 2. Approximately 50% stenosis of the proximal left ICA. 3. The left vertebral artery is diffusely small in caliber with possible severe focal stenosis at the origin of the left vertebral artery.  Patient will be evaluated by vascular surgery 08/18/2022. Will await vascular surgery recommendations for further risk stratification. Appreciate their prompt assistance.

## 2022-08-17 NOTE — Telephone Encounter (Signed)
Spoke with patient and advised him that his cardiac cath with Dr. Saunders Revel has been cancelled for tomorrow due to findings on his echo/CT. Advised patient that Dr. Bunnie Domino office will be contacting him for an appointment today or tomorrow. Patient expressed verbal understanding.

## 2022-08-18 ENCOUNTER — Encounter (INDEPENDENT_AMBULATORY_CARE_PROVIDER_SITE_OTHER): Payer: Self-pay | Admitting: Vascular Surgery

## 2022-08-18 ENCOUNTER — Ambulatory Visit (INDEPENDENT_AMBULATORY_CARE_PROVIDER_SITE_OTHER): Payer: Medicaid Other | Admitting: Vascular Surgery

## 2022-08-18 ENCOUNTER — Ambulatory Visit: Admission: RE | Admit: 2022-08-18 | Payer: Medicaid Other | Source: Home / Self Care | Admitting: Internal Medicine

## 2022-08-18 ENCOUNTER — Encounter: Admission: RE | Payer: Self-pay | Source: Home / Self Care

## 2022-08-18 ENCOUNTER — Other Ambulatory Visit (INDEPENDENT_AMBULATORY_CARE_PROVIDER_SITE_OTHER): Payer: Self-pay | Admitting: Vascular Surgery

## 2022-08-18 VITALS — BP 116/62 | HR 66 | Ht 63.0 in | Wt 167.0 lb

## 2022-08-18 DIAGNOSIS — I6521 Occlusion and stenosis of right carotid artery: Secondary | ICD-10-CM

## 2022-08-18 DIAGNOSIS — R739 Hyperglycemia, unspecified: Secondary | ICD-10-CM

## 2022-08-18 DIAGNOSIS — I6529 Occlusion and stenosis of unspecified carotid artery: Secondary | ICD-10-CM | POA: Insufficient documentation

## 2022-08-18 DIAGNOSIS — I200 Unstable angina: Secondary | ICD-10-CM

## 2022-08-18 DIAGNOSIS — I2510 Atherosclerotic heart disease of native coronary artery without angina pectoris: Secondary | ICD-10-CM | POA: Diagnosis not present

## 2022-08-18 DIAGNOSIS — I10 Essential (primary) hypertension: Secondary | ICD-10-CM

## 2022-08-18 DIAGNOSIS — F1910 Other psychoactive substance abuse, uncomplicated: Secondary | ICD-10-CM

## 2022-08-18 SURGERY — LEFT HEART CATH AND CORONARY ANGIOGRAPHY
Anesthesia: Moderate Sedation | Laterality: Left

## 2022-08-18 MED ORDER — CLOPIDOGREL BISULFATE 75 MG PO TABS: 75.0000 mg | ORAL_TABLET | Freq: Every day | ORAL | 6 refills | Status: DC

## 2022-08-18 NOTE — Assessment & Plan Note (Addendum)
I have independently reviewed his CT angiogram formed for carotid disease.  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  Left carotid stenosis is estimated to be in the 50% range which seems reasonable.  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 I had a long discussion with the patient today regarding treatment options.  He is on aspirin and a statin agent.  I am going to add Plavix to his regimen as well.  I discussed the differences between carotid endarterectomy and carotid stenting.  I discussed that at his high degree of stenosis, intervention is recommended for stroke risk reduction.  Discussed the differences between the 2 procedures and the pros and cons of the 2 procedures.  The patient voices understanding and desires to proceed with carotid stenting which seems reasonable.  The lesion is fairly high although maybe not prohibitively high for surgery, it may be difficult to get superior to the lesion surgically based off of his CT scan.  We will go ahead and prescribe Plavix and we will get him on the schedule for carotid stent placement next week.  I will defer further cardiac workup to his cardiologist.

## 2022-08-18 NOTE — Assessment & Plan Note (Signed)
blood pressure control important in reducing the progression of atherosclerotic disease. On appropriate oral medications.  

## 2022-08-18 NOTE — Assessment & Plan Note (Signed)
Severe disease.  Follows with cardiology. May be high risk for general anesthesia.

## 2022-08-18 NOTE — Progress Notes (Signed)
  Patient ID: Marcus [name], male   DOB: 03/27/1966, 57 y.o.   MRN: [id number]  Chief Complaint  Patient presents with   New Patient (Initial Visit)    per secure chat JD - recent episodes of syncope, carotid ultrasound and neck CTA confirming at least 80% stenosis proximal right ICA, also vertebral artery disease.    HPI Marcus [name] is a 56 y.o. male.  I am asked to see the patient by Dr. Gollan for evaluation of carotid stenosis in the setting of syncopal episodes.  The patient was undergoing a cardiac workup for syncopal episodes.  He has a long history of severe cardiac disease with multiple previous interventions.  He was scheduled for what sounds like a Holter monitor and a cardiac catheterization carotid duplex revealed high-grade right carotid stenosis followed by CT angiogram. I have independently reviewed his CT angiogram formed for carotid disease.  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  Left carotid stenosis is estimated to be in the 50% range which seems reasonable.  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  Given this finding, he is referred for further evaluation and treatment.  He is on aspirin and statin agent.     Past Medical History:  Diagnosis Date   CAD in native artery    a. 06/28/2014: STEMI, cath LM nl, mLAD 50%, mLCx 20%, OM1 small in size 80%, ramus 100% s/p PCI/DES 0%, EF 40%    Cocaine abuse (HCC)    Depression    H/O gastric ulcer    Hyperglycemia    a. A1C 5.6%   Hypertension    Ischemic cardiomyopathy    a. EF 40% by cath 06/28/2014   MI (myocardial infarction) (HCC)    Polysubstance abuse (HCC)    a. cocaine, tobacco, and alcohol    Tobacco abuse     Past Surgical History:  Procedure Laterality Date   CARDIAC CATHETERIZATION   06/28/2014   x1 stent   COLONOSCOPY N/A 08/12/2022   Procedure: COLONOSCOPY;  Surgeon: Toledo, Teodoro K, MD;  Location: [facility] ENDOSCOPY;  Service: Gastroenterology;  Laterality: N/A;   ESOPHAGOGASTRODUODENOSCOPY N/A 08/12/2022   Procedure: ESOPHAGOGASTRODUODENOSCOPY (EGD);  Surgeon: Toledo, Teodoro K, MD;  Location: [facility] ENDOSCOPY;  Service: Gastroenterology;  Laterality: N/A;   NISSEN FUNDOPLICATION     stomach ulcer surgery       Family History  Problem Relation Age of Onset   Heart failure Mother    Stroke Father    Heart failure Father    Prostate cancer Brother    Heart attack Brother       Social History   Tobacco Use   Smoking status: Every Day    Packs/day: 1.00    Years: 38.00    Total pack years: 38.00    Types: Cigarettes    Start date: 07/21/1975   Smokeless tobacco: Never  Vaping Use   Vaping Use: Never used  Substance Use Topics   Alcohol use: Yes    Comment: every weekend 6-12 beer   Drug use: No    Types: Cocaine, Marijuana    Comment: none in at least 2 years     Allergies  Allergen Reactions   Oxycodone-Acetaminophen Other (See Comments)   Codeine Rash    Current Outpatient Medications    Medication Sig Dispense Refill   aspirin 81 MG tablet Take 81 mg by mouth daily.     DULoxetine (CYMBALTA) 30 MG capsule Take 30 mg by mouth daily.     fluticasone-salmeterol (ADVAIR DISKUS) 250-50 MCG/ACT AEPB Inhale 1 puff into the lungs in the morning and at bedtime. 60 each 5   hydrOXYzine (VISTARIL) 25 MG capsule Take 25 mg by mouth 2 (two) times daily.     metoprolol succinate (TOPROL-XL) 50 MG 24 hr tablet Take 1 tablet (50 mg total) by mouth daily. Take with or immediately following a meal. 90 tablet 3   nitroGLYCERIN (NITROSTAT) 0.4 MG SL tablet SMARTSIG:1 Sublingual Daily     olmesartan-hydrochlorothiazide (BENICAR HCT) 40-12.5 MG tablet Take 1 tablet by mouth daily.     pantoprazole (PROTONIX) 40 MG tablet Take 40 mg by mouth daily.     QUEtiapine  (SEROQUEL XR) 50 MG TB24 24 hr tablet Take 50 mg by mouth daily. Take 2 tablets by mouth daily at bedtime     rosuvastatin (CRESTOR) 40 MG tablet Take 40 mg by mouth daily.     VENTOLIN HFA 108 (90 Base) MCG/ACT inhaler SMARTSIG:2 Puff(s) By Mouth Every 4 Hours PRN     No current facility-administered medications for this visit.      REVIEW OF SYSTEMS (Negative unless checked)  Constitutional: []Weight loss  []Fever  []Chills Cardiac: []Chest pain   []Chest pressure   []Palpitations   []Shortness of breath when laying flat   []Shortness of breath at rest   [x]Shortness of breath with exertion. Vascular:  []Pain in legs with walking   []Pain in legs at rest   []Pain in legs when laying flat   []Claudication   []Pain in feet when walking  []Pain in feet at rest  []Pain in feet when laying flat   []History of DVT   []Phlebitis   []Swelling in legs   []Varicose veins   []Non-healing ulcers Pulmonary:   []Uses home oxygen   []Productive cough   []Hemoptysis   []Wheeze  []COPD   []Asthma Neurologic:  []Dizziness  [x]Blackouts   []Seizures   []History of stroke   []History of TIA  []Aphasia   []Temporary blindness   []Dysphagia   []Weakness or numbness in arms   []Weakness or numbness in legs Musculoskeletal:  [x]Arthritis   []Joint swelling   []Joint pain   []Low back pain Hematologic:  []Easy bruising  []Easy bleeding   []Hypercoagulable state   []Anemic  []Hepatitis Gastrointestinal:  []Blood in stool   []Vomiting blood  []Gastroesophageal reflux/heartburn   []Abdominal pain Genitourinary:  []Chronic kidney disease   []Difficult urination  []Frequent urination  []Burning with urination   []Hematuria Skin:  []Rashes   []Ulcers   []Wounds Psychological:  []History of anxiety   [] History of major depression.    Physical Exam BP 116/62   Pulse 66   Ht 5' 3" (1.6 m)   Wt 167 lb (75.8 kg)   BMI 29.58 kg/m  Gen:  WD/WN, NAD. Appears older than stated age. Head: [place]/AT, No temporalis  wasting. Ear/Nose/Throat: Hearing grossly intact, nares w/o erythema or drainage, oropharynx w/o Erythema/Exudate Eyes: Conjunctiva clear, sclera non-icteric  Neck: trachea midline.  No JVD.  Pulmonary:  Good air movement, respirations not labored, no use of accessory muscles  Cardiac: RRR, no JVD Vascular:  Vessel Right Left  Radial Palpable Palpable                                     Gastrointestinal:. No masses, surgical incisions, or scars. Musculoskeletal: M/S 5/5 throughout.  Extremities without ischemic changes.  No deformity or atrophy. No edema. Neurologic: Sensation grossly intact in extremities.  Symmetrical.  Speech is fluent. Motor exam as listed above. Psychiatric: Judgment intact, Mood & affect appropriate for pt's clinical situation. Dermatologic: No rashes or ulcers noted.  No cellulitis or open wounds.    Radiology ECHOCARDIOGRAM COMPLETE  Result Date: 08/17/2022    ECHOCARDIOGRAM REPORT   Patient Name:   Edenilson D Monda Date of Exam: 08/17/2022 Medical Rec #:  [id number]        Height:       63.0 in Accession #:    [id number]       Weight:       167.0 lb Date of Birth:  05/14/1966        BSA:          1.791 m Patient Age:    56 years         BP:           152/86 mmHg Patient Gender: M                HR:           62 bpm. Exam Location:  [place] Procedure: 2D Echo, Cardiac Doppler, Color Doppler and Strain Analysis Indications:    R55 Syncope; I50.20* Unspecified systolic (congestive) heart                 failure  History:        Patient has no prior history of Echocardiogram examinations.                 CHF, CAD, Signs/Symptoms:Syncope; Risk Factors:Hypertension and                 Current Smoker.  Sonographer:    Gary Joseph RDMS, RVT, RDCS Referring Phys: [id number] RYAN M DUNN IMPRESSIONS  1. Left ventricular ejection fraction, by estimation, is 55 to 60%. The left ventricle has normal function. The left ventricle has no regional wall motion abnormalities. Left  ventricular diastolic parameters are consistent with Grade II diastolic dysfunction (pseudonormalization). The average left ventricular global longitudinal strain is -19.5 %. The global longitudinal strain is normal.  2. Right ventricular systolic function is normal. The right ventricular size is normal.  3. The mitral valve is normal in structure. Mild mitral valve regurgitation.  4. The aortic valve is tricuspid. Aortic valve regurgitation is not visualized. FINDINGS  Left Ventricle: Left ventricular ejection fraction, by estimation, is 55 to 60%. The left ventricle has normal function. The left ventricle has no regional wall motion abnormalities. The average left ventricular global longitudinal strain is -19.5 %. The global longitudinal strain is normal. The left ventricular internal cavity size was normal in size. There is no left ventricular hypertrophy. Left ventricular diastolic parameters are consistent with Grade II diastolic dysfunction (pseudonormalization). Right Ventricle: The right ventricular size is normal. No increase in right ventricular wall thickness. Right ventricular systolic function is normal. Left Atrium: Left atrial size was normal in size. Right Atrium: Right atrial size was normal in size. Pericardium: There is no evidence of pericardial effusion. Mitral Valve: The mitral valve is normal in structure. Mild mitral valve regurgitation. Tricuspid Valve: The tricuspid valve is normal in structure. Tricuspid valve regurgitation is trivial. Aortic Valve: The aortic valve is tricuspid. Aortic valve regurgitation is not visualized. Aortic valve mean gradient measures 3.0 mmHg. Aortic valve peak gradient measures   6.9 mmHg. Aortic valve area, by VTI measures 2.59 cm. Pulmonic Valve: The pulmonic valve was normal in structure. Pulmonic valve regurgitation is not visualized. Aorta: The aortic root and ascending aorta are structurally normal, with no evidence of dilitation. Venous: The inferior vena  cava was not well visualized. IAS/Shunts: No atrial level shunt detected by color flow Doppler.  LEFT VENTRICLE PLAX 2D LVIDd:         4.90 cm     Diastology LVIDs:         3.00 cm     LV e' medial:    6.09 cm/s LV PW:         0.70 cm     LV E/e' medial:  20.9 LV IVS:        0.80 cm     LV e' lateral:   7.07 cm/s LVOT diam:     2.00 cm     LV E/e' lateral: 18.0 LV SV:         71 LV SV Index:   39          2D Longitudinal Strain LVOT Area:     3.14 cm    2D Strain GLS Avg:     -19.5 %  LV Volumes (MOD) LV vol d, MOD A2C: 83.6 ml LV vol d, MOD A4C: 93.8 ml LV vol s, MOD A2C: 39.5 ml LV vol s, MOD A4C: 41.8 ml LV SV MOD A2C:     44.1 ml LV SV MOD A4C:     93.8 ml LV SV MOD BP:      49.0 ml RIGHT VENTRICLE RV Basal diam:  2.90 cm RV S prime:     14.50 cm/s TAPSE (M-mode): 2.8 cm LEFT ATRIUM             Index        RIGHT ATRIUM           Index LA diam:        4.00 cm 2.23 cm/m   RA Area:     10.60 cm LA Vol (A2C):   35.3 ml 19.71 ml/m  RA Volume:   23.20 ml  12.95 ml/m LA Vol (A4C):   39.7 ml 22.17 ml/m LA Biplane Vol: 37.5 ml 20.94 ml/m  AORTIC VALVE                    PULMONIC VALVE AV Area (Vmax):    2.38 cm     PV Vmax:       1.15 m/s AV Area (Vmean):   2.52 cm     PV Peak grad:  5.3 mmHg AV Area (VTI):     2.59 cm AV Vmax:           131.00 cm/s AV Vmean:          84.900 cm/s AV VTI:            0.273 m AV Peak Grad:      6.9 mmHg AV Mean Grad:      3.0 mmHg LVOT Vmax:         99.20 cm/s LVOT Vmean:        68.100 cm/s LVOT VTI:          0.225 m LVOT/AV VTI ratio: 0.82  AORTA Ao Root diam: 3.00 cm Ao Asc diam:  2.60 cm MITRAL VALVE MV Area (PHT): 3.87 cm     SHUNTS MV Decel Time: 196 msec     Systemic VTI:    0.22 m MV E velocity: 127.00 cm/s  Systemic Diam: 2.00 cm MV A velocity: 118.00 cm/s MV E/A ratio:  1.08 Brian Agbor-Etang MD Electronically signed by Brian Agbor-Etang MD Signature Date/Time: 08/17/2022/4:39:12 PM    Final    CT ANGIO NECK W OR WO CONTRAST  Result Date: 08/17/2022 CLINICAL DATA:   Blacking out EXAM: CT ANGIOGRAPHY NECK TECHNIQUE: Multidetector CT imaging of the neck was performed using the standard protocol during bolus administration of intravenous contrast. Multiplanar CT image reconstructions and MIPs were obtained to evaluate the vascular anatomy. Carotid stenosis measurements (when applicable) are obtained utilizing NASCET criteria, using the distal internal carotid diameter as the denominator. RADIATION DOSE REDUCTION: This exam was performed according to the departmental dose-optimization program which includes automated exposure control, adjustment of the mA and/or kV according to patient size and/or use of iterative reconstruction technique. CONTRAST:  75mL OMNIPAQUE IOHEXOL 350 MG/ML SOLN COMPARISON:  None Available. FINDINGS: Aortic arch: Standard branching. Imaged portion shows no evidence of aneurysm or dissection. No significant stenosis of the major arch vessel origins. Mild narrowing of the origin of the left subclavian artery. Right carotid system: Severe focal stenosis (approximately 80% in the proximal right ICA (series 6, image 170) Left carotid system: Approximately 50% stenosis of the proximal left ICA (series 6, image 160). Vertebral arteries: The left vertebral artery is diffusely small in caliber. There may be severe focal stenosis of the origin of the left vertebral artery (series 6, image 76). Moderate focal stenosis of the mid V2 segment of the right vertebral artery (series 6, image 130). The V1 segment of the right vertebral artery is poorly assessed due to streak artifact from venous contrast material. Skeleton: Negative straightening of normal cervical lordosis. Other neck: Mild soft tissue stranding over the midline suboccipital neck (series 5, image 98). Mucosal thickening bilateral ethmoid and left maxillary sinus. Carious dentition multiple periapical lucencies. Upper chest: Biapical bronchial wall thickening, nonspecific, but be seen in the setting of  bronchitis. IMPRESSION: 1. Severe focal stenosis (approximately 80%) at the proximal right ICA. 2. Approximately 50% stenosis of the proximal left ICA. 3. The left vertebral artery is diffusely small in caliber with possible severe focal stenosis at the origin of the left vertebral artery. Electronically Signed   By: Hemant  Desai M.D.   On: 08/17/2022 12:40   VAS US CAROTID  Result Date: 08/17/2022 Carotid Arterial Duplex Study Patient Name:  Kahlel D Naugle  Date of Exam:   08/17/2022 Medical Rec #: [id number]         Accession #:    [id number] Date of Birth: 08/19/1965         Patient Gender: M Patient Age:   56 years Exam Location:  [place] Procedure:      VAS US CAROTID Referring Phys: RYAN DUNN --------------------------------------------------------------------------------  Indications:                            Syncope and Multiple syncopal episodes                                         over the last several weeks. The last                                           one was 3 weeks ago.. Denies all other                                         cerebrovascular symptoms. Risk Factors:                           Hypertension, current smoker, coronary                                         artery disease. Comparison Study:                       No previous on record Pre-Surgical Evaluation & Surgical      Stenosis at RICA only. ICA is normal Correlation                             past the stenosis. Anatomy on the right                                         is within normal limits.Right                                         bifurcation is located near the Hyoid                                         Notch. Stenosis is >2 cm distal to the                                         LICA origin.ICA is not normal past                                         stenosis. Anatomy on the left is within                                         normal limits.Left bifurcation is                                          located near the Hyoid Notch. Performing Technologist: Gary Joseph RDMS, RVT, RDCS  Examination Guidelines: A complete evaluation includes B-mode imaging, spectral Doppler, color Doppler, and power Doppler as needed of all accessible portions of each vessel. Bilateral testing is considered an integral part of a complete examination. Limited examinations for reoccurring indications may be performed as noted.  Right Carotid Findings: +----------+--------+--------+--------+---------------------+------------------+           PSV cm/sEDV cm/sStenosisPlaque Description   Comments           +----------+--------+--------+--------+---------------------+------------------+   CCA Prox  88      15                                                      +----------+--------+--------+--------+---------------------+------------------+ CCA Distal56      14      <50%    smooth, homogeneous                                                       and diffuse                             +----------+--------+--------+--------+---------------------+------------------+ ICA Prox  224     74      60-79%  homogeneous and      Scant flow through                                   smooth               the stenosis       +----------+--------+--------+--------+---------------------+------------------+ ICA Mid   86      27      1-39%   homogeneous and                                                           smooth                                  +----------+--------+--------+--------+---------------------+------------------+ ICA Distal130     29                                                      +----------+--------+--------+--------+---------------------+------------------+ ECA       386     51      >50%    smooth and                                                                homogeneous                              +----------+--------+--------+--------+---------------------+------------------+ +----------+--------+-------+----------------+-------------------+           PSV cm/sEDV cmsDescribe        Arm Pressure (mmHG) +----------+--------+-------+----------------+-------------------+ Subclavian132            Multiphasic, WNL152                 +----------+--------+-------+----------------+-------------------+ +---------+--------+--------+--------------+   VertebralPSV cm/sEDV cm/sNot identified +---------+--------+--------+--------------+ Pre-stenosis 187/74 cm/s Post-stenosis 86/27 cm/s Left Carotid Findings: +----------+--------+--------+--------+---------------------+------------------+           PSV cm/sEDV cm/sStenosisPlaque Description   Comments           +----------+--------+--------+--------+---------------------+------------------+ CCA Prox  159     29                                                      +----------+--------+--------+--------+---------------------+------------------+ CCA Distal80      20      <50%    smooth and                                                                heterogenous                            +----------+--------+--------+--------+---------------------+------------------+ ICA Prox  389     120     80-99%  diffuse, homogeneous                                                      and smooth                              +----------+--------+--------+--------+---------------------+------------------+ ICA Mid   279     60      40-59%  smooth and           high end of 40-59%                                   homogeneous                             +----------+--------+--------+--------+---------------------+------------------+ ICA Distal174     27                                                      +----------+--------+--------+--------+---------------------+------------------+ ECA       270     51       >50%    smooth and                                                                homogeneous                             +----------+--------+--------+--------+---------------------+------------------+ +----------+--------+--------+--------+-------------------+             PSV cm/sEDV cm/sDescribeArm Pressure (mmHG) +----------+--------+--------+--------+-------------------+ Subclavian300             Stenotic152                 +----------+--------+--------+--------+-------------------+ +---------+--------+--+--------+-+---------------------------------+ VertebralPSV cm/s45EDV cm/s8Scant flow via color flow Doppler +---------+--------+--+--------+-+---------------------------------+ Left BP 152/86 mmHg Pre-stenosis 166/46 cm/s Post-stenosis 283/29 cm/s spectral broadening  Summary: Right Carotid: Velocities in the right ICA are consistent with a 60-79%                stenosis. Non-hemodynamically significant plaque <50% noted in                the CCA. The ECA appears >50% stenosed. Left Carotid: Velocities in the left ICA are consistent with a 80-99% stenosis.               Non-hemodynamically significant plaque <50% noted in the CCA. The               ECA appears >50% stenosed. Vertebrals:  Left vertebral artery demonstrates antegrade flow. Right vertebral              artery was not visualized. Scant flow in left vertebral artery. Subclavians: Left subclavian artery was stenotic. Normal flow hemodynamics were              seen in the right subclavian artery. *See table(s) above for measurements and observations.  Suggest Peripheral Vascular Consult. Electronically signed by Jonathan Branch MD on 08/17/2022 at 11:51:47 AM.    Final     Labs Recent Results (from the past 2160 hour(s))  Surgical pathology     Status: None   Collection Time: 08/12/22  9:36 AM  Result Value Ref Range   SURGICAL PATHOLOGY      SURGICAL PATHOLOGY CASE: [id number] PATIENT: Aydien Coiner Surgical  Pathology Report     Specimen Submitted: A. Stomach, body; cbx B. Rectum polyp; hot snare  Clinical History: Abdominal pain, epigastric R10.13, dyspepsia R10.13, gastroesophageal reflux disease, unspecified whether esophagitis present K21.9, history of partial gastrectomy Z90.3, history of gastric ulcer Z878.11 history of Nissen fundoplication Z98.890, colon cancer screening Z12.11.  Surgical changes pyloric, gastritis, Nissen fundoplication, diverticulosis, rectal polyp      DIAGNOSIS: A.  STOMACH; BIOPSY: - GASTRIC BODY TYPE MUCOSA WITH NO SPECIFIC HISTOLOGIC ABNORMALITY.  B.  RECTUM, POLYP; HOT SNARE BIOPSIES: - TUBULAR ADENOMA - NO EVIDENCE OF HIGH-GRADE DYSPLASIA OR MALIGNANCY.  GROSS DESCRIPTION: A. Labeled: cbx gastric body rule out H. pylori Received: Formalin Collection time: 9:36 AM on 08/12/2022 Placed into formalin time: 9:36 AM on 08/12/2022 Tissue fragment(s): 1 Size : 0.5 x 0.2 x 0.1 cm Description: Tan elongated soft tissue fragment Entirely submitted in 1 cassette.  B. Labeled: Hot snare rectal polyp x 1 Received: Formalin Collection time: 10:41 AM on 08/12/2022 Placed into formalin time: 10:41 AM on 08/12/2022 Tissue fragment(s): 1 Size: 0.7 x 0.5 x 0.5 cm Description: Received is a fragment of tan-pink soft tissue.  The resection margin is inked green and the specimen is bisected. Entirely submitted in 1 cassette.  RB 08/12/2022  Final Diagnosis performed by Anne Geyer, MD.   Electronically signed 08/13/2022 12:21:27PM The electronic signature indicates that the named Attending Pathologist has evaluated the specimen Technical component performed at [facility], [address], [place], [place] [address] Lab: [phone] Dir: Sanjai Nagendra, MD, MMM  Professional component performed at [facility], [facility], [address], [place], [place] [address] Lab: [phone] Dir: Heath M. Jones, MD   Direct   LDL     Status: None    Collection Time: 08/12/22  3:40 PM  Result Value Ref Range   Direct LDL 45 0 - 99 mg/dL    Comment: Performed at [facility], [address] [place], [place] [address]  Lipid Profile     Status: None   Collection Time: 08/12/22  3:40 PM  Result Value Ref Range   Cholesterol 138 0 - 200 mg/dL   Triglycerides 143 <150 mg/dL   HDL 61 >40 mg/dL   Total CHOL/HDL Ratio 2.3 RATIO   VLDL 29 0 - 40 mg/dL   LDL Cholesterol 48 0 - 99 mg/dL    Comment:        Total Cholesterol/HDL:CHD Risk Coronary Heart Disease Risk Table                     Men   Women  1/2 Average Risk   3.4   3.3  Average Risk       5.0   4.4  2 X Average Risk   9.6   7.1  3 X Average Risk  23.4   11.0        Use the calculated Patient Ratio above and the CHD Risk Table to determine the patient's CHD Risk.        ATP III CLASSIFICATION (LDL):  <100     mg/dL   Optimal  100-129  mg/dL   Near or Above                    Optimal  130-159  mg/dL   Borderline  160-189  mg/dL   High  >190     mg/dL   Very High Performed at [facility], [address] [address] [place], [place] [address]   Comp Met (CMET)     Status: Abnormal   Collection Time: 08/12/22  3:40 PM  Result Value Ref Range   Sodium 138 135 - 145 mmol/L   Potassium 4.3 3.5 - 5.1 mmol/L   Chloride 102 98 - 111 mmol/L   CO2 30 22 - 32 mmol/L   Glucose, Bld 84 70 - 99 mg/dL    Comment: Glucose reference range applies only to samples taken after fasting for at least 8 hours.   BUN 23 (H) 6 - 20 mg/dL   Creatinine, Ser 1.36 (H) 0.61 - 1.24 mg/dL   Calcium 9.2 8.9 - 10.3 mg/dL   Total Protein 7.7 6.5 - 8.1 g/dL   Albumin 4.2 3.5 - 5.0 g/dL   AST 31 15 - 41 U/L   ALT 36 0 - 44 U/L   Alkaline Phosphatase 60 38 - 126 U/L   Total Bilirubin 0.6 0.3 - 1.2 mg/dL   GFR, Estimated >60 >60 mL/min    Comment: (NOTE) Calculated using the CKD-EPI Creatinine Equation (2021)    Anion gap 6 5 - 15    Comment: Performed at [facility], [address] [place], [place] [address]  CBC     Status: None   Collection Time: 08/12/22  3:40 PM  Result Value Ref Range   WBC 7.9 4.0 - 10.5 K/uL   RBC 4.57 4.22 - 5.81 MIL/uL   Hemoglobin 13.9 13.0 - 17.0 g/dL   HCT 41.5 39.0 - 52.0 %   MCV 90.8 80.0 - 100.0 fL   MCH 30.4 26.0 - 34.0 pg   MCHC 33.5 30.0 - 36.0 g/dL   RDW 13.3 11.5 -   15.5 %   Platelets 316 150 - 400 K/uL   nRBC 0.0 0.0 - 0.2 %    Comment: Performed at [facility], [address] [place], [place] [address]  ECHOCARDIOGRAM COMPLETE     Status: None   Collection Time: 08/17/22 10:54 AM  Result Value Ref Range   AR max vel 2.38 cm2   AV Peak grad 6.9 mmHg   Ao pk vel 1.31 m/s   S' Lateral 3.00 cm   Area-P 1/2 3.87 cm2   AV Area VTI 2.59 cm2   AV Mean grad 3.0 mmHg   Single Plane A4C EF 55.4 %   Single Plane A2C EF 52.8 %   Calc EF 54.6 %   AV Area mean vel 2.52 cm2   Est EF 55 - 60%     Assessment/Plan:  Carotid stenosis I have independently reviewed his CT angiogram formed for carotid disease.  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  Left carotid stenosis is estimated to be in the 50% range which seems reasonable.  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 I had a long discussion with the patient today regarding treatment options.  He is on aspirin and a statin agent.  I am going to add Plavix to his regimen as well.  I discussed the differences between carotid endarterectomy and carotid stenting.  I discussed that at his high degree of stenosis, intervention is recommended for stroke risk reduction.  Discussed the differences between the 2 procedures and the pros and cons of the 2 procedures.  The patient voices understanding and desires to proceed with carotid stenting which seems reasonable.  The  lesion is fairly high although maybe not prohibitively high for surgery, it may be difficult to get superior to the lesion surgically based off of his CT scan.  We will go ahead and prescribe Plavix and we will get him on the schedule for carotid stent placement next week.  I will defer further cardiac workup to his cardiologist.  CAD in native artery Severe disease.  Follows with cardiology. May be high risk for general anesthesia.  Hypertension blood pressure control important in reducing the progression of atherosclerotic disease. On appropriate oral medications.   Polysubstance abuse Cocaine and tobacco increases risk of recurrent and progressive atherosclerotic vascular disease.      Malania Gawthrop 08/18/2022, 11:48 AM   This note was created with Dragon medical transcription system.  Any errors from dictation are unintentional.   

## 2022-08-18 NOTE — Assessment & Plan Note (Signed)
Cocaine and tobacco increases risk of recurrent and progressive atherosclerotic vascular disease.

## 2022-08-18 NOTE — Patient Instructions (Signed)
Carotid Angioplasty With Stent, Care After The following information offers guidance on how to care for yourself after your procedure. Your doctor may also give you more specific instructions. If you have problems or questions, contact your doctor. What can I expect after the procedure? After the procedure, it is common to have: Bruising at the site where the small, thin tube (catheter) was inserted. This usually goes away within 1-2 weeks. A small amount of blood or clear fluid coming from your cut from surgery (incision). A small amount of blood collecting under your skin (hematoma) around the tube site. This may form a lump that can be sore and tender. This usually lasts for 1-2 weeks. You may have a test that uses sound waves to take pictures (ultrasound) of the carotid artery. This ultrasound may be compared to future ultrasounds to check for changes in the artery. Follow these instructions at home: Medicines Take over-the-counter and prescription medicines only as told by your doctor. Your doctor may prescribe blood thinners after the procedure to prevent blood clots. If you are taking blood thinners: Talk with your doctor before taking any medicines that have aspirin or NSAIDs, such as ibuprofen. Take medicines exactly as told. Take them at the same time each day. Avoid doing things that could hurt or bruise you. Take action to prevent falls. Wear an alert bracelet or carry a card that shows you are taking blood thinners. Incision care  Follow instructions from your doctor about how to take care of your cut from surgery. Make sure you: Wash your hands with soap and water for at least 20 seconds before and after you change your bandage (dressing). If you cannot use soap and water, use hand sanitizer. Change your bandage. Leave stitches or skin glue in place for at least 2 weeks. Leave tape strips alone unless you are told to take them off. You may trim the edges of the tape strips if  they curl up. Do not rub the site. This may cause bleeding. Keep your cut from surgery clean and dry. Check the area around your cut every day for signs of infection. Check for: More redness, swelling, or pain. More fluid or blood. Warmth. Pus or a bad smell. Activity Return to your normal activities when your doctor says that it is safe. You may have to avoid lifting. Ask your doctor how much you can safely lift. Avoid sex until your doctor says that this is safe for you. Eating and drinking Follow instructions from your doctor about what you may eat and drink. Drink enough fluid to keep your pee (urine) pale yellow. General instructions Avoid straining to poop to keep the cut from surgery from bleeding. Do not smoke or use any products that contain nicotine or tobacco. If you need help quitting, ask your doctor. Tell all your doctors that you have a stent. Keep all follow-up visits. This includes ultrasound appointments to check for any changes in the artery. Contact a doctor if: You have any infection signs. You have a lump caused by bleeding under your skin, and the lump does not go away after 2 weeks. You have a fever. Get help right away if: You have a hard time breathing. You have pain in your chest. You have a lump caused by bleeding under your skin, and the lump gets bigger quickly. Your cut from surgery starts to bleed and does not stop after you hold pressure on it for a few minutes. You have pain in the area   where your stent was placed. You have any signs of a stroke. You may be at risk for a stroke even after this procedure. You may be at greater risk of a stroke if you have diabetes, lung disease, kidney disease, had a previous stroke, or are 80 years of age or older. "BE FAST" is an easy way to remember the main warning signs: B - Balance. Signs are dizziness, sudden trouble walking, or loss of balance. E - Eyes. Signs are trouble seeing or a change in how you see. F -  Face. Signs are sudden weakness or loss of feeling of the face, or the face or eyelid drooping on one side. A - Arms. Signs are weakness or loss of feeling in an arm. This happens suddenly and usually on one side of the body. S - Speech. Signs are sudden trouble speaking, slurred speech, or trouble understanding what people say. T - Time. Time to call emergency services. Write down what time symptoms started. You have other signs of a stroke, such as: A sudden, very bad headache with no known cause. Feeling like you may vomit (nausea). Vomiting. A seizure. These symptoms may be an emergency. Get help right away. Call 911. Do not wait to see if the symptoms will go away. Do not drive yourself to the hospital. This information is not intended to replace advice given to you by your health care provider. Make sure you discuss any questions you have with your health care provider. Document Revised: 12/02/2021 Document Reviewed: 12/02/2021 [employer] Patient Education  2023 [employer]  

## 2022-08-18 NOTE — H&P (View-Only) (Signed)
Patient ID: AXTYN BELLIDO, male   DOB: August 16, 1965, 57 y.o.   MRN: [id number]  Chief Complaint  Patient presents with   New Patient (Initial Visit)    per secure chat JD - recent episodes of syncope, carotid ultrasound and neck CTA confirming at least 80% stenosis proximal right ICA, also vertebral artery disease.    HPI Marcus [name] is a 57 y.o. male.  I am asked to see the patient by Dr. Rockey Situ for evaluation of carotid stenosis in the setting of syncopal episodes.  The patient was undergoing a cardiac workup for syncopal episodes.  He has a long history of severe cardiac disease with multiple previous interventions.  He was scheduled for what sounds like a Holter monitor and a cardiac catheterization carotid duplex revealed high-grade right carotid stenosis followed by CT angiogram. I have independently reviewed his CT angiogram formed for carotid disease.  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  Left carotid stenosis is estimated to be in the 50% range which seems reasonable.  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  Given this finding, he is referred for further evaluation and treatment.  He is on aspirin and statin agent.     Past Medical History:  Diagnosis Date   CAD in native artery    a. 06/28/2014: STEMI, cath LM nl, mLAD 50%, mLCx 20%, OM1 small in size 80%, ramus 100% s/p PCI/DES 0%, EF 40%    Cocaine abuse (HCC)    Depression    H/O gastric ulcer    Hyperglycemia    a. A1C 5.6%   Hypertension    Ischemic cardiomyopathy    a. EF 40% by cath 06/28/2014   MI (myocardial infarction) ([place])    Polysubstance abuse ([place])    a. cocaine, tobacco, and alcohol    Tobacco abuse     Past Surgical History:  Procedure Laterality Date   CARDIAC CATHETERIZATION   06/28/2014   x1 stent   COLONOSCOPY N/A 08/12/2022   Procedure: COLONOSCOPY;  Surgeon: Toledo, Benay Pike, MD;  Location: [facility] ENDOSCOPY;  Service: Gastroenterology;  Laterality: N/A;   ESOPHAGOGASTRODUODENOSCOPY N/A 08/12/2022   Procedure: ESOPHAGOGASTRODUODENOSCOPY (EGD);  Surgeon: Toledo, Benay Pike, MD;  Location: [facility] ENDOSCOPY;  Service: Gastroenterology;  Laterality: N/A;   NISSEN FUNDOPLICATION     stomach ulcer surgery       Family History  Problem Relation Age of Onset   Heart failure Mother    Stroke Father    Heart failure Father    Prostate cancer Brother    Heart attack Brother       Social History   Tobacco Use   Smoking status: Every Day    Packs/day: 1.00    Years: 38.00    Total pack years: 38.00    Types: Cigarettes    Start date: 07/21/1975   Smokeless tobacco: Never  Vaping Use   Vaping Use: Never used  Substance Use Topics   Alcohol use: Yes    Comment: every weekend 6-12 beer   Drug use: No    Types: Cocaine, Marijuana    Comment: none in at least 2 years     Allergies  Allergen Reactions   Oxycodone-Acetaminophen Other (See Comments)   Codeine Rash    Current Outpatient Medications  Medication Sig Dispense Refill   aspirin 81 MG tablet Take 81 mg by mouth daily.     DULoxetine (CYMBALTA) 30 MG capsule Take 30 mg by mouth daily.     fluticasone-salmeterol (ADVAIR DISKUS) 250-50 MCG/ACT AEPB Inhale 1 puff into the lungs in the morning and at bedtime. 60 each 5   hydrOXYzine (VISTARIL) 25 MG capsule Take 25 mg by mouth 2 (two) times daily.     metoprolol succinate (TOPROL-XL) 50 MG 24 hr tablet Take 1 tablet (50 mg total) by mouth daily. Take with or immediately following a meal. 90 tablet 3   nitroGLYCERIN (NITROSTAT) 0.4 MG SL tablet SMARTSIG:1 Sublingual Daily     olmesartan-hydrochlorothiazide (BENICAR HCT) 40-12.5 MG tablet Take 1 tablet by mouth daily.     pantoprazole (PROTONIX) 40 MG tablet Take 40 mg by mouth daily.     QUEtiapine  (SEROQUEL XR) 50 MG TB24 24 hr tablet Take 50 mg by mouth daily. Take 2 tablets by mouth daily at bedtime     rosuvastatin (CRESTOR) 40 MG tablet Take 40 mg by mouth daily.     VENTOLIN HFA 108 (90 Base) MCG/ACT inhaler SMARTSIG:2 Puff(s) By Mouth Every 4 Hours PRN     No current facility-administered medications for this visit.      REVIEW OF SYSTEMS (Negative unless checked)  Constitutional: []$ Weight loss  []$ Fever  []$ Chills Cardiac: []$ Chest pain   []$ Chest pressure   []$ Palpitations   []$ Shortness of breath when laying flat   []$ Shortness of breath at rest   [x]$ Shortness of breath with exertion. Vascular:  []$ Pain in legs with walking   []$ Pain in legs at rest   []$ Pain in legs when laying flat   []$ Claudication   []$ Pain in feet when walking  []$ Pain in feet at rest  []$ Pain in feet when laying flat   []$ History of DVT   []$ Phlebitis   []$ Swelling in legs   []$ Varicose veins   []$ Non-healing ulcers Pulmonary:   []$ Uses home oxygen   []$ Productive cough   []$ Hemoptysis   []$ Wheeze  []$ COPD   []$ Asthma Neurologic:  []$ Dizziness  [x]$ Blackouts   []$ Seizures   []$ History of stroke   []$ History of TIA  []$ Aphasia   []$ Temporary blindness   []$ Dysphagia   []$ Weakness or numbness in arms   []$ Weakness or numbness in legs Musculoskeletal:  [x]$ Arthritis   []$ Joint swelling   []$ Joint pain   []$ Low back pain Hematologic:  []$ Easy bruising  []$ Easy bleeding   []$ Hypercoagulable state   []$ Anemic  []$ Hepatitis Gastrointestinal:  []$ Blood in stool   []$ Vomiting blood  []$ Gastroesophageal reflux/heartburn   []$ Abdominal pain Genitourinary:  []$ Chronic kidney disease   []$ Difficult urination  []$ Frequent urination  []$ Burning with urination   []$ Hematuria Skin:  []$ Rashes   []$ Ulcers   []$ Wounds Psychological:  []$ History of anxiety   []$  History of major depression.    Physical Exam BP 116/62   Pulse 66   Ht 5' 3"$  (1.6 m)   Wt 167 lb (75.8 kg)   BMI 29.58 kg/m  Gen:  WD/WN, NAD. Appears older than stated age. Head: [place]/AT, No temporalis  wasting. Ear/Nose/Throat: Hearing grossly intact, nares w/o erythema or drainage, oropharynx w/o Erythema/Exudate Eyes: Conjunctiva clear, sclera non-icteric  Neck: trachea midline.  No JVD.  Pulmonary:  Good air movement, respirations not labored, no use of accessory muscles  Cardiac: RRR, no JVD Vascular:  Vessel Right Left  Radial Palpable Palpable  Gastrointestinal:. No masses, surgical incisions, or scars. Musculoskeletal: M/S 5/5 throughout.  Extremities without ischemic changes.  No deformity or atrophy. No edema. Neurologic: Sensation grossly intact in extremities.  Symmetrical.  Speech is fluent. Motor exam as listed above. Psychiatric: Judgment intact, Mood & affect appropriate for pt's clinical situation. Dermatologic: No rashes or ulcers noted.  No cellulitis or open wounds.    Radiology ECHOCARDIOGRAM COMPLETE  Result Date: 08/17/2022    ECHOCARDIOGRAM REPORT   Patient Name:   Marcus [name] Date of Exam: 08/17/2022 Medical Rec #:  [id number]        Height:       63.0 in Accession #:    [id number]       Weight:       167.0 lb Date of Birth:  Feb 07, 1966        BSA:          1.791 m Patient Age:    22 years         BP:           152/86 mmHg Patient Gender: M                HR:           62 bpm. Exam Location:  [place] Procedure: 2D Echo, Cardiac Doppler, Color Doppler and Strain Analysis Indications:    R55 Syncope; I50.20* Unspecified systolic (congestive) heart                 failure  History:        Patient has no prior history of Echocardiogram examinations.                 CHF, CAD, Signs/Symptoms:Syncope; Risk Factors:Hypertension and                 Current Smoker.  Sonographer:    Pilar Jarvis RDMS, RVT, RDCS Referring Phys: [id number] [place]  1. Left ventricular ejection fraction, by estimation, is 55 to 60%. The left ventricle has normal function. The left ventricle has no regional wall motion abnormalities. Left  ventricular diastolic parameters are consistent with Grade II diastolic dysfunction (pseudonormalization). The average left ventricular global longitudinal strain is -19.5 %. The global longitudinal strain is normal.  2. Right ventricular systolic function is normal. The right ventricular size is normal.  3. The mitral valve is normal in structure. Mild mitral valve regurgitation.  4. The aortic valve is tricuspid. Aortic valve regurgitation is not visualized. FINDINGS  Left Ventricle: Left ventricular ejection fraction, by estimation, is 55 to 60%. The left ventricle has normal function. The left ventricle has no regional wall motion abnormalities. The average left ventricular global longitudinal strain is -19.5 %. The global longitudinal strain is normal. The left ventricular internal cavity size was normal in size. There is no left ventricular hypertrophy. Left ventricular diastolic parameters are consistent with Grade II diastolic dysfunction (pseudonormalization). Right Ventricle: The right ventricular size is normal. No increase in right ventricular wall thickness. Right ventricular systolic function is normal. Left Atrium: Left atrial size was normal in size. Right Atrium: Right atrial size was normal in size. Pericardium: There is no evidence of pericardial effusion. Mitral Valve: The mitral valve is normal in structure. Mild mitral valve regurgitation. Tricuspid Valve: The tricuspid valve is normal in structure. Tricuspid valve regurgitation is trivial. Aortic Valve: The aortic valve is tricuspid. Aortic valve regurgitation is not visualized. Aortic valve mean gradient measures 3.0 mmHg. Aortic valve peak gradient measures  6.9 mmHg. Aortic valve area, by VTI measures 2.59 cm. Pulmonic Valve: The pulmonic valve was normal in structure. Pulmonic valve regurgitation is not visualized. Aorta: The aortic root and ascending aorta are structurally normal, with no evidence of dilitation. Venous: The inferior vena  cava was not well visualized. IAS/Shunts: No atrial level shunt detected by color flow Doppler.  LEFT VENTRICLE PLAX 2D LVIDd:         4.90 cm     Diastology LVIDs:         3.00 cm     LV e' medial:    6.09 cm/s LV PW:         0.70 cm     LV E/e' medial:  20.9 LV IVS:        0.80 cm     LV e' lateral:   7.07 cm/s LVOT diam:     2.00 cm     LV E/e' lateral: 18.0 LV SV:         71 LV SV Index:   39          2D Longitudinal Strain LVOT Area:     3.14 cm    2D Strain GLS Avg:     -19.5 %  LV Volumes (MOD) LV vol d, MOD A2C: 83.6 ml LV vol d, MOD A4C: 93.8 ml LV vol s, MOD A2C: 39.5 ml LV vol s, MOD A4C: 41.8 ml LV SV MOD A2C:     44.1 ml LV SV MOD A4C:     93.8 ml LV SV MOD BP:      49.0 ml RIGHT VENTRICLE RV Basal diam:  2.90 cm RV S prime:     14.50 cm/s TAPSE (M-mode): 2.8 cm LEFT ATRIUM             Index        RIGHT ATRIUM           Index LA diam:        4.00 cm 2.23 cm/m   RA Area:     10.60 cm LA Vol (A2C):   35.3 ml 19.71 ml/m  RA Volume:   23.20 ml  12.95 ml/m LA Vol (A4C):   39.7 ml 22.17 ml/m LA Biplane Vol: 37.5 ml 20.94 ml/m  AORTIC VALVE                    PULMONIC VALVE AV Area (Vmax):    2.38 cm     PV Vmax:       1.15 m/s AV Area (Vmean):   2.52 cm     PV Peak grad:  5.3 mmHg AV Area (VTI):     2.59 cm AV Vmax:           131.00 cm/s AV Vmean:          84.900 cm/s AV VTI:            0.273 m AV Peak Grad:      6.9 mmHg AV Mean Grad:      3.0 mmHg LVOT Vmax:         99.20 cm/s LVOT Vmean:        68.100 cm/s LVOT VTI:          0.225 m LVOT/AV VTI ratio: 0.82  AORTA Ao Root diam: 3.00 cm Ao Asc diam:  2.60 cm MITRAL VALVE MV Area (PHT): 3.87 cm     SHUNTS MV Decel Time: 196 msec     Systemic VTI:  0.22 m MV E velocity: 127.00 cm/s  Systemic Diam: 2.00 cm MV A velocity: 118.00 cm/s MV E/A ratio:  1.08 Kate Sable MD Electronically signed by Kate Sable MD Signature Date/Time: 08/17/2022/4:39:12 PM    Final    CT ANGIO NECK W OR WO CONTRAST  Result Date: 08/17/2022 CLINICAL DATA:   Blacking out EXAM: CT ANGIOGRAPHY NECK TECHNIQUE: Multidetector CT imaging of the neck was performed using the standard protocol during bolus administration of intravenous contrast. Multiplanar CT image reconstructions and MIPs were obtained to evaluate the vascular anatomy. Carotid stenosis measurements (when applicable) are obtained utilizing NASCET criteria, using the distal internal carotid diameter as the denominator. RADIATION DOSE REDUCTION: This exam was performed according to the departmental dose-optimization program which includes automated exposure control, adjustment of the mA and/or kV according to patient size and/or use of iterative reconstruction technique. CONTRAST:  67m OMNIPAQUE IOHEXOL 350 MG/ML SOLN COMPARISON:  None Available. FINDINGS: Aortic arch: Standard branching. Imaged portion shows no evidence of aneurysm or dissection. No significant stenosis of the major arch vessel origins. Mild narrowing of the origin of the left subclavian artery. Right carotid system: Severe focal stenosis (approximately 80% in the proximal right ICA (series 6, image 170) Left carotid system: Approximately 50% stenosis of the proximal left ICA (series 6, image 160). Vertebral arteries: The left vertebral artery is diffusely small in caliber. There may be severe focal stenosis of the origin of the left vertebral artery (series 6, image 76). Moderate focal stenosis of the mid V2 segment of the right vertebral artery (series 6, image 130). The V1 segment of the right vertebral artery is poorly assessed due to streak artifact from venous contrast material. Skeleton: Negative straightening of normal cervical lordosis. Other neck: Mild soft tissue stranding over the midline suboccipital neck (series 5, image 98). Mucosal thickening bilateral ethmoid and left maxillary sinus. Carious dentition multiple periapical lucencies. Upper chest: Biapical bronchial wall thickening, nonspecific, but be seen in the setting of  bronchitis. IMPRESSION: 1. Severe focal stenosis (approximately 80%) at the proximal right ICA. 2. Approximately 50% stenosis of the proximal left ICA. 3. The left vertebral artery is diffusely small in caliber with possible severe focal stenosis at the origin of the left vertebral artery. Electronically Signed   By: HMarin RobertsM.D.   On: 08/17/2022 12:40   VAS [place]  Result Date: 08/17/2022 Carotid Arterial Duplex Study Patient Name:  Marcus [name] Date of Exam:   08/17/2022 Medical Rec #: [id number]        Accession #:    [id number] of Birth: 6January 26, 1967        Patient Gender: M Patient Age:   89years Exam Location:  [place] Procedure:      VAS [place] Referring Phys: RChristell Faith--------------------------------------------------------------------------------  Indications:                            Syncope and Multiple syncopal episodes                                         over the last several weeks. The last  one was 3 weeks ago.. Denies all other                                         cerebrovascular symptoms. Risk Factors:                           Hypertension, current smoker, coronary                                         artery disease. Comparison Study:                       No previous on record Pre-Surgical Evaluation & Surgical      Stenosis at RICA only. ICA is normal Correlation                             past the stenosis. Anatomy on the right                                         is within normal limits.Right                                         bifurcation is located near the Hyoid                                         Notch. Stenosis is >2 cm distal to the                                         LICA origin.ICA is not normal past                                         stenosis. Anatomy on the left is within                                         normal limits.Left bifurcation is                                          located near the Hyoid Notch. Performing Technologist: Pilar Jarvis RDMS, RVT, RDCS  Examination Guidelines: A complete evaluation includes B-mode imaging, spectral Doppler, color Doppler, and power Doppler as needed of all accessible portions of each vessel. Bilateral testing is considered an integral part of a complete examination. Limited examinations for reoccurring indications may be performed as noted.  Right Carotid Findings: +----------+--------+--------+--------+---------------------+------------------+           PSV cm/sEDV cm/sStenosisPlaque Description   Comments           +----------+--------+--------+--------+---------------------+------------------+  CCA Prox  88      15                                                      +----------+--------+--------+--------+---------------------+------------------+ CCA Distal56      14      <50%    smooth, homogeneous                                                       and diffuse                             +----------+--------+--------+--------+---------------------+------------------+ ICA Prox  224     74      60-79%  homogeneous and      Scant flow through                                   smooth               the stenosis       +----------+--------+--------+--------+---------------------+------------------+ ICA Mid   86      27      1-39%   homogeneous and                                                           smooth                                  +----------+--------+--------+--------+---------------------+------------------+ ICA Distal130     29                                                      +----------+--------+--------+--------+---------------------+------------------+ ECA       386     51      >50%    smooth and                                                                homogeneous                              +----------+--------+--------+--------+---------------------+------------------+ +----------+--------+-------+----------------+-------------------+           PSV cm/sEDV cmsDescribe        Arm Pressure (mmHG) +----------+--------+-------+----------------+-------------------+ Subclavian132            Multiphasic, [id number]                 +----------+--------+-------+----------------+-------------------+ +---------+--------+--------+--------------+  [place] cm/sEDV cm/sNot identified +---------+--------+--------+--------------+ Pre-stenosis 187/74 cm/s Post-stenosis 86/27 cm/s Left Carotid Findings: +----------+--------+--------+--------+---------------------+------------------+           PSV cm/sEDV cm/sStenosisPlaque Description   Comments           +----------+--------+--------+--------+---------------------+------------------+ CCA Prox  159     29                                                      +----------+--------+--------+--------+---------------------+------------------+ CCA Distal80      20      <50%    smooth and                                                                heterogenous                            +----------+--------+--------+--------+---------------------+------------------+ ICA Prox  389     120     80-99%  diffuse, homogeneous                                                      and smooth                              +----------+--------+--------+--------+---------------------+------------------+ ICA Mid   279     60      40-59%  smooth and           high end of 40-59%                                   homogeneous                             +----------+--------+--------+--------+---------------------+------------------+ ICA Distal174     27                                                      +----------+--------+--------+--------+---------------------+------------------+ ECA       270     51       >50%    smooth and                                                                homogeneous                             +----------+--------+--------+--------+---------------------+------------------+ +----------+--------+--------+--------+-------------------+  PSV cm/sEDV cm/sDescribeArm Pressure (mmHG) +----------+--------+--------+--------+-------------------+ Subclavian300             Stenotic152                 +----------+--------+--------+--------+-------------------+ +---------+--------+--+--------+-+---------------------------------+ VertebralPSV cm/s45EDV cm/s8Scant flow via color flow Doppler +---------+--------+--+--------+-+---------------------------------+ Left BP 152/86 mmHg Pre-stenosis 166/46 cm/s Post-stenosis 283/29 cm/s spectral broadening  Summary: Right Carotid: Velocities in the right ICA are consistent with a 60-79%                stenosis. Non-hemodynamically significant plaque <50% noted in                the CCA. The ECA appears >50% stenosed. Left Carotid: Velocities in the left ICA are consistent with a 80-99% stenosis.               Non-hemodynamically significant plaque <50% noted in the CCA. The               ECA appears >50% stenosed. Vertebrals:  Left vertebral artery demonstrates antegrade flow. Right vertebral              artery was not visualized. Scant flow in left vertebral artery. Subclavians: Left subclavian artery was stenotic. Normal flow hemodynamics were              seen in the right subclavian artery. *See table(s) above for measurements and observations.  Suggest Peripheral Vascular Consult. Electronically signed by Carlyle Dolly MD on 08/17/2022 at 11:51:47 AM.    Final     Labs Recent Results (from the past 2160 hour(s))  Surgical pathology     Status: None   Collection Time: 08/12/22  9:36 AM  Result Value Ref Range   SURGICAL PATHOLOGY      SURGICAL PATHOLOGY CASE: [id number] PATIENT: Marcus [name] Surgical  Pathology Report     Specimen Submitted: A. Stomach, body; cbx B. Rectum polyp; hot snare  Clinical History: Abdominal pain, epigastric R10.13, dyspepsia R10.13, gastroesophageal reflux disease, unspecified whether esophagitis present K21.9, history of partial gastrectomy Z90.3, history of gastric ulcer Z878.11 history of Nissen fundoplication 0000000, colon cancer screening Z12.11.  Surgical changes pyloric, gastritis, Nissen fundoplication, diverticulosis, rectal polyp      DIAGNOSIS: A.  STOMACH; BIOPSY: - GASTRIC BODY TYPE MUCOSA WITH NO SPECIFIC HISTOLOGIC ABNORMALITY.  B.  RECTUM, POLYP; HOT SNARE BIOPSIES: - TUBULAR ADENOMA - NO EVIDENCE OF HIGH-GRADE DYSPLASIA OR MALIGNANCY.  GROSS DESCRIPTION: A. Labeled: cbx gastric body rule out H. pylori Received: Formalin Collection time: 9:36 AM on 08/12/2022 Placed into formalin time: 9:36 AM on 08/12/2022 Tissue fragment(s): 1 Size : 0.5 x 0.2 x 0.1 cm Description: Tan elongated soft tissue fragment Entirely submitted in 1 cassette.  B. Labeled: Hot snare rectal polyp x 1 Received: Formalin Collection time: 10:41 AM on 08/12/2022 Placed into formalin time: 10:41 AM on 08/12/2022 Tissue fragment(s): 1 Size: 0.7 x 0.5 x 0.5 cm Description: Received is a fragment of tan-pink soft tissue.  The resection margin is inked green and the specimen is bisected. Entirely submitted in 1 cassette.  RB 08/12/2022  Final Diagnosis performed by Theodora Blow, MD.   Electronically signed 08/13/2022 12:21:27PM The electronic signature indicates that the named Attending Pathologist has evaluated the specimen Technical component performed at [facility], [address] [place], [place] [address] Lab: [phone] Dir: Rush Farmer, MD, MMM  Professional component performed at [facility], [facility], [place], [place], [place] [address] Lab: [phone] Dir: Kathi Simpers, MD   Direct  LDL     Status: None    Collection Time: 08/12/22  3:40 PM  Result Value Ref Range   Direct LDL 45 0 - 99 mg/dL    Comment: Performed at [facility], [place] [address] [place], [place] [address]  Lipid Profile     Status: None   Collection Time: 08/12/22  3:40 PM  Result Value Ref Range   Cholesterol 138 0 - 200 mg/dL   Triglycerides 143 <150 mg/dL   HDL 61 >40 mg/dL   Total CHOL/HDL Ratio 2.3 RATIO   VLDL 29 0 - 40 mg/dL   LDL Cholesterol 48 0 - 99 mg/dL    Comment:        Total Cholesterol/HDL:CHD Risk Coronary Heart Disease Risk Table                     Men   Women  1/2 Average Risk   3.4   3.3  Average Risk       5.0   4.4  2 X Average Risk   9.6   7.1  3 X Average Risk  23.4   11.0        Use the calculated Patient Ratio above and the CHD Risk Table to determine the patient's CHD Risk.        ATP III CLASSIFICATION (LDL):  <100     mg/dL   Optimal  100-129  mg/dL   Near or Above                    Optimal  130-159  mg/dL   Borderline  160-189  mg/dL   High  >190     mg/dL   Very High Performed at [facility], [place], [place] [address]   Comp Met (CMET)     Status: Abnormal   Collection Time: 08/12/22  3:40 PM  Result Value Ref Range   Sodium 138 135 - 145 mmol/L   Potassium 4.3 3.5 - 5.1 mmol/L   Chloride 102 98 - 111 mmol/L   CO2 30 22 - 32 mmol/L   Glucose, Bld 84 70 - 99 mg/dL    Comment: Glucose reference range applies only to samples taken after fasting for at least 8 hours.   BUN 23 (H) 6 - 20 mg/dL   Creatinine, Ser 1.36 (H) 0.61 - 1.24 mg/dL   Calcium 9.2 8.9 - 10.3 mg/dL   Total Protein 7.7 6.5 - 8.1 g/dL   Albumin 4.2 3.5 - 5.0 g/dL   AST 31 15 - 41 U/L   ALT 36 0 - 44 U/L   Alkaline Phosphatase 60 38 - 126 U/L   Total Bilirubin 0.6 0.3 - 1.2 mg/dL   GFR, Estimated >60 >60 mL/min    Comment: (NOTE) Calculated using the CKD-EPI Creatinine Equation (2021)    Anion gap 6 5 - 15    Comment: Performed at [facility], [address] [place], [place] [address]  CBC     Status: None   Collection Time: 08/12/22  3:40 PM  Result Value Ref Range   WBC 7.9 4.0 - 10.5 K/uL   RBC 4.57 4.22 - 5.81 MIL/uL   Hemoglobin 13.9 13.0 - 17.0 g/dL   HCT 41.5 39.0 - 52.0 %   MCV 90.8 80.0 - 100.0 fL   MCH 30.4 26.0 - 34.0 pg   MCHC 33.5 30.0 - 36.0 g/dL   RDW 13.3 11.5 -  15.5 %   Platelets 316 150 - 400 K/uL   nRBC 0.0 0.0 - 0.2 %    Comment: Performed at [facility], [address] [place], [place] [address]  ECHOCARDIOGRAM COMPLETE     Status: None   Collection Time: 08/17/22 10:54 AM  Result Value Ref Range   AR max vel 2.38 cm2   AV Peak grad 6.9 mmHg   Ao pk vel 1.31 m/s   S' Lateral 3.00 cm   Area-P 1/2 3.87 cm2   AV Area VTI 2.59 cm2   AV Mean grad 3.0 mmHg   Single Plane A4C EF 55.4 %   Single Plane A2C EF 52.8 %   Calc EF 54.6 %   AV Area mean vel 2.52 cm2   Est EF 55 - 60%     Assessment/Plan:  Carotid stenosis I have independently reviewed his CT angiogram formed for carotid disease.  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  Left carotid stenosis is estimated to be in the 50% range which seems reasonable.  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 I had a long discussion with the patient today regarding treatment options.  He is on aspirin and a statin agent.  I am going to add Plavix to his regimen as well.  I discussed the differences between carotid endarterectomy and carotid stenting.  I discussed that at his high degree of stenosis, intervention is recommended for stroke risk reduction.  Discussed the differences between the 2 procedures and the pros and cons of the 2 procedures.  The patient voices understanding and desires to proceed with carotid stenting which seems reasonable.  The  lesion is fairly high although maybe not prohibitively high for surgery, it may be difficult to get superior to the lesion surgically based off of his CT scan.  We will go ahead and prescribe Plavix and we will get him on the schedule for carotid stent placement next week.  I will defer further cardiac workup to his cardiologist.  CAD in native artery Severe disease.  Follows with cardiology. May be high risk for general anesthesia.  Hypertension blood pressure control important in reducing the progression of atherosclerotic disease. On appropriate oral medications.   Polysubstance abuse Cocaine and tobacco increases risk of recurrent and progressive atherosclerotic vascular disease.      Leotis Pain 08/18/2022, 11:48 AM   This note was created with Dragon medical transcription system.  Any errors from dictation are unintentional.

## 2022-08-20 ENCOUNTER — Telehealth: Payer: Self-pay | Admitting: Cardiovascular Disease

## 2022-08-20 NOTE — Telephone Encounter (Signed)
Spoke with patient and he had questions about the stenting Dr. Lucky Cowboy mentioned. He also wanted to know about the blood thinner he sent into the pharmacy. Advised that he would need to reach out to their office for further clarification and to answer his questions since they are scheduling the procedure and they started the medication. He reports calling them today but has not heard back from them yet. Advised that it can sometimes take 24-48 hours for them to return calls and that if he doesn't hear back from them to call again. He verbalized understanding with no further questions at this time.

## 2022-08-20 NOTE — Telephone Encounter (Signed)
Patient calling back with question/concerns on the info he received from dr dew. He was under the impression that that our office is suppose to called him so that he can have a stint done. Please advise

## 2022-08-21 ENCOUNTER — Telehealth: Payer: Self-pay | Admitting: Physician Assistant

## 2022-08-21 NOTE — Telephone Encounter (Signed)
DUPLICATE. See other telephone encounter.

## 2022-08-21 NOTE — Telephone Encounter (Signed)
After discussion with primary cardiologist, we will proceed with LHC early next week, preferably 2/5 or 2/6.  Vascular procedure can proceed afterwards. Risks and benefits of cardiac cath were discussed in detail at his office visit (please see documentation).

## 2022-08-21 NOTE — Telephone Encounter (Signed)
Spoke with patient and reviewed that they would like to proceed with the cardiac cath. We discussed request to have this done next week. Patient states that he is only able to do this on the 7th or the 9th. Advised that someone would give him a call on Monday to get this arranged. He verbalized understanding with no further questions.    Spoke with Christell Faith PA-C to advise what days patient can do the procedure. Scheduling has closed so unable to arrange date & time today. Provider requested that I please have another nurse take care of this on Monday because I am off. Dr. Saunders Revel is in the cath lab on 7th & 9th but is also in clinic. Provider would like him scheduled at 07:30 am on one of those days since Dr. Saunders Revel is also in clinic. Reviewed all of this information with Dr. Darnelle Bos nurse for further assistance with scheduling on Monday.

## 2022-08-21 NOTE — Telephone Encounter (Signed)
Pt is returning Pam's call.

## 2022-08-21 NOTE — Telephone Encounter (Signed)
Left voicemail message to call back  

## 2022-08-24 NOTE — Telephone Encounter (Signed)
Called pt to inform cath has been scheduled for 2/7 with Dr. Saunders Revel and to arrive by 6:30 am.   Pt verbalized understanding.

## 2022-08-25 ENCOUNTER — Telehealth (INDEPENDENT_AMBULATORY_CARE_PROVIDER_SITE_OTHER): Payer: Self-pay

## 2022-08-25 ENCOUNTER — Other Ambulatory Visit: Payer: Self-pay | Admitting: Physician Assistant

## 2022-08-25 NOTE — Progress Notes (Signed)
Orders for LHC placed. Will start patient on IVF 125 mL/hr upon arrival to [place].

## 2022-08-25 NOTE — Telephone Encounter (Signed)
Spoke with the patient and he is scheduled with Dr. Lucky Cowboy on 08/31/22 for a right carotid stent placement with [facility] 10:15 [facility]. Pre-procedure instructions were discussed and will be mailed.

## 2022-08-26 ENCOUNTER — Encounter
Admission: RE | Disposition: A | Discharge status: Home / Self Care | Payer: Self-pay | Source: Home / Self Care | Attending: Internal Medicine

## 2022-08-26 ENCOUNTER — Other Ambulatory Visit: Payer: Self-pay

## 2022-08-26 ENCOUNTER — Observation Stay
Admission: RE | Admit: 2022-08-26 | Discharge: 2022-08-27 | Disposition: A | Discharge status: Home / Self Care | Payer: Medicaid Other | Attending: Internal Medicine | Admitting: Internal Medicine

## 2022-08-26 ENCOUNTER — Encounter: Payer: Self-pay | Admitting: Internal Medicine

## 2022-08-26 ENCOUNTER — Observation Stay: Payer: Medicaid Other | Admitting: Certified Registered Nurse Anesthetist

## 2022-08-26 DIAGNOSIS — E785 Hyperlipidemia, unspecified: Secondary | ICD-10-CM | POA: Insufficient documentation

## 2022-08-26 DIAGNOSIS — Z79899 Other long term (current) drug therapy: Secondary | ICD-10-CM | POA: Diagnosis not present

## 2022-08-26 DIAGNOSIS — Y832 Surgical operation with anastomosis, bypass or graft as the cause of abnormal reaction of the patient, or of later complication, without mention of misadventure at the time of the procedure: Secondary | ICD-10-CM | POA: Diagnosis not present

## 2022-08-26 DIAGNOSIS — I6529 Occlusion and stenosis of unspecified carotid artery: Secondary | ICD-10-CM | POA: Diagnosis present

## 2022-08-26 DIAGNOSIS — I6523 Occlusion and stenosis of bilateral carotid arteries: Secondary | ICD-10-CM | POA: Insufficient documentation

## 2022-08-26 DIAGNOSIS — I110 Hypertensive heart disease with heart failure: Secondary | ICD-10-CM | POA: Diagnosis not present

## 2022-08-26 DIAGNOSIS — I25110 Atherosclerotic heart disease of native coronary artery with unstable angina pectoris: Principal | ICD-10-CM

## 2022-08-26 DIAGNOSIS — Z7982 Long term (current) use of aspirin: Secondary | ICD-10-CM | POA: Diagnosis not present

## 2022-08-26 DIAGNOSIS — I255 Ischemic cardiomyopathy: Secondary | ICD-10-CM | POA: Diagnosis not present

## 2022-08-26 DIAGNOSIS — I2510 Atherosclerotic heart disease of native coronary artery without angina pectoris: Secondary | ICD-10-CM | POA: Insufficient documentation

## 2022-08-26 DIAGNOSIS — F17210 Nicotine dependence, cigarettes, uncomplicated: Secondary | ICD-10-CM | POA: Insufficient documentation

## 2022-08-26 DIAGNOSIS — I200 Unstable angina: Secondary | ICD-10-CM

## 2022-08-26 DIAGNOSIS — Z955 Presence of coronary angioplasty implant and graft: Secondary | ICD-10-CM | POA: Diagnosis not present

## 2022-08-26 DIAGNOSIS — F1410 Cocaine abuse, uncomplicated: Secondary | ICD-10-CM | POA: Diagnosis not present

## 2022-08-26 DIAGNOSIS — Z7902 Long term (current) use of antithrombotics/antiplatelets: Secondary | ICD-10-CM | POA: Insufficient documentation

## 2022-08-26 DIAGNOSIS — T82855A Stenosis of coronary artery stent, initial encounter: Secondary | ICD-10-CM | POA: Diagnosis not present

## 2022-08-26 DIAGNOSIS — I2584 Coronary atherosclerosis due to calcified coronary lesion: Secondary | ICD-10-CM | POA: Diagnosis not present

## 2022-08-26 DIAGNOSIS — I252 Old myocardial infarction: Secondary | ICD-10-CM | POA: Insufficient documentation

## 2022-08-26 DIAGNOSIS — I10 Essential (primary) hypertension: Secondary | ICD-10-CM | POA: Diagnosis present

## 2022-08-26 DIAGNOSIS — I5020 Unspecified systolic (congestive) heart failure: Secondary | ICD-10-CM | POA: Insufficient documentation

## 2022-08-26 HISTORY — DX: Disorder of arteries and arterioles, unspecified: I77.9

## 2022-08-26 HISTORY — PX: CORONARY STENT INTERVENTION: CATH118234

## 2022-08-26 HISTORY — PX: LEFT HEART CATH AND CORONARY ANGIOGRAPHY: CATH118249

## 2022-08-26 LAB — POCT ACTIVATED CLOTTING TIME
Activated Clotting Time: 304 seconds
Activated Clotting Time: 266 seconds
Activated Clotting Time: 325 seconds

## 2022-08-26 SURGERY — LEFT HEART CATH AND CORONARY ANGIOGRAPHY
Anesthesia: Moderate Sedation

## 2022-08-26 MED ORDER — IRBESARTAN 150 MG PO TABS
300.0000 mg | ORAL_TABLET | Freq: Every day | ORAL | Status: DC
  Administered 2022-08-26 – 2022-08-27 (×2): 300 mg via ORAL
  Filled 2022-08-26 (×2): qty 2

## 2022-08-26 MED ORDER — PANTOPRAZOLE SODIUM 40 MG PO TBEC
40.0000 mg | DELAYED_RELEASE_TABLET | Freq: Every day | ORAL | Status: DC
  Administered 2022-08-26 – 2022-08-27 (×2): 40 mg via ORAL
  Filled 2022-08-26 (×2): qty 1

## 2022-08-26 MED ORDER — QUETIAPINE FUMARATE ER 50 MG PO TB24
100.0000 mg | ORAL_TABLET | Freq: Every day | ORAL | Status: DC
  Administered 2022-08-26: 100 mg via ORAL
  Filled 2022-08-26: qty 2

## 2022-08-26 MED ORDER — ASPIRIN 81 MG PO TBEC
81.0000 mg | DELAYED_RELEASE_TABLET | Freq: Every day | ORAL | Status: DC
  Administered 2022-08-27: 81 mg via ORAL
  Filled 2022-08-26: qty 1

## 2022-08-26 MED ORDER — HEPARIN (PORCINE) IN NACL 1000-0.9 UT/500ML-% IV SOLN
INTRAVENOUS | Status: DC | PRN
  Administered 2022-08-26 (×3): 500 mL

## 2022-08-26 MED ORDER — MIDAZOLAM HCL 2 MG/2ML IJ SOLN
INTRAMUSCULAR | Status: AC
  Filled 2022-08-26: qty 2

## 2022-08-26 MED ORDER — HYDROXYZINE HCL 25 MG PO TABS
25.0000 mg | ORAL_TABLET | Freq: Two times a day (BID) | ORAL | Status: DC
  Administered 2022-08-26 – 2022-08-27 (×2): 25 mg via ORAL
  Filled 2022-08-26 (×2): qty 1

## 2022-08-26 MED ORDER — IOHEXOL 300 MG/ML  SOLN
INTRAMUSCULAR | Status: DC | PRN
  Administered 2022-08-26: 106 mL

## 2022-08-26 MED ORDER — VERAPAMIL HCL 2.5 MG/ML IV SOLN
INTRAVENOUS | Status: AC
  Filled 2022-08-26: qty 2

## 2022-08-26 MED ORDER — SODIUM CHLORIDE 0.9% FLUSH: 3.0000 mL | INTRAVENOUS | Status: DC | PRN

## 2022-08-26 MED ORDER — HYDRALAZINE HCL 20 MG/ML IJ SOLN: 10.0000 mg | INTRAMUSCULAR | Status: AC | PRN

## 2022-08-26 MED ORDER — NITROGLYCERIN 1 MG/10 ML FOR IR/CATH LAB
INTRA_ARTERIAL | Status: AC
  Filled 2022-08-26: qty 10

## 2022-08-26 MED ORDER — MOMETASONE FURO-FORMOTEROL FUM 200-5 MCG/ACT IN AERO
2.0000 | INHALATION_SPRAY | Freq: Two times a day (BID) | RESPIRATORY_TRACT | Status: DC
  Administered 2022-08-26 – 2022-08-27 (×2): 2 via RESPIRATORY_TRACT
  Filled 2022-08-26: qty 8.8

## 2022-08-26 MED ORDER — HEPARIN SODIUM (PORCINE) 1000 UNIT/ML IJ SOLN
INTRAMUSCULAR | Status: DC | PRN
  Administered 2022-08-26: 3000 [IU] via INTRAVENOUS
  Administered 2022-08-26 (×2): 4000 [IU] via INTRAVENOUS

## 2022-08-26 MED ORDER — HYDROXYZINE PAMOATE 25 MG PO CAPS: 25.0000 mg | ORAL_CAPSULE | Freq: Two times a day (BID) | ORAL | Status: DC

## 2022-08-26 MED ORDER — LABETALOL HCL 5 MG/ML IV SOLN: 10.0000 mg | INTRAVENOUS | Status: AC | PRN

## 2022-08-26 MED ORDER — CLOPIDOGREL BISULFATE 75 MG PO TABS
ORAL_TABLET | ORAL | Status: DC | PRN
  Administered 2022-08-26: 600 mg via ORAL

## 2022-08-26 MED ORDER — ONDANSETRON HCL 4 MG/2ML IJ SOLN: 4.0000 mg | Freq: Four times a day (QID) | INTRAMUSCULAR | Status: DC | PRN

## 2022-08-26 MED ORDER — HEPARIN SODIUM (PORCINE) 1000 UNIT/ML IJ SOLN
INTRAMUSCULAR | Status: AC
  Filled 2022-08-26: qty 10

## 2022-08-26 MED ORDER — SODIUM CHLORIDE 0.9 % IV SOLN: INTRAVENOUS | Status: DC

## 2022-08-26 MED ORDER — ENOXAPARIN SODIUM 40 MG/0.4ML IJ SOSY
40.0000 mg | PREFILLED_SYRINGE | INTRAMUSCULAR | Status: DC
  Filled 2022-08-26: qty 0.4

## 2022-08-26 MED ORDER — HEPARIN (PORCINE) IN NACL 1000-0.9 UT/500ML-% IV SOLN
INTRAVENOUS | Status: AC
  Filled 2022-08-26: qty 500

## 2022-08-26 MED ORDER — CLOPIDOGREL BISULFATE 75 MG PO TABS
ORAL_TABLET | ORAL | Status: AC
  Filled 2022-08-26: qty 8

## 2022-08-26 MED ORDER — NITROGLYCERIN 0.4 MG SL SUBL
0.4000 mg | SUBLINGUAL_TABLET | SUBLINGUAL | Status: DC | PRN
  Administered 2022-08-26 (×2): 0.4 mg via SUBLINGUAL
  Filled 2022-08-26: qty 1

## 2022-08-26 MED ORDER — ALBUTEROL SULFATE (2.5 MG/3ML) 0.083% IN NEBU: 3.0000 mL | INHALATION_SOLUTION | Freq: Four times a day (QID) | RESPIRATORY_TRACT | Status: DC | PRN

## 2022-08-26 MED ORDER — SODIUM CHLORIDE 0.9 % IV SOLN: INTRAVENOUS | Status: AC

## 2022-08-26 MED ORDER — FENTANYL CITRATE (PF) 100 MCG/2ML IJ SOLN
INTRAMUSCULAR | Status: DC | PRN
  Administered 2022-08-26 (×2): 25 ug via INTRAVENOUS

## 2022-08-26 MED ORDER — OLMESARTAN MEDOXOMIL-HCTZ 40-12.5 MG PO TABS: 1.0000 | ORAL_TABLET | Freq: Every day | ORAL | Status: DC

## 2022-08-26 MED ORDER — MIDAZOLAM HCL 2 MG/2ML IJ SOLN
INTRAMUSCULAR | Status: DC | PRN
  Administered 2022-08-26 (×2): 1 mg via INTRAVENOUS

## 2022-08-26 MED ORDER — SODIUM CHLORIDE 0.9 % IV SOLN: 250.0000 mL | INTRAVENOUS | Status: DC | PRN

## 2022-08-26 MED ORDER — ACETAMINOPHEN 325 MG PO TABS: 650.0000 mg | ORAL_TABLET | ORAL | Status: DC | PRN

## 2022-08-26 MED ORDER — FENTANYL CITRATE (PF) 100 MCG/2ML IJ SOLN
INTRAMUSCULAR | Status: AC
  Filled 2022-08-26: qty 2

## 2022-08-26 MED ORDER — ROSUVASTATIN CALCIUM 20 MG PO TABS
40.0000 mg | ORAL_TABLET | Freq: Every day | ORAL | Status: DC
  Administered 2022-08-26 – 2022-08-27 (×2): 40 mg via ORAL
  Filled 2022-08-26 (×3): qty 2

## 2022-08-26 MED ORDER — CLOPIDOGREL BISULFATE 75 MG PO TABS
75.0000 mg | ORAL_TABLET | Freq: Every day | ORAL | Status: DC
  Administered 2022-08-27: 75 mg via ORAL
  Filled 2022-08-26: qty 1

## 2022-08-26 MED ORDER — NITROGLYCERIN 1 MG/10 ML FOR IR/CATH LAB
INTRA_ARTERIAL | Status: DC | PRN
  Administered 2022-08-26 (×2): 200 ug via INTRACORONARY

## 2022-08-26 MED ORDER — DULOXETINE HCL 30 MG PO CPEP
30.0000 mg | ORAL_CAPSULE | Freq: Every day | ORAL | Status: DC
  Administered 2022-08-26 – 2022-08-27 (×2): 30 mg via ORAL
  Filled 2022-08-26 (×2): qty 1

## 2022-08-26 MED ORDER — HYDROCHLOROTHIAZIDE 12.5 MG PO TABS
12.5000 mg | ORAL_TABLET | Freq: Every day | ORAL | Status: DC
  Administered 2022-08-26 – 2022-08-27 (×2): 12.5 mg via ORAL
  Filled 2022-08-26 (×2): qty 1

## 2022-08-26 MED ORDER — METOPROLOL SUCCINATE ER 50 MG PO TB24
50.0000 mg | ORAL_TABLET | Freq: Every day | ORAL | Status: DC
  Administered 2022-08-26 – 2022-08-27 (×2): 50 mg via ORAL
  Filled 2022-08-26 (×2): qty 1

## 2022-08-26 MED ORDER — SODIUM CHLORIDE 0.9% FLUSH: 3.0000 mL | Freq: Two times a day (BID) | INTRAVENOUS | Status: DC

## 2022-08-26 MED ORDER — HEPARIN (PORCINE) IN NACL 1000-0.9 UT/500ML-% IV SOLN
INTRAVENOUS | Status: AC
  Filled 2022-08-26: qty 1000

## 2022-08-26 MED ORDER — SODIUM CHLORIDE 0.9% FLUSH
3.0000 mL | Freq: Two times a day (BID) | INTRAVENOUS | Status: DC
  Administered 2022-08-26 – 2022-08-27 (×2): 3 mL via INTRAVENOUS

## 2022-08-26 MED ORDER — VERAPAMIL HCL 2.5 MG/ML IV SOLN
INTRAVENOUS | Status: DC | PRN
  Administered 2022-08-26 (×2): 2.5 mg via INTRA_ARTERIAL

## 2022-08-26 SURGICAL SUPPLY — 24 items
BALLN MINITREK RX 2.0X12 (BALLOONS) ×2
BALLN SCOREFLEX 2.50X15 (BALLOONS) ×2
BALLN ~~LOC~~ TREK NEO RX 2.75X12 (BALLOONS) IMPLANT
BALLN ~~LOC~~ TREK NEO RX 3.0X15 (BALLOONS) ×2
BALLOON MINITREK RX 2.0X12 (BALLOONS) IMPLANT
BALLOON SCOREFLEX 2.50X15 (BALLOONS) IMPLANT
BALLOON ~~LOC~~ TREK NEO RX 3.0X15 (BALLOONS) IMPLANT
CATH INFINITI 5FR MULTPACK ANG (CATHETERS) IMPLANT
CATH SHOCKWAVE C2 3.0X12 (CATHETERS) IMPLANT
CATH VISTA GUIDE 6FR XBLAD3.5 (CATHETERS) IMPLANT
DEVICE RAD TR BAND REGULAR (VASCULAR PRODUCTS) IMPLANT
DRAPE BRACHIAL (DRAPES) IMPLANT
GLIDESHEATH SLEND SS 6F .021 (SHEATH) IMPLANT
GUIDEWIRE INQWIRE 1.5J.035X260 (WIRE) IMPLANT
INQWIRE 1.5J .035X260CM (WIRE) ×2
KIT ENCORE 26 ADVANTAGE (KITS) IMPLANT
KIT SYRINGE INJ CVI SPIKEX1 (MISCELLANEOUS) IMPLANT
PACK CARDIAC CATH (CUSTOM PROCEDURE TRAY) ×2 IMPLANT
PROTECTION STATION PRESSURIZED (MISCELLANEOUS) ×2
SET ATX SIMPLICITY (MISCELLANEOUS) IMPLANT
STATION PROTECTION PRESSURIZED (MISCELLANEOUS) IMPLANT
STENT ONYX FRONTIER 2.75X18 (Permanent Stent) IMPLANT
TUBING CIL FLEX 10 FLL-RA (TUBING) IMPLANT
WIRE RUNTHROUGH .014X180CM (WIRE) IMPLANT

## 2022-08-26 NOTE — Interval H&P Note (Signed)
History and Physical Interval Note:  08/26/2022 7:37 AM  Eilene Ghazi  has presented today for surgery, with the diagnosis of unstable angina.  The various methods of treatment have been discussed with the patient and family. After consideration of risks, benefits and other options for treatment, the patient has consented to  Procedure(s): LEFT HEART CATH AND CORONARY ANGIOGRAPHY (Left) as a surgical intervention.  The patient's history has been reviewed, patient examined, no change in status, stable for surgery.  I have reviewed the patient's chart and labs.  Questions were answered to the patient's satisfaction.    Cath Lab Visit (complete for each Cath Lab visit)  Clinical Evaluation Leading to the Procedure:   ACS: No.  Non-ACS:    Anginal Classification: CCS IV  Anti-ischemic medical therapy: Minimal Therapy (1 class of medications)  Non-Invasive Test Results: No non-invasive testing performed  Prior CABG: No previous CABG  Manda Holstad

## 2022-08-26 NOTE — Progress Notes (Signed)
Rounding Note    Patient Name: Marcus [name] Date of Encounter: 08/26/2022  [place] Cardiologist: Ida Rogue, MD   Subjective   I was alerted by Marcus [name]'s nurse that he complained of 7/10 sharp chest pain this afternoon.  On further questioning, Marcus [name] reports that he had chest pain that began during his PCI and persisted around a 3/10 level in the recovery area.  It seems to worsen when he breathes then, particularly through the nose.  He does not report frank shortness of breath nor palpitations or lightheadedness.  Inpatient Medications    Scheduled Meds:  [START ON 08/27/2022] aspirin EC  81 mg Oral Daily   [START ON 08/27/2022] clopidogrel  75 mg Oral Daily   DULoxetine  30 mg Oral Daily   [START ON 08/27/2022] enoxaparin (LOVENOX) injection  40 mg Subcutaneous Q24H   hydrochlorothiazide  12.5 mg Oral Daily   And   irbesartan  300 mg Oral Daily   hydrOXYzine  25 mg Oral BID   metoprolol succinate  50 mg Oral Daily   mometasone-formoterol  2 puff Inhalation BID   pantoprazole  40 mg Oral Daily   QUEtiapine  100 mg Oral QHS   rosuvastatin  40 mg Oral Daily   sodium chloride flush  3 mL Intravenous Q12H   Continuous Infusions:  sodium chloride     PRN Meds: sodium chloride, acetaminophen, albuterol, nitroGLYCERIN, ondansetron (ZOFRAN) IV, sodium chloride flush   Vital Signs    Vitals:   08/26/22 1633 08/26/22 1720 08/26/22 1726 08/26/22 1730  BP: (!) 156/68 (!) 152/67 129/79 130/67  Pulse: 72 67 84 79  Resp: 20     Temp: 97.7 F (36.5 C)     TempSrc: Oral     SpO2: 97% 98% 96% 96%  Weight:      Height:       No intake or output data in the 24 hours ending 08/26/22 1736    08/26/2022    6:56 AM 08/18/2022   11:00 AM 08/12/2022    2:25 PM  Last 3 Weights  Weight (lbs) 170 lb 167 lb 167 lb  Weight (kg) 77.111 kg 75.751 kg 75.751 kg      Telemetry    Normal sinus rhythm- Personally Reviewed  ECG    Normal sinus rhythm with right  bundle branch block and lateral infarct.  No significant change from prior tracings.  Physical Exam   GEN: No acute distress.   Neck: No JVD Cardiac: RRR, no murmurs, rubs, or gallops.  Respiratory: Clear to auscultation bilaterally. GI: Soft, nontender, non-distended  MS: No edema; No deformity.  Slight bruising and fullness above the left radial arteriotomy site without hematoma.  Covered with clean dressing. Neuro:  Nonfocal  Psych: Flat affect   Labs    High Sensitivity Troponin:  No results for input(s): "TROPONINIHS" in the last 720 hours.   ChemistryNo results for input(s): "NA", "K", "CL", "CO2", "GLUCOSE", "BUN", "CREATININE", "CALCIUM", "MG", "PROT", "ALBUMIN", "AST", "ALT", "ALKPHOS", "BILITOT", "GFRNONAA", "GFRAA", "ANIONGAP" in the last 168 hours.  Lipids No results for input(s): "CHOL", "TRIG", "HDL", "LABVLDL", "LDLCALC", "CHOLHDL" in the last 168 hours.  HematologyNo results for input(s): "WBC", "RBC", "HGB", "HCT", "MCV", "MCH", "MCHC", "RDW", "PLT" in the last 168 hours. Thyroid No results for input(s): "TSH", "FREET4" in the last 168 hours.  BNPNo results for input(s): "BNP", "PROBNP" in the last 168 hours.  DDimer No results for input(s): "DDIMER" in the last 168 hours.  Radiology    CARDIAC CATHETERIZATION  Result Date: 08/26/2022 Conclusions: Severe single vessel coronary artery disease with 50-60% in-stent restenosis of ramus stent and 90% de-novo lesion just distal to the stent. Mild-moderate, noncritical disease involving the LAD, LCx, and RCA.  There is a 70% stenosis in the apical LAD, which is too small/distal for percutaneous intervention Mildly elevated left ventricular for pressure (LVEDP 19 mmHg). Successful PCI and intracoronary lithotripsy to ramus intermedius with reduction of in-stent restenosis to 20-30% and reduction of de-novo lesion to 0% with TIMI-3 flow.  Recommendations: Overnight observation. Dual antiplatelet therapy with aspirin and clopidogrel  for at least 12 months. Aggressive secondary prevention of coronary artery disease. Marcus Bush, MD Marcus [name] Marcus [name]   Cardiac Studies   See cath above.  Patient Profile     57 y.o. male man presenting with unstable angina status post PCI to ramus intermedius.  Assessment & Plan    Unstable angina with post PCI chest pain: Chest pain began during intervention, potentially due to microvascular dysfunction and/or stretching of the vessel.  He also noted increased chest pain during intracoronary lithotripsy.  He was given sublingual nitroglycerin x 2 with resolution of his chest pain this evening.  EKG shows no significant change from immediate post PCI tracing as well as prior tracings.  I do not think that his pain is related to acute stent closure.  He may have some microvascular dysfunction or coronary vasospasm associated with his PCI.  Question if there may also be an element of musculoskeletal pain or pericarditis.  Given that he is now pain-free, we will defer further intervention at this time.  If he has recurrent pain, addition of a long-acting nitrate or nitroglycerin infusion could be considered.  As he remains he dynamically stable, I have a very low suspicion of a coronary perforation.  There was no evidence of any contrast extravasation at the Marcus [name] of the PCI.  If he were to become unstable, emergent echocardiogram will need to be performed.  Continue DAPT with aspirin and clopidogrel as well as rosuvastatin for secondary prevention.  Given history of cocaine use in the past, I will check a urine drug screen.  Carotid artery stenosis: No focal neurologic deficits.  Continue close monitoring with plans for carotid stenting next week with vascular surgery.  For questions or updates, please contact Marcus [name] Please consult [email or web address] for contact info under [name].     Signed, Marcus Bush, MD  08/26/2022, 5:36 PM

## 2022-08-26 NOTE — Brief Op Note (Addendum)
BRIEF CARDIAC CATHETERIZATION NOTE  08/26/2022  9:37 AM  PATIENT:  Marcus [name]  57 y.o. male  PRE-OPERATIVE DIAGNOSIS: Unstable angina  POST-OPERATIVE DIAGNOSIS:  Same  PROCEDURE:  Procedure(s): LEFT HEART CATH AND CORONARY ANGIOGRAPHY (Left) CORONARY STENT INTERVENTION (N/A)  SURGEON:  Surgeon(s) and Role:    * Tor Tsuda, MD - Primary  FINDINGS: Severe single vessel CAD with 50-60% ISR of ramus stent and 90% de-novo lesion just distal to the stent. Non-obstructive LAD, LCx, and RCA disease. Mildly elevated LVEDP. Successful PCI and intracoronary lithotripsy to ramus intermedius with reduction of ISR to 20-30% and reduction of de-novo lesion to 0% with TIMI-3 flow.  RECOMMENDATIONS: Overnight observation. DAPT with ASA and clopidogrel for at least 12 months. Aggressive secondary prevention of CAD.  Nelva Bush, MD [facility]

## 2022-08-26 NOTE — Plan of Care (Signed)

## 2022-08-27 ENCOUNTER — Encounter: Payer: Self-pay | Admitting: Internal Medicine

## 2022-08-27 DIAGNOSIS — I200 Unstable angina: Secondary | ICD-10-CM

## 2022-08-27 DIAGNOSIS — E782 Mixed hyperlipidemia: Secondary | ICD-10-CM

## 2022-08-27 DIAGNOSIS — F1410 Cocaine abuse, uncomplicated: Secondary | ICD-10-CM | POA: Diagnosis not present

## 2022-08-27 DIAGNOSIS — E785 Hyperlipidemia, unspecified: Secondary | ICD-10-CM | POA: Insufficient documentation

## 2022-08-27 DIAGNOSIS — I25110 Atherosclerotic heart disease of native coronary artery with unstable angina pectoris: Secondary | ICD-10-CM | POA: Diagnosis not present

## 2022-08-27 DIAGNOSIS — I6521 Occlusion and stenosis of right carotid artery: Secondary | ICD-10-CM | POA: Diagnosis not present

## 2022-08-27 DIAGNOSIS — I255 Ischemic cardiomyopathy: Secondary | ICD-10-CM

## 2022-08-27 DIAGNOSIS — I2510 Atherosclerotic heart disease of native coronary artery without angina pectoris: Secondary | ICD-10-CM | POA: Insufficient documentation

## 2022-08-27 LAB — BASIC METABOLIC PANEL
Anion gap: 6 (ref 5–15)
BUN: 14 mg/dL (ref 6–20)
CO2: 23 mmol/L (ref 22–32)
Calcium: 8.6 mg/dL — ABNORMAL LOW (ref 8.9–10.3)
Chloride: 105 mmol/L (ref 98–111)
Creatinine, Ser: 1.08 mg/dL (ref 0.61–1.24)
GFR, Estimated: >60 mL/min (ref >60)
Glucose, Bld: 90 mg/dL (ref 70–99)
Potassium: 3.8 mmol/L (ref 3.5–5.1)
Sodium: 134 mmol/L — ABNORMAL LOW (ref 135–145)

## 2022-08-27 LAB — CBC
HCT: 37.6 % — ABNORMAL LOW (ref 39.0–52.0)
Hemoglobin: 12.9 g/dL — ABNORMAL LOW (ref 13.0–17.0)
MCH: 30.7 pg (ref 26.0–34.0)
MCHC: 34.3 g/dL (ref 30.0–36.0)
MCV: 89.5 fL (ref 80.0–100.0)
Platelets: 235 10*3/uL (ref 150–400)
RBC: 4.2 MIL/uL — ABNORMAL LOW (ref 4.22–5.81)
RDW: 13.6 % (ref 11.5–15.5)
WBC: 6.9 10*3/uL (ref 4.0–10.5)
nRBC: 0 % (ref 0.0–0.2)

## 2022-08-27 MED ORDER — ISOSORBIDE MONONITRATE ER 30 MG PO TB24: 30.0000 mg | ORAL_TABLET | Freq: Every day | ORAL | Status: DC

## 2022-08-27 MED ORDER — ISOSORBIDE MONONITRATE ER 30 MG PO TB24: 30.0000 mg | ORAL_TABLET | Freq: Every day | ORAL | 1 refills | Status: DC

## 2022-08-27 MED ORDER — IRBESARTAN 150 MG PO TABS: 150.0000 mg | ORAL_TABLET | Freq: Every day | ORAL | Status: DC

## 2022-08-27 MED ORDER — IRBESARTAN 150 MG PO TABS: 150.0000 mg | ORAL_TABLET | Freq: Every day | ORAL | 3 refills | Status: DC

## 2022-08-27 NOTE — Discharge Summary (Signed)
Discharge Summary    Patient ID: Marcus [name] MRN: [id number]; DOB: Oct 21, 1965  Admit date: 08/26/2022 Discharge date: 08/27/2022  PCP:  Dianna Rossetti, NP   [place] Providers Cardiologist:  Ida Rogue, MD   {   Discharge Diagnoses    Principal Problem:   Unstable angina [facility]) Active Problems:   Coronary artery disease   Hypertension   Ischemic cardiomyopathy   Cocaine abuse ([place])   Carotid stenosis   Hyperlipidemia   Diagnostic Studies/Procedures   Cardiac cath 08/26/22 Conclusions: Severe single vessel coronary artery disease with 50-60% in-stent restenosis of ramus stent and 90% de-novo lesion just distal to the stent. Mild-moderate, noncritical disease involving the LAD, LCx, and RCA.  There is a 70% stenosis in the apical LAD, which is too small/distal for percutaneous intervention Mildly elevated left ventricular for pressure (LVEDP 19 mmHg). Successful PCI and intracoronary lithotripsy to ramus intermedius with reduction of in-stent restenosis to 20-30% and reduction of de-novo lesion to 0% with TIMI-3 flow.   Recommendations: Overnight observation. Dual antiplatelet therapy with aspirin and clopidogrel for at least 12 months. Aggressive secondary prevention of coronary artery disease.   Nelva Bush, MD [facility] _____________   History of Present Illness     Marcus [name] is a 57 y.o. male with h/o CAD with stenting RI in 2015, HFrEF 2/2 ICM, polysubstance use including ETOH, tobacco use, cocaine use, HTN, and HLD who was admitted post cardiac catheterization.   The patient was seen 08/12/22 reporting continued left sided chest pain. He also noted dizziness and syncope. The patient was set up for [facility] for UA. An echo and Carotid US were ordered.   Carotid US showed 60-79% stenosis in the right internal carotid artery with 80-90% int he left internal carotid artery and left subclavian artery stenosis. STAT CTA head and neck were  ordered and the patient was referred to [facility]. STATE CTA head showed severe focal stenosis 80% at the pRCA, and left ICA with 50% stenosis. The patient saw [facility] 08/18/22 and the patient was scheduled for carotid stenting and started on Plavix.   The patient underwent scheduled cardiac cath 08/26/22 showing severe single vessel CAD with 50-60^ ISR of RI stent and 90% de novo lesion just distal to the stent, mild to moderate non-critical disease, mildly elevated LVEDP 18mHG. The patient was treated with successful PCI and intracoronary lithotripsy to ramus intermedius with reduction of ISR to 20-30% and reduction of de-novo lesion to 0%.   The patient had post-cath chest pain and was given SL NTG x 2 with resolution of pain. EKG showed no changes. He also reported chest pain during the procedure. The patient was kept overnight for observation.   Hospital Course     Consultants: None   Unstable angina with post PCI chest pain CAD with remote stenting - scheduled outpatient cath showed severe single vessel CAD with 50-60% ISR of ramus stent and 90% de-novo lesion distal to the stent, otherwise non-critical disease. Patient was treated with PCI and intracoronary lithotripsy to RI with reductio of ISR to 20-30% and reduction of de-novo - chest pain began during intervention that increased during lithotripsy. Resolved with SL NTG x2. IT was suspected the chest pain is from microvascular dysfunction or coronary vasospasm associated with PCI. The patient was kept overnight for observation. - continue DAPT with ASA and Plavix for at least 12 months - continue Crestor 424mdaily - we will start Imdur 3048maily - continue SL  NTG and BB therapy - cath site remains stable - patient ambulated without chest pain - patient will need cardiac rehab referral as outpatient  Carotid artery stenosis - plan for right carotid stenting next week with [facility] - 50% left carotid artery - continue ASA, Plavix and statin  therapy  HTN - stop HCTZ and Irbesartan was decreased to 173m daily - start Imdur 336mdaily - continue Toprol 5043maily  Cocaine use - UDS ordered  GEN: No acute distress.   Neck: No JVD Cardiac: RRR, no murmurs, rubs, or gallops.  Respiratory: Clear to auscultation bilaterally. GI: Soft, nontender, non-distended  MS: No edema; No deformity. Neuro:  Nonfocal  Psych: Normal affect      Did the patient have an acute coronary syndrome (MI, NSTEMI, STEMI, etc) this admission?:  No                               Did the patient have a percutaneous coronary intervention (stent / angioplasty)?:  Yes.     Cath/PCI Registry Performance & Quality Measures: Aspirin prescribed? - Yes ADP Receptor Inhibitor (Plavix/Clopidogrel, Brilinta/Ticagrelor or Effient/Prasugrel) prescribed (includes medically managed patients)? - Yes High Intensity Statin (Lipitor 40-68m103m Crestor 20-40mg41mescribed? - Yes For EF <40%, was ACEI/ARB prescribed? - Yes For EF <40%, Aldosterone Antagonist (Spironolactone or Eplerenone) prescribed? - Yes Cardiac Rehab Phase II ordered? - Yes       The patient will be scheduled for a TOC follow up appointment in 14 days.  A message [facility] the office where the patient should be seen for follow up.  _____________  Discharge Vitals Blood pressure 132/74, pulse 68, temperature 97.6 F (36.4 C), temperature source Oral, resp. rate 18, height 5' 3"$  (1.6 m), weight 74.1 kg, SpO2 100 %.  Filed Weights   08/26/22 0656 08/27/22 0500  Weight: 77.1 kg 74.1 kg    Labs & Radiologic Studies    CBC Recent Labs    08/27/22 0230  WBC 6.9  HGB 12.9*  HCT 37.6*  MCV 89.5  PLT 235  [id number] Metabolic Panel Recent Labs    08/27/22 0230  NA 134*  K 3.8  CL 105  CO2 23  GLUCOSE 90  BUN 14  CREATININE 1.08  CALCIUM 8.6*   Liver Function Tests No results for input(s): "AST", "ALT", "ALKPHOS", "BILITOT", "PROT",  "ALBUMIN" in the last 72 hours. No results for input(s): "LIPASE", "AMYLASE" in the last 72 hours. High Sensitivity Troponin:   No results for input(s): "TROPONINIHS" in the last 720 hours.  BNP Invalid input(s): "POCBNP" D-Dimer No results for input(s): "DDIMER" in the last 72 hours. Hemoglobin A1C No results for input(s): "HGBA1C" in the last 72 hours. Fasting Lipid Panel No results for input(s): "CHOL", "HDL", "LDLCALC", "TRIG", "CHOLHDL", "LDLDIRECT" in the last 72 hours. Thyroid Function Tests No results for input(s): "TSH", "T4TOTAL", "T3FREE", "THYROIDAB" in the last 72 hours.  Invalid input(s): "FREET3" _____________  CARDIAC CATHETERIZATION  Result Date: 08/26/2022 Conclusions: Severe single vessel coronary artery disease with 50-60% in-stent restenosis of ramus stent and 90% de-novo lesion just distal to the stent. Mild-moderate, noncritical disease involving the LAD, LCx, and RCA.  There is a 70% stenosis in the apical LAD, which is too small/distal for percutaneous intervention Mildly elevated left ventricular for pressure (LVEDP 19 mmHg). Successful PCI and intracoronary lithotripsy to ramus intermedius with reduction of in-stent  restenosis to 20-30% and reduction of de-novo lesion to 0% with TIMI-3 flow.  Recommendations: Overnight observation. Dual antiplatelet therapy with aspirin and clopidogrel for at least 12 months. Aggressive secondary prevention of coronary artery disease. Nelva Bush, MD [facility]  ECHOCARDIOGRAM COMPLETE  Result Date: 08/17/2022    ECHOCARDIOGRAM REPORT   Patient Name:   HILLARY ABARCA Date of Exam: 08/17/2022 Medical Rec #:  [id number]        Height:       63.0 in Accession #:    [id number]       Weight:       167.0 lb Date of Birth:  06-29-1966        BSA:          1.791 m Patient Age:    59 years         BP:           152/86 mmHg Patient Gender: M                HR:           62 bpm. Exam Location:  [facility] Procedure: 2D Echo, Cardiac  Doppler, Color Doppler and Strain Analysis Indications:    R55 Syncope; I50.20* Unspecified systolic (congestive) heart                 failure  History:        Patient has no prior history of Echocardiogram examinations.                 CHF, CAD, Signs/Symptoms:Syncope; Risk Factors:Hypertension and                 Current Smoker.  Sonographer:    Pilar Jarvis RDMS, RVT, RDCS Referring Phys: [id number] [place]  1. Left ventricular ejection fraction, by estimation, is 55 to 60%. The left ventricle has normal function. The left ventricle has no regional wall motion abnormalities. Left ventricular diastolic parameters are consistent with Grade II diastolic dysfunction (pseudonormalization). The average left ventricular global longitudinal strain is -19.5 %. The global longitudinal strain is normal.  2. Right ventricular systolic function is normal. The right ventricular size is normal.  3. The mitral valve is normal in structure. Mild mitral valve regurgitation.  4. The aortic valve is tricuspid. Aortic valve regurgitation is not visualized. FINDINGS  Left Ventricle: Left ventricular ejection fraction, by estimation, is 55 to 60%. The left ventricle has normal function. The left ventricle has no regional wall motion abnormalities. The average left ventricular global longitudinal strain is -19.5 %. The global longitudinal strain is normal. The left ventricular internal cavity size was normal in size. There is no left ventricular hypertrophy. Left ventricular diastolic parameters are consistent with Grade II diastolic dysfunction (pseudonormalization). Right Ventricle: The right ventricular size is normal. No increase in right ventricular wall thickness. Right ventricular systolic function is normal. Left Atrium: Left atrial size was normal in size. Right Atrium: Right atrial size was normal in size. Pericardium: There is no evidence of pericardial effusion. Mitral Valve: The mitral valve is normal in  structure. Mild mitral valve regurgitation. Tricuspid Valve: The tricuspid valve is normal in structure. Tricuspid valve regurgitation is trivial. Aortic Valve: The aortic valve is tricuspid. Aortic valve regurgitation is not visualized. Aortic valve mean gradient measures 3.0 mmHg. Aortic valve peak gradient measures 6.9 mmHg. Aortic valve area, by VTI measures 2.59 cm. Pulmonic Valve: The pulmonic valve was normal in structure. Pulmonic valve regurgitation is not  visualized. Aorta: The aortic root and ascending aorta are structurally normal, with no evidence of dilitation. Venous: The inferior vena cava was not well visualized. IAS/Shunts: No atrial level shunt detected by color flow Doppler.  LEFT VENTRICLE PLAX 2D LVIDd:         4.90 cm     Diastology LVIDs:         3.00 cm     LV e' medial:    6.09 cm/s LV PW:         0.70 cm     LV E/e' medial:  20.9 LV IVS:        0.80 cm     LV e' lateral:   7.07 cm/s LVOT diam:     2.00 cm     LV E/e' lateral: 18.0 LV SV:         71 LV SV Index:   39          2D Longitudinal Strain LVOT Area:     3.14 cm    2D Strain GLS Avg:     -19.5 %  LV Volumes (MOD) LV vol d, MOD A2C: 83.6 ml LV vol d, MOD A4C: 93.8 ml LV vol s, MOD A2C: 39.5 ml LV vol s, MOD A4C: 41.8 ml LV SV MOD A2C:     44.1 ml LV SV MOD A4C:     93.8 ml LV SV MOD BP:      49.0 ml RIGHT VENTRICLE RV Basal diam:  2.90 cm RV S prime:     14.50 cm/s TAPSE (M-mode): 2.8 cm LEFT ATRIUM             Index        RIGHT ATRIUM           Index LA diam:        4.00 cm 2.23 cm/m   RA Area:     10.60 cm LA Vol (A2C):   35.3 ml 19.71 ml/m  RA Volume:   23.20 ml  12.95 ml/m LA Vol (A4C):   39.7 ml 22.17 ml/m LA Biplane Vol: 37.5 ml 20.94 ml/m  AORTIC VALVE                    PULMONIC VALVE AV Area (Vmax):    2.38 cm     PV Vmax:       1.15 m/s AV Area (Vmean):   2.52 cm     PV Peak grad:  5.3 mmHg AV Area (VTI):     2.59 cm AV Vmax:           131.00 cm/s AV Vmean:          84.900 cm/s AV VTI:            0.273 m AV  Peak Grad:      6.9 mmHg AV Mean Grad:      3.0 mmHg LVOT Vmax:         99.20 cm/s LVOT Vmean:        68.100 cm/s LVOT VTI:          0.225 m LVOT/AV VTI ratio: 0.82  AORTA Ao Root diam: 3.00 cm Ao Asc diam:  2.60 cm MITRAL VALVE MV Area (PHT): 3.87 cm     SHUNTS MV Decel Time: 196 msec     Systemic VTI:  0.22 m MV E velocity: 127.00 cm/s  Systemic Diam: 2.00 cm MV A velocity: 118.00 cm/s MV E/A ratio:  1.08 Aaron Edelman  Agbor-Etang MD Electronically signed by Kate Sable MD Signature Date/Time: 08/17/2022/4:39:12 PM    Final    CT ANGIO NECK W OR WO CONTRAST  Result Date: 08/17/2022 CLINICAL DATA:  Blacking out EXAM: CT ANGIOGRAPHY NECK TECHNIQUE: Multidetector CT imaging of the neck was performed using the standard protocol during bolus administration of intravenous contrast. Multiplanar CT image reconstructions and MIPs were obtained to evaluate the vascular anatomy. Carotid stenosis measurements (when applicable) are obtained utilizing NASCET criteria, using the distal internal carotid diameter as the denominator. RADIATION DOSE REDUCTION: This exam was performed according to the departmental dose-optimization program which includes automated exposure control, adjustment of the mA and/or kV according to patient size and/or use of iterative reconstruction technique. CONTRAST:  76m OMNIPAQUE IOHEXOL 350 MG/ML SOLN COMPARISON:  None Available. FINDINGS: Aortic arch: Standard branching. Imaged portion shows no evidence of aneurysm or dissection. No significant stenosis of the major arch vessel origins. Mild narrowing of the origin of the left subclavian artery. Right carotid system: Severe focal stenosis (approximately 80% in the proximal right ICA (series 6, image 170) Left carotid system: Approximately 50% stenosis of the proximal left ICA (series 6, image 160). Vertebral arteries: The left vertebral artery is diffusely small in caliber. There may be severe focal stenosis of the origin of the left vertebral  artery (series 6, image 76). Moderate focal stenosis of the mid V2 segment of the right vertebral artery (series 6, image 130). The V1 segment of the right vertebral artery is poorly assessed due to streak artifact from venous contrast material. Skeleton: Negative straightening of normal cervical lordosis. Other neck: Mild soft tissue stranding over the midline suboccipital neck (series 5, image 98). Mucosal thickening bilateral ethmoid and left maxillary sinus. Carious dentition multiple periapical lucencies. Upper chest: Biapical bronchial wall thickening, nonspecific, but be seen in the setting of bronchitis. IMPRESSION: 1. Severe focal stenosis (approximately 80%) at the proximal right ICA. 2. Approximately 50% stenosis of the proximal left ICA. 3. The left vertebral artery is diffusely small in caliber with possible severe focal stenosis at the origin of the left vertebral artery. Electronically Signed   By: HMarin RobertsM.D.   On: 08/17/2022 12:40   VAS [place]  Result Date: 08/17/2022 Carotid Arterial Duplex Study Patient Name:  SJAZEN MCKINNON Date of Exam:   08/17/2022 Medical Rec #: [id number]        Accession #:    [id number] of Birth: 625-Jan-1967        Patient Gender: M Patient Age:   89years Exam Location:  [place] Procedure:      VAS [place] Referring Phys: RChristell Faith--------------------------------------------------------------------------------  Indications:                            Syncope and Multiple syncopal episodes                                         over the last several weeks. The last                                         one was 3 weeks ago.. Denies all other  cerebrovascular symptoms. Risk Factors:                           Hypertension, current smoker, coronary                                         artery disease. Comparison Study:                       No previous on record Pre-Surgical Evaluation & Surgical       Stenosis at RICA only. ICA is normal Correlation                             past the stenosis. Anatomy on the right                                         is within normal limits.Right                                         bifurcation is located near the Hyoid                                         Notch. Stenosis is >2 cm distal to the                                         LICA origin.ICA is not normal past                                         stenosis. Anatomy on the left is within                                         normal limits.Left bifurcation is                                         located near the Hyoid Notch. Performing Technologist: Pilar Jarvis RDMS, RVT, RDCS  Examination Guidelines: A complete evaluation includes B-mode imaging, spectral Doppler, color Doppler, and power Doppler as needed of all accessible portions of each vessel. Bilateral testing is considered an integral part of a complete examination. Limited examinations for reoccurring indications may be performed as noted.  Right Carotid Findings: +----------+--------+--------+--------+---------------------+------------------+           PSV cm/sEDV cm/sStenosisPlaque Description   Comments           +----------+--------+--------+--------+---------------------+------------------+ CCA Prox  88      15                                                      +----------+--------+--------+--------+---------------------+------------------+  CCA Distal56      14      <50%    smooth, homogeneous                                                       and diffuse                             +----------+--------+--------+--------+---------------------+------------------+ ICA Prox  224     74      60-79%  homogeneous and      Scant flow through                                   smooth               the stenosis       +----------+--------+--------+--------+---------------------+------------------+ ICA Mid    86      27      1-39%   homogeneous and                                                           smooth                                  +----------+--------+--------+--------+---------------------+------------------+ ICA Distal130     29                                                      +----------+--------+--------+--------+---------------------+------------------+ ECA       386     51      >50%    smooth and                                                                homogeneous                             +----------+--------+--------+--------+---------------------+------------------+ +----------+--------+-------+----------------+-------------------+           PSV cm/sEDV cmsDescribe        Arm Pressure (mmHG) +----------+--------+-------+----------------+-------------------+ [id number]            Multiphasic, [id number]                 +----------+--------+-------+----------------+-------------------+ +---------+--------+--------+--------------+ VertebralPSV cm/sEDV cm/sNot identified +---------+--------+--------+--------------+ Pre-stenosis 187/74 cm/s Post-stenosis 86/27 cm/s Left Carotid Findings: +----------+--------+--------+--------+---------------------+------------------+           PSV cm/sEDV cm/sStenosisPlaque Description   Comments           +----------+--------+--------+--------+---------------------+------------------+ CCA Prox  159     29                                                      +----------+--------+--------+--------+---------------------+------------------+  CCA Distal80      20      <50%    smooth and                                                                heterogenous                            +----------+--------+--------+--------+---------------------+------------------+ ICA Prox  389     120     80-99%  diffuse, homogeneous                                                       and smooth                              +----------+--------+--------+--------+---------------------+------------------+ ICA Mid   279     60      40-59%  smooth and           high end of 40-59%                                   homogeneous                             +----------+--------+--------+--------+---------------------+------------------+ ICA Distal174     27                                                      +----------+--------+--------+--------+---------------------+------------------+ ECA       270     51      >50%    smooth and                                                                homogeneous                             +----------+--------+--------+--------+---------------------+------------------+ +----------+--------+--------+--------+-------------------+           PSV cm/sEDV cm/sDescribeArm Pressure (mmHG) +----------+--------+--------+--------+-------------------+ Subclavian300             Stenotic152                 +----------+--------+--------+--------+-------------------+ +---------+--------+--+--------+-+---------------------------------+ VertebralPSV cm/s45EDV cm/s8Scant flow via color flow Doppler +---------+--------+--+--------+-+---------------------------------+ Left BP 152/86 mmHg Pre-stenosis 166/46 cm/s Post-stenosis 283/29 cm/s spectral broadening  Summary: Right Carotid: Velocities in the right ICA are consistent with a 60-79%                stenosis. Non-hemodynamically significant plaque <50% noted in  the CCA. The ECA appears >50% stenosed. Left Carotid: Velocities in the left ICA are consistent with a 80-99% stenosis.               Non-hemodynamically significant plaque <50% noted in the CCA. The               ECA appears >50% stenosed. Vertebrals:  Left vertebral artery demonstrates antegrade flow. Right vertebral              artery was not visualized. Scant flow in left vertebral artery.  Subclavians: Left subclavian artery was stenotic. Normal flow hemodynamics were              seen in the right subclavian artery. *See table(s) above for measurements and observations.  Suggest Peripheral Vascular Consult. Electronically signed by Carlyle Dolly MD on 08/17/2022 at 11:51:47 AM.    Final    Disposition   Pt is being discharged home today in good condition.  Follow-up Plans & Appointments     Discharge Instructions     AMB Referral to Cardiac Rehabilitation - Phase II   Complete by: As directed    Diagnosis: Coronary Stents   After initial evaluation and assessments completed: Virtual Based Care may be provided alone or in conjunction with Phase 2 Cardiac Rehab based on patient barriers.: Yes   Intensive Cardiac Rehabilitation (ICR) [place] location only OR Traditional Cardiac Rehabilitation (TCR) *If criteria for ICR are not met will enroll in TCR [facility] only): Yes        Discharge Medications   Allergies as of 08/27/2022       Reactions   Oxycodone-acetaminophen Other (See Comments)   Codeine Rash        Medication List     STOP taking these medications    olmesartan-hydrochlorothiazide 40-12.5 MG tablet Commonly known as: BENICAR HCT       TAKE these medications    aspirin 81 MG tablet Take 81 mg by mouth daily.   clopidogrel 75 MG tablet Commonly known as: PLAVIX Take 1 tablet (75 mg total) by mouth daily.   DULoxetine 30 MG capsule Commonly known as: CYMBALTA Take 30 mg by mouth daily.   fluticasone-salmeterol 250-50 MCG/ACT Aepb Commonly known as: Advair Diskus Inhale 1 puff into the lungs in the morning and at bedtime.   hydrOXYzine 25 MG capsule Commonly known as: VISTARIL Take 25 mg by mouth 2 (two) times daily.   irbesartan 150 MG tablet Commonly known as: AVAPRO Take 1 tablet (150 mg total) by mouth daily. Start taking on: August 28, 2022   isosorbide mononitrate 30 MG 24 hr tablet Commonly known as: IMDUR Take 1 tablet (30 mg  total) by mouth daily. Start taking on: August 28, 2022   metoprolol succinate 50 MG 24 hr tablet Commonly known as: TOPROL-XL Take 1 tablet (50 mg total) by mouth daily. Take with or immediately following a meal.   nitroGLYCERIN 0.4 MG SL tablet Commonly known as: NITROSTAT SMARTSIG:1 Sublingual Daily   pantoprazole 40 MG tablet Commonly known as: PROTONIX Take 40 mg by mouth daily.   QUEtiapine 50 MG Tb24 24 hr tablet Commonly known as: SEROQUEL XR Take 50 mg by mouth daily. Take 2 tablets by mouth daily at bedtime   rosuvastatin 40 MG tablet Commonly known as: CRESTOR Take 40 mg by mouth daily.   Ventolin HFA 108 (90 Base) MCG/ACT inhaler Generic drug: albuterol SMARTSIG:2 Puff(s) By Mouth Every 4 Hours PRN  Outstanding Labs/Studies   N/A  Duration of Discharge Encounter   Greater than 30 minutes including physician time.  Signed, Nahum Sherrer Ninfa Meeker, PA-C 08/27/2022, 12:31 PM

## 2022-08-27 NOTE — Plan of Care (Signed)

## 2022-08-29 LAB — LIPOPROTEIN A (LPA): Lipoprotein (a): 27.1 nmol/L (ref <75.0)

## 2022-08-31 ENCOUNTER — Encounter
Admission: RE | Disposition: A | Discharge status: Home / Self Care | Payer: Self-pay | Source: Home / Self Care | Attending: Vascular Surgery

## 2022-08-31 ENCOUNTER — Inpatient Hospital Stay
Admission: RE | Admit: 2022-08-31 | Discharge: 2022-09-01 | DRG: 036 | Disposition: A | Discharge status: Home / Self Care | Payer: Medicaid Other | Attending: Vascular Surgery | Admitting: Vascular Surgery

## 2022-08-31 DIAGNOSIS — Z955 Presence of coronary angioplasty implant and graft: Secondary | ICD-10-CM | POA: Diagnosis not present

## 2022-08-31 DIAGNOSIS — Z8719 Personal history of other diseases of the digestive system: Secondary | ICD-10-CM | POA: Diagnosis not present

## 2022-08-31 DIAGNOSIS — I10 Essential (primary) hypertension: Secondary | ICD-10-CM | POA: Diagnosis present

## 2022-08-31 DIAGNOSIS — Z823 Family history of stroke: Secondary | ICD-10-CM

## 2022-08-31 DIAGNOSIS — Z7982 Long term (current) use of aspirin: Secondary | ICD-10-CM | POA: Diagnosis not present

## 2022-08-31 DIAGNOSIS — Z8249 Family history of ischemic heart disease and other diseases of the circulatory system: Secondary | ICD-10-CM | POA: Diagnosis not present

## 2022-08-31 DIAGNOSIS — R519 Headache, unspecified: Secondary | ICD-10-CM | POA: Diagnosis not present

## 2022-08-31 DIAGNOSIS — Z8711 Personal history of peptic ulcer disease: Secondary | ICD-10-CM | POA: Diagnosis not present

## 2022-08-31 DIAGNOSIS — Z885 Allergy status to narcotic agent status: Secondary | ICD-10-CM

## 2022-08-31 DIAGNOSIS — I2510 Atherosclerotic heart disease of native coronary artery without angina pectoris: Secondary | ICD-10-CM | POA: Diagnosis present

## 2022-08-31 DIAGNOSIS — F17210 Nicotine dependence, cigarettes, uncomplicated: Secondary | ICD-10-CM | POA: Diagnosis present

## 2022-08-31 DIAGNOSIS — I252 Old myocardial infarction: Secondary | ICD-10-CM | POA: Diagnosis not present

## 2022-08-31 DIAGNOSIS — I6523 Occlusion and stenosis of bilateral carotid arteries: Secondary | ICD-10-CM | POA: Diagnosis present

## 2022-08-31 DIAGNOSIS — I255 Ischemic cardiomyopathy: Secondary | ICD-10-CM | POA: Diagnosis present

## 2022-08-31 DIAGNOSIS — Q2549 Other congenital malformations of aorta: Secondary | ICD-10-CM

## 2022-08-31 DIAGNOSIS — I6521 Occlusion and stenosis of right carotid artery: Principal | ICD-10-CM | POA: Diagnosis present

## 2022-08-31 DIAGNOSIS — Z79899 Other long term (current) drug therapy: Secondary | ICD-10-CM | POA: Diagnosis not present

## 2022-08-31 DIAGNOSIS — F32A Depression, unspecified: Secondary | ICD-10-CM | POA: Diagnosis present

## 2022-08-31 DIAGNOSIS — Z7951 Long term (current) use of inhaled steroids: Secondary | ICD-10-CM

## 2022-08-31 DIAGNOSIS — I6502 Occlusion and stenosis of left vertebral artery: Secondary | ICD-10-CM | POA: Diagnosis present

## 2022-08-31 DIAGNOSIS — Z8042 Family history of malignant neoplasm of prostate: Secondary | ICD-10-CM | POA: Diagnosis not present

## 2022-08-31 HISTORY — PX: CAROTID PTA/STENT INTERVENTION: CATH118231

## 2022-08-31 LAB — BUN: BUN: 17 mg/dL (ref 6–20)

## 2022-08-31 LAB — CREATININE, SERUM
Creatinine, Ser: 1.08 mg/dL (ref 0.61–1.24)
GFR, Estimated: >60 mL/min (ref >60)

## 2022-08-31 LAB — GLUCOSE, CAPILLARY: Glucose-Capillary: 104 mg/dL — ABNORMAL HIGH (ref 70–99)

## 2022-08-31 LAB — MRSA NEXT GEN BY PCR, NASAL: MRSA by PCR Next Gen: NOT DETECTED

## 2022-08-31 SURGERY — CAROTID PTA/STENT INTERVENTION
Anesthesia: Moderate Sedation | Laterality: Right

## 2022-08-31 MED ORDER — HYDROXYZINE HCL 25 MG PO TABS
25.0000 mg | ORAL_TABLET | Freq: Two times a day (BID) | ORAL | Status: DC
  Administered 2022-08-31 – 2022-09-01 (×3): 25 mg via ORAL
  Filled 2022-08-31 (×3): qty 1

## 2022-08-31 MED ORDER — CEFAZOLIN SODIUM-DEXTROSE 1-4 GM/50ML-% IV SOLN
INTRAVENOUS | Status: DC | PRN
  Administered 2022-08-31: 2 g via INTRAVENOUS

## 2022-08-31 MED ORDER — HEPARIN SODIUM (PORCINE) 1000 UNIT/ML IJ SOLN
INTRAMUSCULAR | Status: AC
  Filled 2022-08-31: qty 10

## 2022-08-31 MED ORDER — ONDANSETRON HCL 4 MG/2ML IJ SOLN: 4.0000 mg | Freq: Four times a day (QID) | INTRAMUSCULAR | Status: DC | PRN

## 2022-08-31 MED ORDER — GUAIFENESIN-DM 100-10 MG/5ML PO SYRP: 15.0000 mL | ORAL_SOLUTION | ORAL | Status: DC | PRN

## 2022-08-31 MED ORDER — FENTANYL CITRATE (PF) 100 MCG/2ML IJ SOLN
INTRAMUSCULAR | Status: AC
  Filled 2022-08-31: qty 2

## 2022-08-31 MED ORDER — ATROPINE SULFATE 1 MG/10ML IJ SOSY
PREFILLED_SYRINGE | INTRAMUSCULAR | Status: DC | PRN
  Administered 2022-08-31: 1 mg via INTRAVENOUS

## 2022-08-31 MED ORDER — CEFAZOLIN SODIUM-DEXTROSE 2-4 GM/100ML-% IV SOLN: 2.0000 g | INTRAVENOUS | Status: DC

## 2022-08-31 MED ORDER — MOMETASONE FURO-FORMOTEROL FUM 200-5 MCG/ACT IN AERO
2.0000 | INHALATION_SPRAY | Freq: Two times a day (BID) | RESPIRATORY_TRACT | Status: DC
  Administered 2022-08-31 – 2022-09-01 (×2): 2 via RESPIRATORY_TRACT
  Filled 2022-08-31: qty 8.8

## 2022-08-31 MED ORDER — ASPIRIN 81 MG PO CHEW
81.0000 mg | CHEWABLE_TABLET | Freq: Every day | ORAL | Status: DC
  Administered 2022-09-01: 81 mg via ORAL
  Filled 2022-08-31: qty 1

## 2022-08-31 MED ORDER — MIDAZOLAM HCL 2 MG/ML PO SYRP: 8.0000 mg | ORAL_SOLUTION | Freq: Once | ORAL | Status: DC | PRN

## 2022-08-31 MED ORDER — ACETAMINOPHEN 325 MG RE SUPP: 325.0000 mg | RECTAL | Status: DC | PRN

## 2022-08-31 MED ORDER — DOPAMINE-DEXTROSE 3.2-5 MG/ML-% IV SOLN
INTRAVENOUS | Status: AC
  Filled 2022-08-31: qty 250

## 2022-08-31 MED ORDER — CLOPIDOGREL BISULFATE 75 MG PO TABS
75.0000 mg | ORAL_TABLET | Freq: Every day | ORAL | Status: DC
  Administered 2022-09-01: 75 mg via ORAL
  Filled 2022-08-31: qty 1

## 2022-08-31 MED ORDER — MORPHINE SULFATE (PF) 4 MG/ML IV SOLN
2.0000 mg | INTRAVENOUS | Status: DC | PRN
  Administered 2022-08-31: 2 mg via INTRAVENOUS
  Administered 2022-09-01 (×2): 4 mg via INTRAVENOUS
  Administered 2022-09-01: 5 mg via INTRAVENOUS
  Filled 2022-08-31 (×2): qty 1
  Filled 2022-08-31: qty 2
  Filled 2022-08-31: qty 1

## 2022-08-31 MED ORDER — QUETIAPINE FUMARATE ER 50 MG PO TB24
100.0000 mg | ORAL_TABLET | Freq: Every day | ORAL | Status: DC
  Administered 2022-08-31: 100 mg via ORAL
  Filled 2022-08-31: qty 2

## 2022-08-31 MED ORDER — METHYLPREDNISOLONE SODIUM SUCC 125 MG IJ SOLR: 125.0000 mg | Freq: Once | INTRAMUSCULAR | Status: DC | PRN

## 2022-08-31 MED ORDER — CEFAZOLIN SODIUM-DEXTROSE 2-4 GM/100ML-% IV SOLN
2.0000 g | Freq: Three times a day (TID) | INTRAVENOUS | Status: DC
  Filled 2022-08-31: qty 100

## 2022-08-31 MED ORDER — SODIUM CHLORIDE 0.9 % IV SOLN: 500.0000 mL | Freq: Once | INTRAVENOUS | Status: DC | PRN

## 2022-08-31 MED ORDER — PHENOL 1.4 % MT LIQD: 1.0000 | OROMUCOSAL | Status: DC | PRN

## 2022-08-31 MED ORDER — ORAL CARE MOUTH RINSE: 15.0000 mL | OROMUCOSAL | Status: DC | PRN

## 2022-08-31 MED ORDER — HYDROMORPHONE HCL 1 MG/ML IJ SOLN
1.0000 mg | Freq: Once | INTRAMUSCULAR | Status: AC | PRN
  Administered 2022-08-31: 1 mg via INTRAVENOUS
  Filled 2022-08-31: qty 1

## 2022-08-31 MED ORDER — CEFAZOLIN SODIUM-DEXTROSE 2-4 GM/100ML-% IV SOLN
INTRAVENOUS | Status: AC
  Filled 2022-08-31: qty 100

## 2022-08-31 MED ORDER — IODIXANOL 320 MG/ML IV SOLN
INTRAVENOUS | Status: DC | PRN
  Administered 2022-08-31: 50 mL

## 2022-08-31 MED ORDER — PANTOPRAZOLE SODIUM 40 MG PO TBEC
40.0000 mg | DELAYED_RELEASE_TABLET | Freq: Every day | ORAL | Status: DC
  Administered 2022-08-31 – 2022-09-01 (×2): 40 mg via ORAL
  Filled 2022-08-31 (×2): qty 1

## 2022-08-31 MED ORDER — ROSUVASTATIN CALCIUM 10 MG PO TABS
40.0000 mg | ORAL_TABLET | Freq: Every evening | ORAL | Status: DC
  Administered 2022-08-31: 40 mg via ORAL
  Filled 2022-08-31: qty 4

## 2022-08-31 MED ORDER — ATROPINE SULFATE 1 MG/10ML IJ SOSY
PREFILLED_SYRINGE | INTRAMUSCULAR | Status: AC
  Filled 2022-08-31: qty 10

## 2022-08-31 MED ORDER — HYDROCODONE-ACETAMINOPHEN 5-325 MG PO TABS
1.0000 | ORAL_TABLET | Freq: Four times a day (QID) | ORAL | Status: DC | PRN
  Administered 2022-08-31 – 2022-09-01 (×2): 2 via ORAL
  Filled 2022-08-31 (×2): qty 2

## 2022-08-31 MED ORDER — ALBUTEROL SULFATE (2.5 MG/3ML) 0.083% IN NEBU: 3.0000 mL | INHALATION_SOLUTION | RESPIRATORY_TRACT | Status: DC | PRN

## 2022-08-31 MED ORDER — MAGNESIUM SULFATE 2 GM/50ML IV SOLN: 2.0000 g | Freq: Every day | INTRAVENOUS | Status: DC | PRN

## 2022-08-31 MED ORDER — POTASSIUM CHLORIDE CRYS ER 20 MEQ PO TBCR: 20.0000 meq | EXTENDED_RELEASE_TABLET | Freq: Every day | ORAL | Status: DC | PRN

## 2022-08-31 MED ORDER — MIDAZOLAM HCL 5 MG/5ML IJ SOLN
INTRAMUSCULAR | Status: AC
  Filled 2022-08-31: qty 5

## 2022-08-31 MED ORDER — PHENYLEPHRINE 80 MCG/ML (10ML) SYRINGE FOR IV PUSH (FOR BLOOD PRESSURE SUPPORT)
PREFILLED_SYRINGE | INTRAVENOUS | Status: AC
  Filled 2022-08-31: qty 10

## 2022-08-31 MED ORDER — METOPROLOL TARTRATE 5 MG/5ML IV SOLN: 2.0000 mg | INTRAVENOUS | Status: DC | PRN

## 2022-08-31 MED ORDER — ACETAMINOPHEN 325 MG PO TABS: 325.0000 mg | ORAL_TABLET | ORAL | Status: DC | PRN

## 2022-08-31 MED ORDER — SODIUM CHLORIDE 0.9 % IV SOLN: INTRAVENOUS | Status: DC

## 2022-08-31 MED ORDER — MIDAZOLAM HCL 2 MG/2ML IJ SOLN
INTRAMUSCULAR | Status: DC | PRN
  Administered 2022-08-31: 2 mg via INTRAVENOUS
  Administered 2022-08-31 (×2): 1 mg via INTRAVENOUS

## 2022-08-31 MED ORDER — ISOSORBIDE MONONITRATE ER 30 MG PO TB24
30.0000 mg | ORAL_TABLET | Freq: Every day | ORAL | Status: DC
  Administered 2022-08-31 – 2022-09-01 (×2): 30 mg via ORAL
  Filled 2022-08-31 (×2): qty 1

## 2022-08-31 MED ORDER — FAMOTIDINE 20 MG PO TABS: 40.0000 mg | ORAL_TABLET | Freq: Once | ORAL | Status: DC | PRN

## 2022-08-31 MED ORDER — FAMOTIDINE IN NACL 20-0.9 MG/50ML-% IV SOLN
20.0000 mg | Freq: Two times a day (BID) | INTRAVENOUS | Status: DC
  Administered 2022-08-31 (×2): 20 mg via INTRAVENOUS
  Filled 2022-08-31 (×2): qty 50

## 2022-08-31 MED ORDER — ALUM & MAG HYDROXIDE-SIMETH 200-200-20 MG/5ML PO SUSP: 15.0000 mL | ORAL | Status: DC | PRN

## 2022-08-31 MED ORDER — FENTANYL CITRATE (PF) 100 MCG/2ML IJ SOLN
INTRAMUSCULAR | Status: DC | PRN
  Administered 2022-08-31: 50 ug via INTRAVENOUS
  Administered 2022-08-31 (×2): 25 ug via INTRAVENOUS

## 2022-08-31 MED ORDER — HEPARIN SODIUM (PORCINE) 1000 UNIT/ML IJ SOLN
INTRAMUSCULAR | Status: DC | PRN
  Administered 2022-08-31: 8000 [IU] via INTRAVENOUS

## 2022-08-31 MED ORDER — LABETALOL HCL 5 MG/ML IV SOLN: 10.0000 mg | INTRAVENOUS | Status: DC | PRN

## 2022-08-31 MED ORDER — DIPHENHYDRAMINE HCL 50 MG/ML IJ SOLN: 50.0000 mg | Freq: Once | INTRAMUSCULAR | Status: DC | PRN

## 2022-08-31 MED ORDER — IRBESARTAN 150 MG PO TABS
150.0000 mg | ORAL_TABLET | Freq: Every day | ORAL | Status: DC
  Administered 2022-08-31 – 2022-09-01 (×2): 150 mg via ORAL
  Filled 2022-08-31 (×2): qty 1

## 2022-08-31 MED ORDER — NITROGLYCERIN 0.4 MG SL SUBL: 0.4000 mg | SUBLINGUAL_TABLET | SUBLINGUAL | Status: DC | PRN

## 2022-08-31 MED ORDER — DULOXETINE HCL 30 MG PO CPEP
30.0000 mg | ORAL_CAPSULE | Freq: Every day | ORAL | Status: DC
  Administered 2022-08-31 – 2022-09-01 (×2): 30 mg via ORAL
  Filled 2022-08-31 (×3): qty 1

## 2022-08-31 MED ORDER — METOPROLOL SUCCINATE ER 50 MG PO TB24
50.0000 mg | ORAL_TABLET | Freq: Every day | ORAL | Status: DC
  Administered 2022-09-01: 50 mg via ORAL
  Filled 2022-08-31: qty 1

## 2022-08-31 MED ORDER — HYDRALAZINE HCL 20 MG/ML IJ SOLN: 5.0000 mg | INTRAMUSCULAR | Status: DC | PRN

## 2022-08-31 SURGICAL SUPPLY — 20 items
BALLN VTRAC 4.5X30X135 (BALLOONS) ×1
BALLOON VTRAC 4.5X30X135 (BALLOONS) IMPLANT
CATH ANGIO 5F PIGTAIL 100CM (CATHETERS) IMPLANT
CATH BEACON 5 .035 100 H1 TIP (CATHETERS) IMPLANT
COVER DRAPE FLUORO 36X44 (DRAPES) IMPLANT
COVER PROBE ULTRASOUND 5X96 (MISCELLANEOUS) IMPLANT
DEVICE EMBOSHIELD NAV6 4.0-7.0 (FILTER) IMPLANT
DEVICE SAFEGUARD 24CM (GAUZE/BANDAGES/DRESSINGS) IMPLANT
DEVICE STARCLOSE SE CLOSURE (Vascular Products) IMPLANT
DEVICE TORQUE .025-.038 (MISCELLANEOUS) IMPLANT
GLIDEWIRE ANGLED SS 035X260CM (WIRE) IMPLANT
GUIDEWIRE VASC STIFF .038X260 (WIRE) IMPLANT
KIT CAROTID MANIFOLD (MISCELLANEOUS) IMPLANT
KIT ENCORE 26 ADVANTAGE (KITS) IMPLANT
PACK ANGIOGRAPHY (CUSTOM PROCEDURE TRAY) ×1 IMPLANT
SHEATH BRITE TIP 6FRX11 (SHEATH) IMPLANT
SHEATH SHUTTLE 6FRX80 (SHEATH) IMPLANT
STENT XACT CAR 9-7X40X136 (Permanent Stent) IMPLANT
SYR MEDRAD MARK 7 150ML (SYRINGE) IMPLANT
WIRE GUIDERIGHT .035X150 (WIRE) IMPLANT

## 2022-08-31 NOTE — Interval H&P Note (Signed)
History and Physical Interval Note:  08/31/2022 10:22 AM  Marcus [name]  has presented today for surgery, with the diagnosis of R Carotid Stent   ABBOTT   Carotid Artery Stenosis.  The various methods of treatment have been discussed with the patient and family. After consideration of risks, benefits and other options for treatment, the patient has consented to  Procedure(s): CAROTID PTA/STENT INTERVENTION (Right) as a surgical intervention.  The patient's history has been reviewed, patient examined, no change in status, stable for surgery.  I have reviewed the patient's chart and labs.  Questions were answered to the patient's satisfaction.     Leotis Pain

## 2022-08-31 NOTE — Op Note (Signed)
OPERATIVE NOTE DATE: 08/31/2022  PROCEDURE:  Ultrasound guidance for vascular access right femoral artery  Placement of a 9 mm x 7 mm x 4 cm length Exact stent with the use of the NAV-6 embolic protection device in the right carotid artery  PRE-OPERATIVE DIAGNOSIS: 1. High grade right carotid artery stenosis.   POST-OPERATIVE DIAGNOSIS:  Same as above  SURGEON: Leotis Pain, MD  ASSISTANT(S):  none  ANESTHESIA: local/MCS  ESTIMATED BLOOD LOSS:  20 cc  CONTRAST: 50 cc  FLUORO TIME: 3.3 minutes  MODERATE CONSCIOUS SEDATION TIME:  Approximately 34 minutes using 4 mg of Versed and 100 mcg of Fentanyl  FINDING(S): 1.   85-90% right carotid artery stenosis  SPECIMEN(S):   none  INDICATIONS:   Patient is a 57 y.o. male who presents with high grade right carotid artery stenosis.  The patient has a high lesion and carotid artery stenting was felt to be preferred to endarterectomy for that reason.  Risks and benefits were discussed and informed consent was obtained.   DESCRIPTION: After obtaining full informed written consent, the patient was brought back to the vascular suite and placed supine upon the table.  The patient received IV antibiotics prior to induction. Moderate conscious sedation was administered during a face to face encounter with the patient throughout the procedure with my supervision of the RN administering medicines and monitoring the patients vital signs and mental status throughout from the start of the procedure until the patient was taken to the recovery room.  After obtaining adequate anesthesia, the patient was prepped and draped in the standard fashion.   The right femoral artery was visualized with ultrasound and found to be widely patent. It was then accessed under direct ultrasound guidance without difficulty with a Seldinger needle. A permanent image was recorded. A J-wire was placed and we then placed a 6 French sheath. The patient was then heparinized and a  total of 8000 units of intravenous heparin were given and an ACT was checked to confirm successful anticoagulation. A pigtail catheter was then placed into the ascending aorta. This showed a bovine configuration of the aortic arch with no proximal stenosis. I then selectively cannulated the innominate artery without difficulty with a headhunter catheter and advanced into the mid right common carotid artery.  Cervical and cerebral carotid angiography was then performed. There were no obvious intracranial filling defects with minimal anterior cerebral artery flow. The carotid bifurcation demonstrated an 85-90% right internal carotid artery stenosis.  I then advanced into the external carotid artery with a Glidewire and the headhunter catheter and then exchanged for the Amplatz Super Stiff wire. Over the Amplatz Super Stiff wire, a 6 [place] shuttle sheath was placed into the mid common carotid artery. I then used the NAV-6  Embolic protection device and crossed the lesion and parked this in the distal internal carotid artery at the base of the skull.  I then selected a 9 mm proximal 7 mm distal 4 cm long Exact stent. This was deployed across the lesion encompassing it in its entirety. A 4.5 mm diameter x 3 cm length balloon was used to post dilate the stent. Only about a 20% residual stenosis was present after angioplasty. Completion angiogram showed normal intracranial filling without new defects. At this point I elected to terminate the procedure. The sheath was removed and StarClose closure device was deployed in the right femoral artery with excellent hemostatic result. The patient was taken to the recovery room in stable condition having  tolerated the procedure well.  COMPLICATIONS: none  CONDITION: stable  Leotis Pain 08/31/2022 1:23 PM   This note was created with Dragon Medical transcription system. Any errors in dictation are purely unintentional.

## 2022-09-01 ENCOUNTER — Telehealth: Payer: Self-pay | Admitting: *Deleted

## 2022-09-01 ENCOUNTER — Encounter: Payer: Self-pay | Admitting: Vascular Surgery

## 2022-09-01 LAB — BASIC METABOLIC PANEL
Anion gap: 10 (ref 5–15)
BUN: 13 mg/dL (ref 6–20)
CO2: 19 mmol/L — ABNORMAL LOW (ref 22–32)
Calcium: 8.5 mg/dL — ABNORMAL LOW (ref 8.9–10.3)
Chloride: 106 mmol/L (ref 98–111)
Creatinine, Ser: 1.13 mg/dL (ref 0.61–1.24)
GFR, Estimated: >60 mL/min (ref >60)
Glucose, Bld: 94 mg/dL (ref 70–99)
Potassium: 4.6 mmol/L (ref 3.5–5.1)
Sodium: 135 mmol/L (ref 135–145)

## 2022-09-01 LAB — CBC
HCT: 34.8 % — ABNORMAL LOW (ref 39.0–52.0)
Hemoglobin: 11.8 g/dL — ABNORMAL LOW (ref 13.0–17.0)
MCH: 30.8 pg (ref 26.0–34.0)
MCHC: 33.9 g/dL (ref 30.0–36.0)
MCV: 90.9 fL (ref 80.0–100.0)
Platelets: 234 10*3/uL (ref 150–400)
RBC: 3.83 MIL/uL — ABNORMAL LOW (ref 4.22–5.81)
RDW: 14.4 % (ref 11.5–15.5)
WBC: 7.3 10*3/uL (ref 4.0–10.5)
nRBC: 0 % (ref 0.0–0.2)

## 2022-09-01 LAB — POCT ACTIVATED CLOTTING TIME: Activated Clotting Time: 304 seconds

## 2022-09-01 MED ORDER — METHYLPREDNISOLONE 4 MG PO TBPK: ORAL_TABLET | ORAL | 0 refills | Status: AC

## 2022-09-01 MED ORDER — METHYLPREDNISOLONE 4 MG PO TBPK: ORAL_TABLET | ORAL | Status: DC

## 2022-09-01 MED ORDER — DEXAMETHASONE SODIUM PHOSPHATE 4 MG/ML IJ SOLN: 4.0000 mg | Freq: Four times a day (QID) | INTRAMUSCULAR | Status: DC

## 2022-09-01 MED ORDER — CHLORHEXIDINE GLUCONATE CLOTH 2 % EX PADS
6.0000 | MEDICATED_PAD | Freq: Every day | CUTANEOUS | Status: DC
  Administered 2022-08-31: 6 via TOPICAL

## 2022-09-01 MED ORDER — DEXAMETHASONE SODIUM PHOSPHATE 4 MG/ML IJ SOLN
4.0000 mg | Freq: Four times a day (QID) | INTRAMUSCULAR | Status: DC
  Administered 2022-09-01: 4 mg via INTRAVENOUS
  Filled 2022-09-01: qty 1

## 2022-09-01 NOTE — Discharge Summary (Signed)
[facility]    Discharge Summary    Patient ID:  PAX MONNIER MRN: [id number] DOB/AGE: [id number] y.o.  Admit date: 08/31/2022 Discharge date: 09/01/2022 Date of Surgery: 08/31/2022 Surgeon: Surgeon(s): Algernon Huxley, MD  Admission Diagnosis: Carotid stenosis, right [I65.21]  Discharge Diagnoses:  Carotid stenosis, right [I65.21]  Secondary Diagnoses: Past Medical History:  Diagnosis Date   CAD in native artery    a. 06/28/2014: STEMI, cath LM nl, mLAD 50%, mLCx 20%, OM1 small in size 80%, ramus 100% s/p PCI/DES 0%, EF 40%    Carotid artery disease (HCC)    Cocaine abuse ([place])    Depression    H/O gastric ulcer    Hyperglycemia    a. A1C 5.6%   Hypertension    Ischemic cardiomyopathy    a. EF 40% by cath 06/28/2014   MI (myocardial infarction) ([place])    Polysubstance abuse ([place])    a. cocaine, tobacco, and alcohol    Tobacco abuse     Procedure(s): CAROTID PTA/STENT INTERVENTION  Discharged Condition: good  HPI:  [name] is a 57 year old male who underwent cardiac workup for syncopal episodes. He has a long history of severe cardiac disease with multiple previous interventions. On CT angiogram he was found to have high-grade right carotid stenosis. Today he is postop day 1 from right carotid stent placement.   This morning on examination he is resting comfortably in bed. He does complain of reperfusion headache. Dr. Lucky Cowboy at the bedside ordered 4 mg of Decadron every 6 hours for his headache. Vitals all remained stable and no other complaints overnight.   Patient At 13:30 PM reports no headache and wishes to be discharged home. Patient discharged home to resume his prior medications and ordered a medrol dose pack taper for headaches. Patient verbalizes his understanding.   Hospital Course:  SAMER BOTBYL is a 57 y.o. male is S/P Right Extubated: POD # 0 Physical Exam:  Alert notes x3, no acute distress Face:  Symmetrical.  Tongue is midline. Neck: Trachea is midline.  No swelling or bruising. Cardiovascular: Regular rate and rhythm Pulmonary: Clear to auscultation bilaterally Abdomen: Soft, nontender, nondistended Right groin access: Clean dry and intact.  No swelling or drainage noted Left lower extremity: Thigh soft.  Calf soft.  Extremities warm distally toes.  Hard to palpate pedal pulses however the foot is warm is her good capillary refill. Right lower extremity: Thigh soft.  Calf soft.  Extremities warm distally toes.  Hard to palpate pedal pulses however the foot is warm is her good capillary refill. Neurological: No deficits noted. Patient answered all questions appropriately   Post-op wounds:  clean, dry, intact or healing well  Pt. Ambulating, voiding and taking PO diet without difficulty. Pt pain controlled with PO pain meds.  Labs:  As below  Complications: none  Consults:    Significant Diagnostic Studies: CBC Lab Results  Component Value Date   WBC 7.3 09/01/2022   HGB 11.8 (L) 09/01/2022   HCT 34.8 (L) 09/01/2022   MCV 90.9 09/01/2022   PLT 234 09/01/2022    BMET    Component Value Date/Time   NA 135 09/01/2022 0511   NA 139 06/29/2014 0353   K 4.6 09/01/2022 0511   K 3.6 06/29/2014 0353   CL 106 09/01/2022 0511   CL 107 06/29/2014 0353   CO2 19 (L) 09/01/2022 0511   CO2 25 06/29/2014 0353   GLUCOSE 94 09/01/2022 0511  GLUCOSE 109 (H) 06/29/2014 0353   BUN 13 09/01/2022 0511   BUN 8 06/29/2014 0353   CREATININE 1.13 09/01/2022 0511   CREATININE 0.92 06/29/2014 0353   CALCIUM 8.5 (L) 09/01/2022 0511   CALCIUM 8.6 06/29/2014 0353   GFRNONAA >60 09/01/2022 0511   GFRNONAA >60 06/29/2014 0353   GFRAA >60 10/19/2016 1238   GFRAA >60 06/29/2014 0353   COAG Lab Results  Component Value Date   INR 0.9 06/28/2014     Disposition:  Discharge to :Home  Allergies as of 09/01/2022       Reactions   Oxycodone-acetaminophen Other (See Comments)    Codeine Rash        Medication List     TAKE these medications    aspirin 81 MG tablet Take 81 mg by mouth daily.   clopidogrel 75 MG tablet Commonly known as: PLAVIX Take 1 tablet (75 mg total) by mouth daily.   DULoxetine 30 MG capsule Commonly known as: CYMBALTA Take 30 mg by mouth daily.   fluticasone-salmeterol 250-50 MCG/ACT Aepb Commonly known as: Advair Diskus Inhale 1 puff into the lungs in the morning and at bedtime.   hydrOXYzine 25 MG capsule Commonly known as: VISTARIL Take 25 mg by mouth 2 (two) times daily.   irbesartan 150 MG tablet Commonly known as: AVAPRO Take 1 tablet (150 mg total) by mouth daily.   isosorbide mononitrate 30 MG 24 hr tablet Commonly known as: IMDUR Take 1 tablet (30 mg total) by mouth daily.   methylPREDNISolone 4 MG Tbpk tablet Commonly known as: MEDROL DOSEPAK Use as directed Medrol Dose Pack Package Start taking on: September 02, 2022   metoprolol succinate 50 MG 24 hr tablet Commonly known as: TOPROL-XL Take 1 tablet (50 mg total) by mouth daily. Take with or immediately following a meal.   nitroGLYCERIN 0.4 MG SL tablet Commonly known as: NITROSTAT SMARTSIG:1 Sublingual Daily   pantoprazole 40 MG tablet Commonly known as: PROTONIX Take 40 mg by mouth daily.   QUEtiapine 50 MG Tb24 24 hr tablet Commonly known as: SEROQUEL XR Take 50 mg by mouth daily. Take 2 tablets by mouth daily at bedtime   rosuvastatin 40 MG tablet Commonly known as: CRESTOR Take 40 mg by mouth daily.   Ventolin HFA 108 (90 Base) MCG/ACT inhaler Generic drug: albuterol SMARTSIG:2 Puff(s) By Mouth Every 4 Hours PRN       Verbal and written Discharge instructions given to the patient. Wound care per Discharge AVS  Follow-up Information     Dew, Erskine Squibb, MD Follow up in 1 month(s).   Specialties: Vascular Surgery, Radiology, Interventional Cardiology Why: Duplex ultrasound of right carotid. Contact information: [place] [address] [phone]                 Signed: Drema Pry, NP  09/01/2022, 1:42 PM

## 2022-09-01 NOTE — Telephone Encounter (Signed)
error 

## 2022-09-01 NOTE — Telephone Encounter (Signed)
To [place] scheduling team to assist with this follow up appointment.

## 2022-09-01 NOTE — Progress Notes (Signed)
  Progress Note    09/01/2022 10:05 AM 1 Day Post-Op  Subjective: Mr. Athel Merriweather is a 57 year old male who underwent cardiac workup for syncopal episodes.  He has a long history of severe cardiac disease with multiple previous interventions.  On CT angiogram he was found to have high-grade right carotid stenosis.  Today he is postop day 1 from right carotid stent placement.  This morning on examination he is resting comfortably in bed.  He does complain of reperfusion headache.  Dr. Lucky Cowboy at the bedside ordered 4 mg of Decadron every 6 hours for his headache.  Vitals all remained stable and no other complaints overnight.   Vitals:   09/01/22 0800 09/01/22 0842  BP: 110/65 110/72  Pulse:  88  Resp: 13 15  Temp:  98.9 F (37.2 C)  SpO2: 95% 97%   Physical Exam: Cardiac:  RRR  Lungs:  Clear to auscultation bilaterally Incisions:  Rt groin without hematoma or seroma. Dressing C/D/I  Extremities:  Palpable pulses throughout Abdomen:  Soft, nontender, non-distended Positive bowel sounds Neurologic: AAOX3 Nu[place] Intact. Answers all questions appropriately.   CBC    Component Value Date/Time   WBC 7.3 09/01/2022 0511   RBC 3.83 (L) 09/01/2022 0511   HGB 11.8 (L) 09/01/2022 0511   HGB 15.7 06/28/2014 0718   HCT 34.8 (L) 09/01/2022 0511   HCT 47.9 06/28/2014 0718   PLT 234 09/01/2022 0511   PLT 375 06/28/2014 0718   MCV 90.9 09/01/2022 0511   MCV 95 06/28/2014 0718   MCH 30.8 09/01/2022 0511   MCHC 33.9 09/01/2022 0511   RDW 14.4 09/01/2022 0511   RDW 12.8 06/28/2014 0718    BMET    Component Value Date/Time   NA 135 09/01/2022 0511   NA 139 06/29/2014 0353   K 4.6 09/01/2022 0511   K 3.6 06/29/2014 0353   CL 106 09/01/2022 0511   CL 107 06/29/2014 0353   CO2 19 (L) 09/01/2022 0511   CO2 25 06/29/2014 0353   GLUCOSE 94 09/01/2022 0511   GLUCOSE 109 (H) 06/29/2014 0353   BUN 13 09/01/2022 0511   BUN 8 06/29/2014 0353   CREATININE 1.13 09/01/2022 0511    CREATININE 0.92 06/29/2014 0353   CALCIUM 8.5 (L) 09/01/2022 0511   CALCIUM 8.6 06/29/2014 0353   GFRNONAA >60 09/01/2022 0511   GFRNONAA >60 06/29/2014 0353   GFRAA >60 10/19/2016 1238   GFRAA >60 06/29/2014 0353    INR    Component Value Date/Time   INR 0.9 06/28/2014 0718     Intake/Output Summary (Last 24 hours) at 09/01/2022 1005 Last data filed at 09/01/2022 0240 Gross per 24 hour  Intake 3508.97 ml  Output 1850 ml  Net 1658.97 ml     Assessment/Plan:  57 y.o. male is s/p right carotid stent placement endovascular 1 Day Post-Op Patients Vitals and blood pressure all remain normal. Patient does complain of reperfusion headache this morning.    PLAN:  Monitor Neuro status.  Decadron 4 mg ordered to help with reperfusion headaches.  Ambulate in halls with assistance. If headaches subside patient may be discharged to home later today if not plan for tomorrow 09/02/22  DVT prophylaxis:  ASSA 81 mg and Plavix 75 MG   Nasier Thumm R [facility] 09/01/2022 10:05 AM

## 2022-09-01 NOTE — Telephone Encounter (Signed)
-----   Message from [place], PA-C sent at 08/27/2022  2:26 PM EST ----- Regarding: hosp follow-up Can we get a sooner follow-up? He is post-cath. Should be seen back in 2-3 weeks. Thanks

## 2022-09-03 ENCOUNTER — Encounter: Payer: Self-pay | Admitting: Internal Medicine

## 2022-09-09 ENCOUNTER — Telehealth: Payer: Self-pay | Admitting: Cardiovascular Disease

## 2022-09-09 NOTE — Telephone Encounter (Signed)
Bernie Covey NP at [facility] calling to inform the patient had a syncopal episode Saturday and she is concerned because he has had stent put in recently. She says the patient did scrape up his face when he fell. She says he does not follow up until next month and would like a call back on whether he should be seen sooner. Phone: [phone]

## 2022-09-16 ENCOUNTER — Telehealth: Payer: Self-pay | Admitting: *Deleted

## 2022-09-16 DIAGNOSIS — I5020 Unspecified systolic (congestive) heart failure: Secondary | ICD-10-CM

## 2022-09-16 DIAGNOSIS — R55 Syncope and collapse: Secondary | ICD-10-CM

## 2022-09-16 DIAGNOSIS — I255 Ischemic cardiomyopathy: Secondary | ICD-10-CM

## 2022-09-16 NOTE — Telephone Encounter (Signed)
I am okay with referral for home health PT.  It is unclear where this recommendation originally came from.  Diagnoses for PT can include: CAD status post PCI, carotid artery disease status post stenting, weakness, and syncope.  If PT or insurance requires alternative diagnoses, we may have to reach out to his PCP for assistance with this.

## 2022-09-16 NOTE — Telephone Encounter (Signed)
Community message sent to [facility] to see if they can assist with the PT in home services.

## 2022-09-16 NOTE — Telephone Encounter (Signed)
Called patient and inquired how I could assist with getting him some PT services at home. He didn't want services in his home. Reviewed message I received and he does not recall that conversation. He prefers to come here at [facility] for his therapy. Advised that I would check with provider and will be in touch.   Provider replied with recommendations. Will reach back out to [facility] with patients wishes.

## 2022-09-16 NOTE — Telephone Encounter (Signed)
-----   Message from Irving Shows, RN sent at 09/16/2022  3:30 PM EST ----- Regarding: Physical Therapy Request Good afternoon!   We contacted this patient regarding his cardiac rehab referral. He was insistent that someone told him that he could get 5 days a week of home health physical therapy because he can't even stand for long. I looked through his notes and couldn't find any recent PT requests. Would your office be able to put in a physical therapy referral for him? Once he is stronger and more independent, we can get him in for cardiac rehab.   Thank you so much!  Renita Papa RN, BSN [facility]

## 2022-09-20 NOTE — Procedures (Signed)
[facility] [place], [address]    Complete Pulmonary Function Testing Interpretation:  FINDINGS:   The forced vital capacity is mildly decreased FEV1 is 2.219 L which is 78% of predicted and is mildly decreased.  F1 FVC ratio is normal.  Postbronchodilator there was significant improvement in the FEV1.  Total lung capacity is mildly decreased RV is decreased FRC is decreased.  DLCO is within normal limits.  IMPRESSION:  's pulmonary function study is suggestive of mild obstructive lung disease  Allyne Gee, MD [facility] Critical Care Medicine Sleep Medicine

## 2022-09-22 LAB — PULMONARY FUNCTION TEST

## 2022-09-28 ENCOUNTER — Other Ambulatory Visit (INDEPENDENT_AMBULATORY_CARE_PROVIDER_SITE_OTHER): Payer: Self-pay | Admitting: Vascular Surgery

## 2022-09-28 DIAGNOSIS — I6523 Occlusion and stenosis of bilateral carotid arteries: Secondary | ICD-10-CM

## 2022-09-29 ENCOUNTER — Ambulatory Visit (INDEPENDENT_AMBULATORY_CARE_PROVIDER_SITE_OTHER): Payer: Medicaid Other

## 2022-09-29 ENCOUNTER — Encounter (INDEPENDENT_AMBULATORY_CARE_PROVIDER_SITE_OTHER): Payer: Self-pay | Admitting: Nurse Practitioner

## 2022-09-29 ENCOUNTER — Ambulatory Visit (INDEPENDENT_AMBULATORY_CARE_PROVIDER_SITE_OTHER): Payer: Medicaid Other | Admitting: Nurse Practitioner

## 2022-09-29 VITALS — BP 175/88 | HR 65 | Resp 18 | Ht 63.0 in | Wt 162.2 lb

## 2022-09-29 DIAGNOSIS — I10 Essential (primary) hypertension: Secondary | ICD-10-CM

## 2022-09-29 DIAGNOSIS — E782 Mixed hyperlipidemia: Secondary | ICD-10-CM

## 2022-09-29 DIAGNOSIS — I6523 Occlusion and stenosis of bilateral carotid arteries: Secondary | ICD-10-CM | POA: Diagnosis not present

## 2022-09-30 NOTE — Progress Notes (Signed)
Subjective:    Patient ID: Marcus [name], male    DOB: 16-Apr-1966, 57 y.o.   MRN: [id number] Chief Complaint  Patient presents with   Follow-up    f/u procedure in 1 months with carotid    The patient is seen for follow up evaluation of carotid stenosis status post right carotid stenting on 02/812/2024.  There were no post operative problems or complications related to the surgery.  The patient denies neck or incisional pain.  The patient denies interval amaurosis fugax. There is no recent history of TIA symptoms or focal motor deficits. There is no prior documented CVA.  Since the patient's carotid stenting he is also underwent a left heart cath with intervention.  The patient denies headache.  The patient is taking enteric-coated aspirin 81 mg daily.  No recent shortening of the patient's walking distance or new symptoms consistent with claudication.  No history of rest pain symptoms. No new ulcers or wounds of the lower extremities have occurred.  There is no history of DVT, PE or superficial thrombophlebitis. No recent episodes of angina or shortness of breath documented.   Noninvasive studies today show no significant stenosis of the right ICA post stent placement.  The stent is widely patent.  60 to 79% stenosis of the left ICA however the velocities suggest that is closer towards 75%.  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  Normal flow hemodynamics in the bilateral subclavian arteries.     Review of Systems  Eyes:  Negative for visual disturbance.  All other systems reviewed and are negative.      Objective:   Physical Exam Vitals reviewed.  HENT:     Head: Normocephalic.  Neck:     Vascular: No carotid bruit.  Cardiovascular:     Rate and Rhythm: Normal rate.     Pulses: Normal pulses.  Pulmonary:     Effort: Pulmonary effort is normal.  Skin:    General: Skin is warm and dry.  Neurological:      Mental Status: He is alert and oriented to person, place, and time.  Psychiatric:        Mood and Affect: Mood normal.        Behavior: Behavior normal.        Thought Content: Thought content normal.        Judgment: Judgment normal.     BP (!) 175/88 (BP Location: Left Arm)   Pulse 65   Resp 18   Ht '5\' 3"'$  (1.6 m)   Wt 162 lb 3.2 oz (73.6 kg)   BMI 28.73 kg/m   Past Medical History:  Diagnosis Date   CAD in native artery    a. 06/28/2014: STEMI, cath LM nl, mLAD 50%, mLCx 20%, OM1 small in size 80%, ramus 100% s/p PCI/DES 0%, EF 40%    Carotid artery disease (HCC)    Cocaine abuse ([place])    Depression    H/O gastric ulcer    Hyperglycemia    a. A1C 5.6%   Hypertension    Ischemic cardiomyopathy    a. EF 40% by cath 06/28/2014   MI (myocardial infarction) ([place])    Polysubstance abuse ([place])    a. cocaine, tobacco, and alcohol    Tobacco abuse     Social History   Socioeconomic History   Marital status: Single    Spouse name: Not on file   Number of children: Not on file  Years of education: Not on file   Highest education level: Not on file  Occupational History   Not on file  Tobacco Use   Smoking status: Every Day    Packs/day: 1.00    Years: 38.00    Total pack years: 38.00    Types: Cigarettes    Start date: 07/21/1975   Smokeless tobacco: Never  Vaping Use   Vaping Use: Never used  Substance and Sexual Activity   Alcohol use: Yes    Comment: every weekend 6-12 beer   Drug use: No    Types: Cocaine, Marijuana    Comment: none in at least 2 years   Sexual activity: Not on file  Other Topics Concern   Not on file  Social History Narrative   Not on file   Social Determinants of Health   Financial Resource Strain: Not on file  Food Insecurity: Not on file  Transportation Needs: Not on file  Physical Activity: Not on file  Stress: Not on file  Social Connections: Not on file  Intimate Partner Violence: Not on file    Past Surgical History:   Procedure Laterality Date   CARDIAC CATHETERIZATION  06/28/2014   x1 stent   CAROTID PTA/STENT INTERVENTION Right 08/31/2022   Procedure: CAROTID PTA/STENT INTERVENTION;  Surgeon: Algernon Huxley, MD;  Location: [facility];  Service: Cardiovascular;  Laterality: Right;   COLONOSCOPY N/A 08/12/2022   Procedure: COLONOSCOPY;  Surgeon: Toledo, Benay Pike, MD;  Location: [facility];  Service: Gastroenterology;  Laterality: N/A;   CORONARY STENT INTERVENTION N/A 08/26/2022   Procedure: CORONARY STENT INTERVENTION;  Surgeon: Nelva Bush, MD;  Location: [place] [facility];  Service: Cardiovascular;  Laterality: N/A;   ESOPHAGOGASTRODUODENOSCOPY N/A 08/12/2022   Procedure: ESOPHAGOGASTRODUODENOSCOPY (EGD);  Surgeon: Toledo, Benay Pike, MD;  Location: [facility];  Service: Gastroenterology;  Laterality: N/A;   LEFT HEART CATH AND CORONARY ANGIOGRAPHY Left 08/26/2022   Procedure: LEFT HEART CATH AND CORONARY ANGIOGRAPHY;  Surgeon: Nelva Bush, MD;  Location: [facility] [facility];  Service: Cardiovascular;  Laterality: Left;   NISSEN FUNDOPLICATION     stomach ulcer surgery      Family History  Problem Relation Age of Onset   Heart failure Mother    Stroke Father    Heart failure Father    Prostate cancer Brother    Heart attack Brother     Allergies  Allergen Reactions   Oxycodone-Acetaminophen Other (See Comments)   Codeine Rash       Latest Ref Rng & Units 09/01/2022    5:11 AM 08/27/2022    2:30 AM 08/12/2022    3:40 PM  CBC  WBC 4.0 - 10.5 K/uL 7.3  6.9  7.9   Hemoglobin 13.0 - 17.0 g/dL 11.8  12.9  13.9   Hematocrit 39.0 - 52.0 % 34.8  37.6  41.5   Platelets 150 - 400 K/uL 234  235  316       CMP     Component Value Date/Time   NA 135 09/01/2022 0511   NA 139 06/29/2014 0353   K 4.6 09/01/2022 0511   K 3.6 06/29/2014 0353   CL 106 09/01/2022 0511   CL 107 06/29/2014 0353   CO2 19 (L) 09/01/2022 0511   CO2 25 06/29/2014 0353   GLUCOSE 94 09/01/2022  0511   GLUCOSE 109 (H) 06/29/2014 0353   BUN 13 09/01/2022 0511   BUN 8 06/29/2014 0353   CREATININE 1.13 09/01/2022 0511  CREATININE 0.92 06/29/2014 0353   CALCIUM 8.5 (L) 09/01/2022 0511   CALCIUM 8.6 06/29/2014 0353   PROT 7.7 08/12/2022 1540   PROT 7.6 06/28/2014 0718   ALBUMIN 4.2 08/12/2022 1540   ALBUMIN 3.7 06/28/2014 0718   AST 31 08/12/2022 1540   AST 257 (H) 06/28/2014 0718   ALT 36 08/12/2022 1540   ALT 48 06/28/2014 0718   ALKPHOS 60 08/12/2022 1540   ALKPHOS 86 06/28/2014 0718   BILITOT 0.6 08/12/2022 1540   BILITOT 0.2 06/28/2014 0718   GFRNONAA >60 09/01/2022 0511   GFRNONAA >60 06/29/2014 0353   GFRAA >60 10/19/2016 1238   GFRAA >60 06/29/2014 0353     No results found.     Assessment & Plan:   1. Bilateral carotid artery stenosis Recommend:  The patient is s/p successful right carotid stent  Duplex ultrasound  shows no significant  stenosis.  Continue antiplatelet therapy as prescribed Continue management of CAD, HTN and Hyperlipidemia Healthy heart diet,  encouraged exercise at least 4 times per week  The patient's NIHSS score is as follows: 1 Mild: 1 - 5 Mild to Moderately Severe: 5 - 14 Severe: 15 - 24 Very Severe: >25  Follow up in 2 months to repeat carotid duplex as well as to discuss possible intervention on the left ICA. - VAS US CAROTID  2. Primary hypertension Continue antihypertensive medications as already ordered, these medications have been reviewed and there are no changes at this time.  3. Mixed hyperlipidemia Continue statin as ordered and reviewed, no changes at this time   Current Outpatient Medications on File Prior to Visit  Medication Sig Dispense Refill   aspirin 81 MG tablet Take 81 mg by mouth daily.     clopidogrel (PLAVIX) 75 MG tablet Take 1 tablet (75 mg total) by mouth daily. 30 tablet 6   DULoxetine (CYMBALTA) 30 MG capsule Take 30 mg by mouth daily.     fluticasone-salmeterol (ADVAIR DISKUS) 250-50  MCG/ACT AEPB Inhale 1 puff into the lungs in the morning and at bedtime. 60 each 5   hydrOXYzine (VISTARIL) 25 MG capsule Take 25 mg by mouth 2 (two) times daily.     irbesartan (AVAPRO) 150 MG tablet Take 1 tablet (150 mg total) by mouth daily. 90 tablet 3   isosorbide mononitrate (IMDUR) 30 MG 24 hr tablet Take 1 tablet (30 mg total) by mouth daily. 90 tablet 1   metoprolol succinate (TOPROL-XL) 50 MG 24 hr tablet Take 1 tablet (50 mg total) by mouth daily. Take with or immediately following a meal. 90 tablet 3   nicotine (NICODERM CQ - DOSED IN MG/24 HOURS) 21 mg/24hr patch 21 mg daily.     nitroGLYCERIN (NITROSTAT) 0.4 MG SL tablet SMARTSIG:1 Sublingual Daily     pantoprazole (PROTONIX) 40 MG tablet Take 40 mg by mouth daily.     QUEtiapine (SEROQUEL XR) 50 MG TB24 24 hr tablet Take 50 mg by mouth daily. Take 2 tablets by mouth daily at bedtime     rosuvastatin (CRESTOR) 40 MG tablet Take 40 mg by mouth daily.     VENTOLIN HFA 108 (90 Base) MCG/ACT inhaler SMARTSIG:2 Puff(s) By Mouth Every 4 Hours PRN     No current facility-administered medications on file prior to visit.    There are no Patient Instructions on file for this visit. No follow-ups on file.   Kris Hartmann, NP

## 2022-10-07 NOTE — Progress Notes (Deleted)
Cardiology Office Note    Date:  10/07/2022   ID:  Marcus [name], DOB [birth year], MRN [id number]  PCP:  Dianna Rossetti, NP  Cardiologist:  Ida Rogue, MD  Electrophysiologist:  None   Chief Complaint: Follow up  History of Present Illness:   Marcus [name] is a 57 y.o. male with history of CAD with ST elevation MI in 06/2014 status post PCI/DES to the ramus intermedius status post PCI and intracoronary lithotripsy of the ramus intermedius in 08/2022, HFrEF secondary to ICM, carotid artery disease status post right ICA stenting in 08/2022, syncope, polysubstance use including EtOH, tobacco, and cocaine use, HTN, and HLD who presents for ***  He was admitted to the hospital on 06/2014 with an ST elevation MI with LHC showing an occluded ramus intermedius status post PCI/DES.  Remaining cath details included mid LAD 50% stenosis, mid LCx 20% stenosis, and OM1 80% stenosis.  EF 40%.  Urine drug screen positive for cocaine at that time.  He followed up in 06/2014 and was was not taking his medications, including aspirin and Effient.  He was lost to follow-up until he reestablished care with our office in 01/2022.  At that time, he was not on Effient.  He reported left-sided chest pain, left arm pain, and left arm numbness.  He continues to smoke 1 pack/day.  Subsequent Lexiscan MPI in 01/2022 showed no significant ischemia with a large region of fixed defect along the lateral wall consistent with prior MI and an EF of 52%.  Overall, this was a moderate risk scan given size of prior MI.  He was last seen in the office in 07/2022 noting sharp left-sided chest pain that radiated to the left shoulder and at times into the left arm with associated numbness of the left arm.  Symptoms feel similar to his prior angina.  He also reported 2 episodes of syncope that occurred while he was ambulating in his house that were not associated with tachypalpitations, chest pain, diaphoresis, or preceding dizziness.   There was no loss of bowel or bladder function.  He reported he had been off all medications from late 2015 until establishing with a PCP in mid 2023.  He continued to smoke a little under 1 pack/day and is not interested in fully quitting.  He had reduced his beer intake from 18-24 beers per day to approximately 6 beers 1 night per week.  He reported he had not used cocaine since 2015.    Zio patch was placed and remains pending at this time.  Echo on 08/17/2022 showed an EF of 55 to 60%, no regional wall motion abnormalities, grade 2 diastolic dysfunction, normal RV systolic function and ventricular cavity size, and mild mitral regurgitation.  Carotid artery ultrasound on 08/17/2022 showed 60 to 79% R ICA stenosis, 80 to 123456 LICA stenosis, scant flow in the left vertebral artery, and stenosis of the left subclavian artery.  Subsequent CTA of the head/neck showed severe focal stenosis of approximately 80% at the proximal right ICA with approximately 50% stenosis of the proximal left ICA, and small in caliber left vertebral artery with possible severe focal stenosis at the origin.  He was subsequently evaluated by vascular surgery with recommendation to undergo carotid artery stenting.  Prior to undergoing carotid artery intervention, he underwent LHC on 08/26/2022 which showed severe single-vessel CAD with 50 to 60% in-stent restenosis of the ramus stent and 90% Denovo lesion just distal to the stent.  There was mild to  moderate, noncritical disease involving the LAD, LCx, and RCA.  There was a 70% stenosis in the apical LAD which was too small/distal for PCI.  He underwent successful PCI and intracoronary lithotripsy to the ramus intermedius with reduction of in-stent restenosis to 20 to 30% and reduction of the Denovo lesion to 0%.     We received a phone call from outside office the patient reported a syncopal episode for which she did not seek medical care.  Outside office reported BP of 177/82 and 161/77 with  a rate of 76 bpm.  He followed up with vascular surgery on 09/29/2022 and artery ultrasound at that time showing no evidence of stenosis in the right ICA, 60 to 79% stenosis in the left ICA, antegrade flow of the right vertebral artery, and normal flow hemodynamics in the bilateral subclavian arteries.  *** Zio patch   Labs independently reviewed: 08/2022 - potassium 4.6, BUN 13, serum creatinine 1.13, Hgb 11.8, PLT 234, LP(a) 27 07/2022 - albumin 4.2, AST/ALT normal, TC 138, TG 143, HDL 61, LDL 48, direct LDL 45  Past Medical History:  Diagnosis Date   CAD in native artery    a. 06/28/2014: STEMI, cath LM nl, mLAD 50%, mLCx 20%, OM1 small in size 80%, ramus 100% s/p PCI/DES 0%, EF 40%    Carotid artery disease (HCC)    Cocaine abuse ([place])    Depression    H/O gastric ulcer    Hyperglycemia    a. A1C 5.6%   Hypertension    Ischemic cardiomyopathy    a. EF 40% by cath 06/28/2014   MI (myocardial infarction) ([place])    Polysubstance abuse ([place])    a. cocaine, tobacco, and alcohol    Tobacco abuse     Past Surgical History:  Procedure Laterality Date   CARDIAC CATHETERIZATION  06/28/2014   x1 stent   CAROTID PTA/STENT INTERVENTION Right 08/31/2022   Procedure: CAROTID PTA/STENT INTERVENTION;  Surgeon: Algernon Huxley, MD;  Location: [place] [facility];  Service: Cardiovascular;  Laterality: Right;   COLONOSCOPY N/A 08/12/2022   Procedure: COLONOSCOPY;  Surgeon: Toledo, Benay Pike, MD;  Location: [facility];  Service: Gastroenterology;  Laterality: N/A;   CORONARY STENT INTERVENTION N/A 08/26/2022   Procedure: CORONARY STENT INTERVENTION;  Surgeon: Nelva Bush, MD;  Location: [place] [facility];  Service: Cardiovascular;  Laterality: N/A;   ESOPHAGOGASTRODUODENOSCOPY N/A 08/12/2022   Procedure: ESOPHAGOGASTRODUODENOSCOPY (EGD);  Surgeon: Toledo, Benay Pike, MD;  Location: [facility];  Service: Gastroenterology;  Laterality: N/A;   LEFT HEART CATH AND CORONARY ANGIOGRAPHY Left  08/26/2022   Procedure: LEFT HEART CATH AND CORONARY ANGIOGRAPHY;  Surgeon: Nelva Bush, MD;  Location: [place] [facility];  Service: Cardiovascular;  Laterality: Left;   NISSEN FUNDOPLICATION     stomach ulcer surgery      Current Medications: No outpatient medications have been marked as taking for the 10/09/22 encounter (Appointment) with Rise Mu, PA-C.    Allergies:   Oxycodone-acetaminophen and Codeine   Social History   Socioeconomic History   Marital status: Single    Spouse name: Not on file   Number of children: Not on file   Years of education: Not on file   Highest education level: Not on file  Occupational History   Not on file  Tobacco Use   Smoking status: Every Day    Packs/day: 1.00    Years: 38.00    Additional pack years: 0.00    Total pack years: 38.00  Types: Cigarettes    Start date: 07/21/1975   Smokeless tobacco: Never  Vaping Use   Vaping Use: Never used  Substance and Sexual Activity   Alcohol use: Yes    Comment: every weekend 6-12 beer   Drug use: No    Types: Cocaine, Marijuana    Comment: none in at least 2 years   Sexual activity: Not on file  Other Topics Concern   Not on file  Social History Narrative   Not on file   Social Determinants of Health   Financial Resource Strain: Not on file  Food Insecurity: Not on file  Transportation Needs: Not on file  Physical Activity: Not on file  Stress: Not on file  Social Connections: Not on file     Family History:  The patient's family history includes Heart attack in his brother; Heart failure in his father and mother; Prostate cancer in his brother; Stroke in his father.  ROS:   12-point review of systems is negative unless otherwise noted in the HPI.   EKGs/Labs/Other Studies Reviewed:    Studies reviewed were summarized above. The additional studies were reviewed today:  Carotid artery ultrasound 09/29/2022: Summary:  Right Carotid: There is no evidence of stenosis  in the right ICA.   Left Carotid: Velocities in the left ICA are consistent with a 60-79%  stenosis.   Vertebrals: Right vertebral artery demonstrates antegrade flow. Left  vertebral artery demonstrates no discernable flow.  Subclavians: Normal flow hemodynamics were seen in bilateral subclavian arteries.  __________  LHC 08/26/2022: Conclusions: Severe single vessel coronary artery disease with 50-60% in-stent restenosis of ramus stent and 90% de-novo lesion just distal to the stent. Mild-moderate, noncritical disease involving the LAD, LCx, and RCA.  There is a 70% stenosis in the apical LAD, which is too small/distal for percutaneous intervention Mildly elevated left ventricular for pressure (LVEDP 19 mmHg). Successful PCI and intracoronary lithotripsy to ramus intermedius with reduction of in-stent restenosis to 20-30% and reduction of de-novo lesion to 0% with TIMI-3 flow.   Recommendations: Overnight observation. Dual antiplatelet therapy with aspirin and clopidogrel for at least 12 months. Aggressive secondary prevention of coronary artery disease. __________  2D echo 08/17/2022: 1. Left ventricular ejection fraction, by estimation, is 55 to 60%. The  left ventricle has normal function. The left ventricle has no regional  wall motion abnormalities. Left ventricular diastolic parameters are  consistent with Grade II diastolic  dysfunction (pseudonormalization). The average left ventricular global  longitudinal strain is -19.5 %. The global longitudinal strain is normal.   2. Right ventricular systolic function is normal. The right ventricular  size is normal.   3. The mitral valve is normal in structure. Mild mitral valve  regurgitation.   4. The aortic valve is tricuspid. Aortic valve regurgitation is not  visualized.  ___________  Carotid artery ultrasound 08/17/2022: Summary:  Right Carotid: Velocities in the right ICA are consistent with a 60-79% stenosis.  Non-hemodynamically significant plaque <50% noted in the CCA. The ECA appears >50% stenosed.   Left Carotid: Velocities in the left ICA are consistent with a 80-99%  stenosis. Non-hemodynamically significant plaque <50% noted in the  CCA. The ECA appears >50% stenosed.   Vertebrals:  Left vertebral artery demonstrates antegrade flow. Right  vertebral artery was not visualized. Scant flow in left vertebral  artery.  Subclavians: Left subclavian artery was stenotic. Normal flow hemodynamics  were seen in the right subclavian artery.  __________  [facility] MPI 02/13/2022: Pharmacological myocardial perfusion  imaging study with no significant  ischemia Large region fixed defect lateral wall consistent with prior MI Normal wall motion, EF estimated at 52% No EKG changes concerning for ischemia at peak stress or in recovery. Moderate risk scan given size of prior MI __________   [facility] 06/28/2014: Mid LAD 50% stenosis, mid LCx 20% stenosis, OM1 80% stenosis, ramus 100% stenosis status post PCI/DES.  EF 40%.   EKG:  EKG is ordered today.  The EKG ordered today demonstrates ***  Recent Labs: 08/12/2022: ALT 36 09/01/2022: BUN 13; Creatinine, Ser 1.13; Hemoglobin 11.8; Platelets 234; Potassium 4.6; Sodium 135  Recent Lipid Panel    Component Value Date/Time   CHOL 138 08/12/2022 1540   CHOL 140 06/29/2014 0353   TRIG 143 08/12/2022 1540   TRIG 176 06/29/2014 0353   HDL 61 08/12/2022 1540   HDL 45 06/29/2014 0353   CHOLHDL 2.3 08/12/2022 1540   VLDL 29 08/12/2022 1540   VLDL 35 06/29/2014 0353   LDLCALC 48 08/12/2022 1540   LDLCALC 60 06/29/2014 0353   LDLDIRECT 45 08/12/2022 1540    PHYSICAL EXAM:    VS:  There were no vitals taken for this visit.  BMI: There is no height or weight on file to calculate BMI.  Physical Exam  Wt Readings from Last 3 Encounters:  09/29/22 162 lb 3.2 oz (73.6 kg)  08/31/22 158 lb 1.1 oz (71.7 kg)  08/27/22 163 lb 6.4 oz (74.1 kg)     ASSESSMENT  & PLAN:   CAD involving native coronary arteries without***angina:  History of syncope:  HFrEF secondary to ICM:  HTN: Blood pressure  HLD: LDL  Carotid artery disease: Status post right ICA stenting in 08/2022 with residual 60 to 79% left ICA stenting.  Patient documented to have diffusely small in caliber left vertebral artery with possible severe focal stenosis at the origin of the vessel.  Polysubstance use:   {Are you ordering a CV Procedure (e.g. stress test, cath, DCCV, TEE, etc)?   Press F2        :[id number]     Disposition: F/u with Dr. Rockey Situ or an APP in ***.   Medication Adjustments/Labs and Tests Ordered: Current medicines are reviewed at length with the patient today.  Concerns regarding medicines are outlined above. Medication changes, Labs and Tests ordered today are summarized above and listed in the Patient Instructions accessible in Encounters.   Signed, Christell Faith, PA-C 10/07/2022 2:10 PM     [address] [place], [place] [address] [phone]

## 2022-10-09 ENCOUNTER — Ambulatory Visit: Payer: Medicaid Other | Admitting: Physician Assistant

## 2022-10-19 ENCOUNTER — Telehealth: Payer: Self-pay

## 2022-10-19 ENCOUNTER — Ambulatory Visit: Payer: Medicaid Other | Attending: Physician Assistant

## 2022-10-19 DIAGNOSIS — R55 Syncope and collapse: Secondary | ICD-10-CM

## 2022-10-19 NOTE — Telephone Encounter (Signed)
Patient was ordered a ZIO AT (live readings available in ZioSuite although monitor status marked Lost).  Per email received on 10/19/2022 from [place] [facility] [place]:  This email is to notify you that we received the patient's Zio patch box back; however, the box was empty.  We attempted to contact the patient, but we were unable to reach them - we are unsure if the patient forgot to send the monitor back to [place] or if it fell out during transit.  If you do speak to the patient and they have the patch, we can ship them a replacement return box. If they state they have shipped it back, we advise you re-patch the patient. We apologize for the inconvenience.   [id number]  For more information about other unresolved or pending items, please log in to [facility] or refer to your Zio Action Report, which is sent every Monday and Thursday via email.   Thank you, [place]

## 2022-10-19 NOTE — Addendum Note (Signed)
Addended by: Valora Corporal on: 10/19/2022 03:48 PM   Modules accepted: Orders

## 2022-10-19 NOTE — Telephone Encounter (Signed)
Spoke with patient and he reports mailing the monitor back. They did not receive monitor and advised patient that he will need to wear another one. Order has been placed and being sent to patient. He verbalized understanding with no further questions at this time.

## 2022-10-24 DIAGNOSIS — R55 Syncope and collapse: Secondary | ICD-10-CM | POA: Diagnosis not present

## 2022-10-27 ENCOUNTER — Ambulatory Visit: Payer: Medicaid Other | Admitting: Nurse Practitioner

## 2022-10-27 ENCOUNTER — Encounter: Payer: Self-pay | Admitting: Nurse Practitioner

## 2022-10-27 VITALS — BP 154/68 | HR 83 | Temp 98.2°F | Resp 16 | Ht 63.0 in | Wt 163.0 lb

## 2022-10-27 DIAGNOSIS — G4759 Other parasomnia: Secondary | ICD-10-CM

## 2022-10-27 DIAGNOSIS — J4489 Other specified chronic obstructive pulmonary disease: Secondary | ICD-10-CM

## 2022-10-27 DIAGNOSIS — F17210 Nicotine dependence, cigarettes, uncomplicated: Secondary | ICD-10-CM

## 2022-10-27 DIAGNOSIS — R053 Chronic cough: Secondary | ICD-10-CM | POA: Diagnosis not present

## 2022-10-27 DIAGNOSIS — I2583 Coronary atherosclerosis due to lipid rich plaque: Secondary | ICD-10-CM

## 2022-10-27 DIAGNOSIS — I2510 Atherosclerotic heart disease of native coronary artery without angina pectoris: Secondary | ICD-10-CM

## 2022-10-27 MED ORDER — VENTOLIN HFA 108 (90 BASE) MCG/ACT IN AERS: INHALATION_SPRAY | RESPIRATORY_TRACT | 5 refills | Status: DC

## 2022-10-27 MED ORDER — FLUTICASONE-SALMETEROL 250-50 MCG/ACT IN AEPB: 1.0000 | INHALATION_SPRAY | Freq: Two times a day (BID) | RESPIRATORY_TRACT | 5 refills | Status: DC

## 2022-10-27 NOTE — Progress Notes (Signed)
[facility] [address] [place], [place] [address]  Internal MEDICINE  Office Visit Note  Patient Name: Marcus StakesSteven D [name]  [id number] 10, 2067  [id number]  Date of Service: 10/27/2022  Chief Complaint  Patient presents with   Hypertension   Depression   Follow-up    Need chest x-ray?    HPI Marcus SpareSteven presents for a follow-up visit for  Mild obstructive lung disease -- need updated chest xray  Has had 1 coronary artery stent, right carotid stent  and is going back to get the left carotid stent done soon.  Has a heart monitor on now.   Losing some weight.  Still sleep walking sometimes, woke up in bathtub last time.    Current Medication: Outpatient Encounter Medications as of 10/27/2022  Medication Sig   aspirin 81 MG tablet Take 81 mg by mouth daily.   clopidogrel (PLAVIX) 75 MG tablet Take 1 tablet (75 mg total) by mouth daily.   DULoxetine (CYMBALTA) 30 MG capsule Take 30 mg by mouth daily.   hydrOXYzine (VISTARIL) 25 MG capsule Take 25 mg by mouth 2 (two) times daily.   irbesartan (AVAPRO) 150 MG tablet Take 1 tablet (150 mg total) by mouth daily.   isosorbide mononitrate (IMDUR) 30 MG 24 hr tablet Take 1 tablet (30 mg total) by mouth daily.   metoprolol succinate (TOPROL-XL) 50 MG 24 hr tablet Take 1 tablet (50 mg total) by mouth daily. Take with or immediately following a meal.   nicotine (NICODERM CQ - DOSED IN MG/24 HOURS) 21 mg/24hr patch 21 mg daily.   nitroGLYCERIN (NITROSTAT) 0.4 MG SL tablet SMARTSIG:1 Sublingual Daily   pantoprazole (PROTONIX) 40 MG tablet Take 40 mg by mouth daily.   QUEtiapine (SEROQUEL XR) 50 MG TB24 24 hr tablet Take 50 mg by mouth daily. Take 2 tablets by mouth daily at bedtime   rosuvastatin (CRESTOR) 40 MG tablet Take 40 mg by mouth daily.   [DISCONTINUED] fluticasone-salmeterol (ADVAIR DISKUS) 250-50 MCG/ACT AEPB Inhale 1 puff into the lungs in the morning and at bedtime.   [DISCONTINUED] VENTOLIN HFA 108 (90 Base) MCG/ACT inhaler  SMARTSIG:2 Puff(s) By Mouth Every 4 Hours PRN   fluticasone-salmeterol (ADVAIR DISKUS) 250-50 MCG/ACT AEPB Inhale 1 puff into the lungs in the morning and at bedtime.   VENTOLIN HFA 108 (90 Base) MCG/ACT inhaler SMARTSIG:2 Puff(s) By Mouth Every 4 Hours PRN   No facility-administered encounter medications on file as of 10/27/2022.    Surgical History: Past Surgical History:  Procedure Laterality Date   CARDIAC CATHETERIZATION  06/28/2014   x1 stent   CAROTID PTA/STENT INTERVENTION Right 08/31/2022   Procedure: CAROTID PTA/STENT INTERVENTION;  Surgeon: Annice Needyew, Jason S, MD;  Location: [facility];  Service: Cardiovascular;  Laterality: Right;   COLONOSCOPY N/A 08/12/2022   Procedure: COLONOSCOPY;  Surgeon: Toledo, Boykin Nearingeodoro K, MD;  Location: [facility];  Service: Gastroenterology;  Laterality: N/A;   CORONARY STENT INTERVENTION N/A 08/26/2022   Procedure: CORONARY STENT INTERVENTION;  Surgeon: Yvonne KendallEnd, Christopher, MD;  Location: [facility];  Service: Cardiovascular;  Laterality: N/A;   ESOPHAGOGASTRODUODENOSCOPY N/A 08/12/2022   Procedure: ESOPHAGOGASTRODUODENOSCOPY (EGD);  Surgeon: Toledo, Boykin Nearingeodoro K, MD;  Location: [facility];  Service: Gastroenterology;  Laterality: N/A;   LEFT HEART CATH AND CORONARY ANGIOGRAPHY Left 08/26/2022   Procedure: LEFT HEART CATH AND CORONARY ANGIOGRAPHY;  Surgeon: Yvonne KendallEnd, Christopher, MD;  Location: [facility];  Service: Cardiovascular;  Laterality: Left;   NISSEN FUNDOPLICATION     stomach ulcer surgery  Medical History: Past Medical History:  Diagnosis Date   CAD in native artery    a. 06/28/2014: STEMI, cath LM nl, mLAD 50%, mLCx 20%, OM1 small in size 80%, ramus 100% s/p PCI/DES 0%, EF 40%    Carotid artery disease    Cocaine abuse    Depression    H/O gastric ulcer    Hyperglycemia    a. A1C 5.6%   Hypertension    Ischemic cardiomyopathy    a. EF 40% by cath 06/28/2014   MI (myocardial infarction)    Polysubstance abuse     a. cocaine, tobacco, and alcohol    Tobacco abuse     Family History: Family History  Problem Relation Age of Onset   Heart failure Mother    Stroke Father    Heart failure Father    Prostate cancer Brother    Heart attack Brother     Social History   Socioeconomic History   Marital status: Single    Spouse name: Not on file   Number of children: Not on file   Years of education: Not on file   Highest education level: Not on file  Occupational History   Not on file  Tobacco Use   Smoking status: Every Day    Packs/day: 1.00    Years: 38.00    Additional pack years: 0.00    Total pack years: 38.00    Types: Cigarettes    Start date: 07/21/1975   Smokeless tobacco: Never  Vaping Use   Vaping Use: Never used  Substance and Sexual Activity   Alcohol use: Yes    Comment: every weekend 6-12 beer   Drug use: No    Types: Cocaine, Marijuana    Comment: none in at least 2 years   Sexual activity: Not on file  Other Topics Concern   Not on file  Social History Narrative   Not on file   Social Determinants of Health   Financial Resource Strain: Not on file  Food Insecurity: Not on file  Transportation Needs: Not on file  Physical Activity: Not on file  Stress: Not on file  Social Connections: Not on file  Intimate Partner Violence: Not on file      Review of Systems  Vital Signs: BP (!) 154/68   Pulse 83   Temp 98.2 F (36.8 C)   Resp 16   Ht 5\' 3"  (1.6 m)   Wt 163 lb (73.9 kg)   SpO2 99%   BMI 28.87 kg/m    Physical Exam     Assessment/Plan:   General Counseling: Marcus [name] verbalizes understanding of the findings of todays visit and agrees with plan of treatment. I have discussed any further diagnostic evaluation that may be needed or ordered today. We also reviewed his medications today. he has been encouraged to call the office with any questions or concerns that should arise related to todays visit.    Orders Placed This Encounter   Procedures   DG Chest 2 View    Meds ordered this encounter  Medications   fluticasone-salmeterol (ADVAIR DISKUS) 250-50 MCG/ACT AEPB    Sig: Inhale 1 puff into the lungs in the morning and at bedtime.    Dispense:  60 each    Refill:  5   VENTOLIN HFA 108 (90 Base) MCG/ACT inhaler    Sig: SMARTSIG:2 Puff(s) By Mouth Every 4 Hours PRN    Dispense:  8 g    Refill:  5  Return for please schedule WITH DSK ONLY for next visit for sleep walking in may please. .   Total time spent:*** Minutes Time spent includes review of chart, medications, test results, and follow up plan with the patient.   [place] Controlled Substance Database was reviewed by me.  This patient was seen by Sallyanne Kuster, FNP-C in collaboration with Dr. Beverely Risen as a part of collaborative care agreement.   Analicia Skibinski R. Tedd Sias, MSN, FNP-C Internal medicine

## 2022-10-28 ENCOUNTER — Encounter: Payer: Self-pay | Admitting: Nurse Practitioner

## 2022-11-09 ENCOUNTER — Ambulatory Visit
Admission: RE | Admit: 2022-11-09 | Discharge: 2022-11-09 | Disposition: A | Discharge status: Home / Self Care | Payer: Medicaid Other | Source: Ambulatory Visit | Attending: Nurse Practitioner | Admitting: Nurse Practitioner

## 2022-11-09 ENCOUNTER — Encounter: Payer: Self-pay | Admitting: Medical

## 2022-11-09 ENCOUNTER — Other Ambulatory Visit
Admission: RE | Admit: 2022-11-09 | Discharge: 2022-11-09 | Disposition: A | Discharge status: Home / Self Care | Payer: Medicaid Other | Source: Ambulatory Visit | Attending: Medical | Admitting: Medical

## 2022-11-09 ENCOUNTER — Ambulatory Visit: Payer: Medicaid Other | Attending: Physician Assistant | Admitting: Medical

## 2022-11-09 VITALS — BP 116/74 | HR 84 | Ht 63.0 in | Wt 166.6 lb

## 2022-11-09 DIAGNOSIS — J4489 Other specified chronic obstructive pulmonary disease: Secondary | ICD-10-CM | POA: Insufficient documentation

## 2022-11-09 DIAGNOSIS — R053 Chronic cough: Secondary | ICD-10-CM | POA: Insufficient documentation

## 2022-11-09 DIAGNOSIS — I6521 Occlusion and stenosis of right carotid artery: Secondary | ICD-10-CM | POA: Insufficient documentation

## 2022-11-09 DIAGNOSIS — Z720 Tobacco use: Secondary | ICD-10-CM

## 2022-11-09 DIAGNOSIS — I25110 Atherosclerotic heart disease of native coronary artery with unstable angina pectoris: Secondary | ICD-10-CM

## 2022-11-09 DIAGNOSIS — R55 Syncope and collapse: Secondary | ICD-10-CM

## 2022-11-09 DIAGNOSIS — F17210 Nicotine dependence, cigarettes, uncomplicated: Secondary | ICD-10-CM | POA: Diagnosis present

## 2022-11-09 DIAGNOSIS — I255 Ischemic cardiomyopathy: Secondary | ICD-10-CM

## 2022-11-09 DIAGNOSIS — Z789 Other specified health status: Secondary | ICD-10-CM | POA: Diagnosis not present

## 2022-11-09 DIAGNOSIS — I6523 Occlusion and stenosis of bilateral carotid arteries: Secondary | ICD-10-CM

## 2022-11-09 LAB — CBC
HCT: 43 % (ref 39.0–52.0)
Hemoglobin: 14.3 g/dL (ref 13.0–17.0)
MCH: 31.5 pg (ref 26.0–34.0)
MCHC: 33.3 g/dL (ref 30.0–36.0)
MCV: 94.7 fL (ref 80.0–100.0)
Platelets: 278 10*3/uL (ref 150–400)
RBC: 4.54 MIL/uL (ref 4.22–5.81)
RDW: 13 % (ref 11.5–15.5)
WBC: 6.8 10*3/uL (ref 4.0–10.5)
nRBC: 0 % (ref 0.0–0.2)

## 2022-11-09 MED ORDER — ISOSORBIDE MONONITRATE ER 60 MG PO TB24: 60.0000 mg | ORAL_TABLET | Freq: Every day | ORAL | 3 refills | Status: DC

## 2022-11-09 MED ORDER — IRBESARTAN 75 MG PO TABS: 75.0000 mg | ORAL_TABLET | Freq: Every day | ORAL | 3 refills | Status: DC

## 2022-11-09 NOTE — Progress Notes (Signed)
Cardiology Office Note:    Date:  11/09/2022   ID:  Marcus [name], DOB [birth year], MRN [id number]  PCP:  Levonne Lapping, NP  [facility] Cardiologist:  Julien Nordmann, MD  [facility] Electrophysiologist:  None   Referring MD: Levonne Lapping, NP   Chief Complaint: Hospital follow-up  History of Present Illness:    Marcus [name] is a 57 y.o. male with a hx of CAD with stenting to the RI in 2015, HFrEF secondary to ischemic cardiomyopathy, Carotid artery disease s/p right carotid stenting 08/31/22, polysubstance use including EtOH, tobacco use, cocaine use, hypertension, and hyperlipidemia who presents for hospital follow-up.  Patient was seen January 2424 reporting left-sided chest pain and was set up for heart cath.  He also reported dizziness and syncope carotid ultrasound was ordered.  Carotid US showed 60-79% stenosis in the right internal carotid artery with 80-90% int he left internal carotid artery and left subclavian artery stenosis. STAT CTA head and neck were ordered and the patient was referred to [facility]. STATE CTA head showed severe focal stenosis 80% at the pRCA, and left ICA with 50% stenosis. The patient saw [facility] 08/18/22 and the patient was scheduled for carotid stenting and started on Plavix.    The patient underwent scheduled cardiac cath 08/26/22 showing severe single vessel CAD with 50-60^ ISR of RI stent and 90% de novo lesion just distal to the stent, mild to moderate non-critical disease, mildly elevated LVEDP . The patient was treated with successful PCI and intracoronary lithotripsy to ramus intermedius with reduction of ISR to 20-30% and reduction of de-novo lesion to 0%.    The patient had post-cath chest pain and was given SL NTG x 2 with resolution of pain. EKG showed no changes. He also reported chest pain during the procedure. The patient was kept overnight for observation.  Patient was started on Imdur 30 mg daily at discharge.  Patient underwent  right carotid stenting on 08/31/2022.  Today, the patient reports he has been doing well. He will undergo left carotid stenting in a few weeks. He reports intermittent chest pain. It's in the center of his chest and it's a tightness. He took 2 NTG pills 3 weeks ago. No active chest pain. Last episode was 1 week ago. He just took the monitor off Saturday and mailed it on. He denies missed doses of ASA and Plavix. Cath site is good, left radial.   Past Medical History:  Diagnosis Date   CAD in native artery    a. 06/28/2014: STEMI, cath LM nl, mLAD 50%, mLCx 20%, OM1 small in size 80%, ramus 100% s/p PCI/DES 0%, EF 40%    Carotid artery disease    Cocaine abuse    Depression    H/O gastric ulcer    Hyperglycemia    a. A1C 5.6%   Hypertension    Ischemic cardiomyopathy    a. EF 40% by cath 06/28/2014   MI (myocardial infarction)    Polysubstance abuse    a. cocaine, tobacco, and alcohol    Tobacco abuse     Past Surgical History:  Procedure Laterality Date   CARDIAC CATHETERIZATION  06/28/2014   x1 stent   CAROTID PTA/STENT INTERVENTION Right 08/31/2022   Procedure: CAROTID PTA/STENT INTERVENTION;  Surgeon: Annice Needy, MD;  Location: [facility];  Service: Cardiovascular;  Laterality: Right;   COLONOSCOPY N/A 08/12/2022   Procedure: COLONOSCOPY;  Surgeon: Toledo, Boykin Nearing, MD;  Location: [facility];  Service: Gastroenterology;  Laterality: N/A;   CORONARY STENT INTERVENTION N/A 08/26/2022   Procedure: CORONARY STENT INTERVENTION;  Surgeon: [name], MD;  Location: [facility];  Service: Cardiovascular;  Laterality: N/A;   ESOPHAGOGASTRODUODENOSCOPY N/A 08/12/2022   Procedure: ESOPHAGOGASTRODUODENOSCOPY (EGD);  Surgeon: Toledo, Boykin Nearing, MD;  Location: [facility];  Service: Gastroenterology;  Laterality: N/A;   LEFT HEART CATH AND CORONARY ANGIOGRAPHY Left 08/26/2022   Procedure: LEFT HEART CATH AND CORONARY ANGIOGRAPHY;  Surgeon: [name], MD;   Location: [facility];  Service: Cardiovascular;  Laterality: Left;   NISSEN FUNDOPLICATION     stomach ulcer surgery      Current Medications: Current Meds  Medication Sig   aspirin 81 MG tablet Take 81 mg by mouth daily.   clopidogrel (PLAVIX) 75 MG tablet Take 1 tablet (75 mg total) by mouth daily.   DULoxetine (CYMBALTA) 30 MG capsule Take 30 mg by mouth daily.   fluticasone-salmeterol (ADVAIR DISKUS) 250-50 MCG/ACT AEPB Inhale 1 puff into the lungs in the morning and at bedtime.   hydrOXYzine (VISTARIL) 25 MG capsule Take 25 mg by mouth 2 (two) times daily.   metoprolol succinate (TOPROL-XL) 50 MG 24 hr tablet Take 1 tablet (50 mg total) by mouth daily. Take with or immediately following a meal.   nicotine (NICODERM CQ - DOSED IN MG/24 HOURS) 21 mg/24hr patch 21 mg daily.   pantoprazole (PROTONIX) 40 MG tablet Take 40 mg by mouth daily.   QUEtiapine (SEROQUEL XR) 50 MG TB24 24 hr tablet Take 50 mg by mouth daily. Take 2 tablets by mouth daily at bedtime   rosuvastatin (CRESTOR) 40 MG tablet Take 40 mg by mouth daily.   VENTOLIN HFA 108 (90 Base) MCG/ACT inhaler SMARTSIG:2 Puff(s) By Mouth Every 4 Hours PRN   [DISCONTINUED] irbesartan (AVAPRO) 150 MG tablet Take 1 tablet (150 mg total) by mouth daily.   [DISCONTINUED] isosorbide mononitrate (IMDUR) 30 MG 24 hr tablet Take 1 tablet (30 mg total) by mouth daily.     Allergies:   Oxycodone-acetaminophen and Codeine   Social History   Socioeconomic History   Marital status: Single    Spouse name: Not on file   Number of children: Not on file   Years of education: Not on file   Highest education level: Not on file  Occupational History   Not on file  Tobacco Use   Smoking status: Every Day    Packs/day: 1.00    Years: 38.00    Additional pack years: 0.00    Total pack years: 38.00    Types: Cigarettes    Start date: 07/21/1975   Smokeless tobacco: Never   Tobacco comments:    Pt. Is actively trying to quit and is  wearing the patch.   Vaping Use   Vaping Use: Never used  Substance and Sexual Activity   Alcohol use: Yes    Comment: every weekend 6-12 beer   Drug use: No    Types: Cocaine, Marijuana    Comment: none in at least 2 years   Sexual activity: Not on file  Other Topics Concern   Not on file  Social History Narrative   Not on file   Social Determinants of Health   Financial Resource Strain: Not on file  Food Insecurity: Not on file  Transportation Needs: Not on file  Physical Activity: Not on file  Stress: Not on file  Social Connections: Not on file     Family History: The patient's family history  includes Heart attack in his brother; Heart failure in his father and mother; Prostate cancer in his brother; Stroke in his father.  ROS:   Please see the history of present illness.     All other systems reviewed and are negative.  EKGs/Labs/Other Studies Reviewed:    The following studies were reviewed today:  Cardiac cath 07/2022 Conclusions: Severe single vessel coronary artery disease with 50-60% in-stent restenosis of ramus stent and 90% de-novo lesion just distal to the stent. Mild-moderate, noncritical disease involving the LAD, LCx, and RCA.  There is a 70% stenosis in the apical LAD, which is too small/distal for percutaneous intervention Mildly elevated left ventricular for pressure (LVEDP 19 mmHg). Successful PCI and intracoronary lithotripsy to ramus intermedius with reduction of in-stent restenosis to 20-30% and reduction of de-novo lesion to 0% with TIMI-3 flow.   Recommendations: Overnight observation. Dual antiplatelet therapy with aspirin and clopidogrel for at least 12 months. Aggressive secondary prevention of coronary artery disease.   [name], MD [facility]   Coronary Diagrams  Diagnostic Dominance: Right  Intervention      Echo 07/2022 1. Left ventricular ejection fraction, by estimation, is 55 to 60%. The  left ventricle has  normal function. The left ventricle has no regional  wall motion abnormalities. Left ventricular diastolic parameters are  consistent with Grade II diastolic  dysfunction (pseudonormalization). The average left ventricular global  longitudinal strain is -19.5 %. The global longitudinal strain is normal.   2. Right ventricular systolic function is normal. The right ventricular  size is normal.   3. The mitral valve is normal in structure. Mild mitral valve  regurgitation.   4. The aortic valve is tricuspid. Aortic valve regurgitation is not  visualized.    EKG:  EKG is not ordered today.    Recent Labs: 08/12/2022: ALT 36 09/01/2022: BUN 13; Creatinine, Ser 1.13; Potassium 4.6; Sodium 135 11/09/2022: Hemoglobin 14.3; Platelets 278  Recent Lipid Panel    Component Value Date/Time   CHOL 138 08/12/2022 1540   CHOL 140 06/29/2014 0353   TRIG 143 08/12/2022 1540   TRIG 176 06/29/2014 0353   HDL 61 08/12/2022 1540   HDL 45 06/29/2014 0353   CHOLHDL 2.3 08/12/2022 1540   VLDL 29 08/12/2022 1540   VLDL 35 06/29/2014 0353   LDLCALC 48 08/12/2022 1540   LDLCALC 60 06/29/2014 0353   LDLDIRECT 45 08/12/2022 1540     Physical Exam:    VS:  BP 116/74   Pulse 84   Ht  (1.6 m)   Wt 166 lb 9.6 oz (75.6 kg)   SpO2 97%   BMI 29.51 kg/m     Wt Readings from Last 3 Encounters:  11/09/22 166 lb 9.6 oz (75.6 kg)  10/27/22 163 lb (73.9 kg)  09/29/22 162 lb 3.2 oz (73.6 kg)     GEN:  Well nourished, well developed in no acute distress HEENT: Normal NECK: No JVD; No carotid bruits LYMPHATICS: No lymphadenopathy CARDIAC: RRR, no murmurs, rubs, gallops RESPIRATORY:  Clear to auscultation without rales, wheezing or rhonchi  ABDOMEN: Soft, non-tender, non-distended MUSCULOSKELETAL:  No edema; No deformity  SKIN: Warm and dry NEUROLOGIC:  Alert and oriented x 3 PSYCHIATRIC:  Normal affect   ASSESSMENT:    1. Coronary artery disease involving native coronary artery of native  heart with unstable angina pectoris   2. Bilateral carotid artery stenosis   3. Tobacco use   4. Alcohol use   5. Syncope and collapse  PLAN:    In order of problems listed above:  CAD with recent stenting Recent cath 08/2022 showed ISR treated with PCI to the ramus vessel, He has been on DAPT with ASA and Plavix.  Left radial cath site is stable.  Patient reports persistent unstable angina requiring sublingual nitroglycerin.  He denies missing doses of DAPT. BP 116/74 today. I will decrease Irbesartan and increase Imdur to 60mg  daily. Conitnue Aspirin, Plavix, Toprol and Crestor. He may need Ranexa at follow-up.  I will check a CBC today.  Carotid artery disease Patient is status post stenting on the right with plan to undergo further stenting on the left by vascular surgery. Continue Aspirin, Plavix and Crestor.   Tobacco use Tobacco 1/2 ppd, cessation recommended.  Alcohol use  Reports 6 pack on the weekend  Syncope Initially seen 08/12/22 and heart monitor was ordered. Patient recently mailed in heart monitor. No further syncope reported.   Disposition: Follow up in 1 month(s) with MD/APP    Signed, Damier Disano David Stall, PA-C  11/09/2022 4:34 PM    [facility]

## 2022-11-09 NOTE — Patient Instructions (Addendum)
Medication Instructions:  Your physician has recommended you make the following change in your medication:   START - irbesartan (AVAPRO) 75 MG tablet 75 mg - Take 1 tablet by mouth daily  START - isosorbide mononitrate (IMDUR) 60 MG 24 hr tablet - Take 1 tablet by mouth daily  *If you need a refill on your cardiac medications before your next appointment, please call your pharmacy*  Lab Work: Your physician recommends that you get lab work today: Electrical engineer at [facility] 1st desk on the right to check in (REGISTRATION)  Lab hours: Monday- Friday (7:30 am- 5:30 pm)   If you have labs (blood work) drawn today and your tests are completely normal, you will receive your results only by: MyChart Message (if you have MyChart) OR A paper copy in the mail If you have any lab test that is abnormal or we need to change your treatment, we will call you to review the results.  Testing/Procedures: -None ordered  Follow-Up: At [facility], you and your health needs are our priority.  As part of our continuing mission to provide you with exceptional heart care, we have created designated Provider Care Teams.  These Care Teams include your primary Cardiologist (physician) and Advanced Practice Providers (APPs -  Physician Assistants and Nurse Practitioners) who all work together to provide you with the care you need, when you need it.  We recommend signing up for the patient portal called "MyChart".  Sign up information is provided on this After Visit Summary.  MyChart is used to connect with patients for Virtual Visits (Telemedicine).  Patients are able to view lab/test results, encounter notes, upcoming appointments, etc.  Non-urgent messages can be sent to your provider as well.   To learn more about what you can do with MyChart, go to [email or web address].    Your next appointment:   1 month(s)  Provider:   You may see Julien Nordmann, MD or one of the following Advanced  Practice Providers on your designated Care Team:   Nicolasa Ducking, NP Eula Listen, PA-C Cadence Fransico Michael, PA-C Charlsie Quest, NP    Other Instructions - AMB referral to cardiac rehabilitation

## 2022-12-01 ENCOUNTER — Ambulatory Visit: Payer: Medicaid Other | Admitting: Internal Medicine

## 2022-12-03 ENCOUNTER — Other Ambulatory Visit (INDEPENDENT_AMBULATORY_CARE_PROVIDER_SITE_OTHER): Payer: Self-pay | Admitting: Nurse Practitioner

## 2022-12-03 DIAGNOSIS — I6523 Occlusion and stenosis of bilateral carotid arteries: Secondary | ICD-10-CM

## 2022-12-08 ENCOUNTER — Encounter (INDEPENDENT_AMBULATORY_CARE_PROVIDER_SITE_OTHER): Payer: Self-pay | Admitting: Vascular Surgery

## 2022-12-08 ENCOUNTER — Ambulatory Visit (INDEPENDENT_AMBULATORY_CARE_PROVIDER_SITE_OTHER): Payer: Medicaid Other | Admitting: Vascular Surgery

## 2022-12-08 ENCOUNTER — Ambulatory Visit (INDEPENDENT_AMBULATORY_CARE_PROVIDER_SITE_OTHER): Payer: Medicaid Other

## 2022-12-08 VITALS — BP 177/98 | HR 69 | Resp 16 | Wt 167.0 lb

## 2022-12-08 DIAGNOSIS — F1910 Other psychoactive substance abuse, uncomplicated: Secondary | ICD-10-CM | POA: Diagnosis not present

## 2022-12-08 DIAGNOSIS — I6523 Occlusion and stenosis of bilateral carotid arteries: Secondary | ICD-10-CM

## 2022-12-08 DIAGNOSIS — I2510 Atherosclerotic heart disease of native coronary artery without angina pectoris: Secondary | ICD-10-CM

## 2022-12-08 DIAGNOSIS — I6521 Occlusion and stenosis of right carotid artery: Secondary | ICD-10-CM

## 2022-12-08 DIAGNOSIS — I10 Essential (primary) hypertension: Secondary | ICD-10-CM

## 2022-12-08 NOTE — Assessment & Plan Note (Signed)
His carotid duplex today reveals a widely patent right carotid stent with stable 60 to 79% left ICA stenosis.  Continue dual antiplatelet therapy and statin agent.  Will defer to his primary care physician and cardiologist who he sees later this week in terms of management of his blood pressure, but he sounds like he is having fairly significant symptoms from hypertension with severe headaches and this can worsen a reperfusion type syndrome.  No role for intervention on his left carotid at this point, we will continue to follow this closely.  I will see him back in 3 months with carotid duplex.

## 2022-12-08 NOTE — Progress Notes (Signed)
MRN : [id number]  Marcus [name] is a 57 y.o. ([birth year]) male who presents with chief complaint of  Chief Complaint  Patient presents with   Follow-up    Ultrasound follow up  .  History of Present Illness: Patient returns in follow-up of his carotid disease.  He is about 3-1/2 months status post right carotid stent placement for high-grade stenosis.  He had no periprocedural complications.  He is doing reasonably well but his blood pressure medicines were changed recently and he has been having worsening high blood pressure with headaches over the past few weeks.  No focal neurologic symptoms. Specifically, the patient denies amaurosis fugax, speech or swallowing difficulties, or arm or leg weakness or numbness.  His carotid duplex today reveals a widely patent right carotid stent with stable 60 to 79% left ICA stenosis.  Current Outpatient Medications  Medication Sig Dispense Refill   aspirin 81 MG tablet Take 81 mg by mouth daily.     clopidogrel (PLAVIX) 75 MG tablet Take 1 tablet (75 mg total) by mouth daily. 30 tablet 6   DULoxetine (CYMBALTA) 30 MG capsule Take 30 mg by mouth daily.     fluticasone-salmeterol (ADVAIR DISKUS) 250-50 MCG/ACT AEPB Inhale 1 puff into the lungs in the morning and at bedtime. 60 each 5   hydrOXYzine (VISTARIL) 25 MG capsule Take 25 mg by mouth 2 (two) times daily.     irbesartan (AVAPRO) 75 MG tablet Take 1 tablet (75 mg total) by mouth daily. 90 tablet 3   isosorbide mononitrate (IMDUR) 60 MG 24 hr tablet Take 1 tablet (60 mg total) by mouth daily. 90 tablet 3   metoprolol succinate (TOPROL-XL) 50 MG 24 hr tablet Take 1 tablet (50 mg total) by mouth daily. Take with or immediately following a meal. 90 tablet 3   nicotine (NICODERM CQ - DOSED IN MG/24 HOURS) 21 mg/24hr patch 21 mg daily.     pantoprazole (PROTONIX) 40 MG tablet Take 40 mg by mouth daily.     QUEtiapine (SEROQUEL XR) 50 MG TB24 24 hr tablet Take 50 mg by mouth daily. Take 2 tablets by  mouth daily at bedtime     rosuvastatin (CRESTOR) 40 MG tablet Take 40 mg by mouth daily.     VENTOLIN HFA 108 (90 Base) MCG/ACT inhaler SMARTSIG:2 Puff(s) By Mouth Every 4 Hours PRN 8 g 5   nitroGLYCERIN (NITROSTAT) 0.4 MG SL tablet SMARTSIG:1 Sublingual Daily (Patient not taking: Reported on 11/09/2022)     No current facility-administered medications for this visit.    Past Medical History:  Diagnosis Date   CAD in native artery    a. 06/28/2014: STEMI, cath LM nl, mLAD 50%, mLCx 20%, OM1 small in size 80%, ramus 100% s/p PCI/DES 0%, EF 40%    Carotid artery disease (HCC)    Cocaine abuse (HCC)    Depression    H/O gastric ulcer    Hyperglycemia    a. A1C 5.6%   Hypertension    Ischemic cardiomyopathy    a. EF 40% by cath 06/28/2014   MI (myocardial infarction) (HCC)    Polysubstance abuse (HCC)    a. cocaine, tobacco, and alcohol    Tobacco abuse     Past Surgical History:  Procedure Laterality Date   CARDIAC CATHETERIZATION  06/28/2014   x1 stent   CAROTID PTA/STENT INTERVENTION Right 08/31/2022   Procedure: CAROTID PTA/STENT INTERVENTION;  Surgeon: Annice Needy, MD;  Location: [facility];  Service:  Cardiovascular;  Laterality: Right;   COLONOSCOPY N/A 08/12/2022   Procedure: COLONOSCOPY;  Surgeon: Toledo, Boykin Nearing, MD;  Location: [facility];  Service: Gastroenterology;  Laterality: N/A;   CORONARY STENT INTERVENTION N/A 08/26/2022   Procedure: CORONARY STENT INTERVENTION;  Surgeon: [name], MD;  Location: [facility];  Service: Cardiovascular;  Laterality: N/A;   ESOPHAGOGASTRODUODENOSCOPY N/A 08/12/2022   Procedure: ESOPHAGOGASTRODUODENOSCOPY (EGD);  Surgeon: Toledo, Boykin Nearing, MD;  Location: [facility];  Service: Gastroenterology;  Laterality: N/A;   LEFT HEART CATH AND CORONARY ANGIOGRAPHY Left 08/26/2022   Procedure: LEFT HEART CATH AND CORONARY ANGIOGRAPHY;  Surgeon: [name], MD;  Location: [facility];  Service:  Cardiovascular;  Laterality: Left;   NISSEN FUNDOPLICATION     stomach ulcer surgery       Social History   Tobacco Use   Smoking status: Every Day    Packs/day: 1.00    Years: 38.00    Additional pack years: 0.00    Total pack years: 38.00    Types: Cigarettes    Start date: 07/21/1975   Smokeless tobacco: Never   Tobacco comments:    Pt. Is actively trying to quit and is wearing the patch.   Vaping Use   Vaping Use: Never used  Substance Use Topics   Alcohol use: Yes    Comment: every weekend 6-12 beer   Drug use: No    Types: Cocaine, Marijuana    Comment: none in at least 2 years      Family History  Problem Relation Age of Onset   Heart failure Mother    Stroke Father    Heart failure Father    Prostate cancer Brother    Heart attack Brother      Allergies  Allergen Reactions   Oxycodone-Acetaminophen Other (See Comments)   Codeine Rash    REVIEW OF SYSTEMS (Negative unless checked)   Constitutional: [] Weight loss  [] Fever  [] Chills Cardiac: [] Chest pain   [] Chest pressure   [] Palpitations   [] Shortness of breath when laying flat   [] Shortness of breath at rest   [x] Shortness of breath with exertion. Vascular:  [] Pain in legs with walking   [] Pain in legs at rest   [] Pain in legs when laying flat   [] Claudication   [] Pain in feet when walking  [] Pain in feet at rest  [] Pain in feet when laying flat   [] History of DVT   [] Phlebitis   [] Swelling in legs   [] Varicose veins   [] Non-healing ulcers Pulmonary:   [] Uses home oxygen   [] Productive cough   [] Hemoptysis   [] Wheeze  [] COPD   [] Asthma Neurologic:  [] Dizziness  [x] Blackouts   [] Seizures   [] History of stroke   [] History of TIA  [] Aphasia   [] Temporary blindness   [] Dysphagia   [] Weakness or numbness in arms   [] Weakness or numbness in legs Musculoskeletal:  [x] Arthritis   [] Joint swelling   [] Joint pain   [] Low back pain Hematologic:  [] Easy bruising  [] Easy bleeding   [] Hypercoagulable state   [] Anemic   [] Hepatitis Gastrointestinal:  [] Blood in stool   [] Vomiting blood  [] Gastroesophageal reflux/heartburn   [] Abdominal pain Genitourinary:  [] Chronic kidney disease   [] Difficult urination  [] Frequent urination  [] Burning with urination   [] Hematuria Skin:  [] Rashes   [] Ulcers   [] Wounds Psychological:  [] History of anxiety   []  History of major depression.  Physical Examination  Vitals:   12/08/22 1530  BP: (!) 177/98  Pulse: 69  Resp: 16  Weight: 167 lb (75.8 kg)   Body mass index is 29.58 kg/m. Gen:  WD/WN, NAD Head: [place]/AT, No temporalis wasting. Ear/Nose/Throat: Hearing grossly intact, nares w/o erythema or drainage, trachea midline Eyes: Conjunctiva clear. Sclera non-icteric Neck: Supple.  Left carotid bruit  Pulmonary:  Good air movement, equal and clear to auscultation bilaterally.  Cardiac: RRR, No JVD Vascular:  Vessel Right Left  Radial Palpable Palpable           Musculoskeletal: M/S 5/5 throughout.  No deformity or atrophy. No edema. Neurologic: CN 2-12 intact. Sensation grossly intact in extremities.  Symmetrical.  Speech is fluent. Motor exam as listed above. Psychiatric: Judgment intact, Mood & affect appropriate for pt's clinical situation. Dermatologic: No rashes or ulcers noted.  No cellulitis or open wounds.     CBC Lab Results  Component Value Date   WBC 6.8 11/09/2022   HGB 14.3 11/09/2022   HCT 43.0 11/09/2022   MCV 94.7 11/09/2022   PLT 278 11/09/2022    BMET    Component Value Date/Time   NA 135 09/01/2022 0511   NA 139 06/29/2014 0353   K 4.6 09/01/2022 0511   K 3.6 06/29/2014 0353   CL 106 09/01/2022 0511   CL 107 06/29/2014 0353   CO2 19 (L) 09/01/2022 0511   CO2 25 06/29/2014 0353   GLUCOSE 94 09/01/2022 0511   GLUCOSE 109 (H) 06/29/2014 0353   BUN 13 09/01/2022 0511   BUN 8 06/29/2014 0353   CREATININE 1.13 09/01/2022 0511   CREATININE 0.92 06/29/2014 0353   CALCIUM 8.5 (L) 09/01/2022 0511   CALCIUM 8.6 06/29/2014 0353    GFRNONAA >60 09/01/2022 0511   GFRNONAA >60 06/29/2014 0353   GFRAA >60 10/19/2016 1238   GFRAA >60 06/29/2014 0353   CrCl cannot be calculated (Patient's most recent lab result is older than the maximum 21 days allowed.).  COAG Lab Results  Component Value Date   INR 0.9 06/28/2014    Radiology LONG TERM MONITOR-LIVE TELEMETRY (3-14 DAYS)  Result Date: 11/22/2022 Event monitor Patch Wear Time:  13 days and 13 hours (2024-04-06T18:22:38-0400 to 2024-04-20T08:13:44-0400) Normal sinus rhythm Patient had a min HR of 45 bpm, max HR of 169 bpm, and avg HR of 76 bpm. 1 run of Ventricular Tachycardia occurred lasting 5 beats with a max rate of 169 bpm (avg 156 bpm). 2 Supraventricular Tachycardia runs occurred, the run with the fastest interval lasting 5 beats with a max rate of 117 bpm (avg 105 bpm); the run with the fastest interval was also the longest. Isolated SVEs were rare (<1.0%), SVE Couplets were rare (<1.0%), and no SVE Triplets were present. Isolated VEs were rare (<1.0%, 230), VE Couplets were rare (<1.0%, 4), and VE Triplets were rare (<1.0%, 1). Patient triggered events (19) associated with normal sinus rhythm Signed, Dossie Arbour, MD, Ph.D [facility]  DG Chest 2 View  Result Date: 11/12/2022 CLINICAL DATA:  COPD, chronic cough, SOB EXAM: CHEST - 2 VIEW COMPARISON:  October 19, 2016 FINDINGS: The cardiomediastinal silhouette is unchanged in contour.Mild bronchitic markings bilaterally. No pleural effusion. No pneumothorax. No acute pleuroparenchymal abnormality. Visualized abdomen is unremarkable. Multilevel degenerative changes of the thoracic spine. Similar wedging of a vertebral body of the superior lumbar spine. IMPRESSION: Mild bronchitic markings bilaterally, nonspecific but could reflect bronchitis or reactive airways disease. Given reported smoking history, recommend evaluation for candidacy for annual lung cancer screening CT. Electronically Signed   By: Meda Klinefelter M.D.  On: 11/12/2022 17:01     Assessment/Plan Carotid stenosis His carotid duplex today reveals a widely patent right carotid stent with stable 60 to 79% left ICA stenosis.  Continue dual antiplatelet therapy and statin agent.  Will defer to his primary care physician and cardiologist who he sees later this week in terms of management of his blood pressure, but he sounds like he is having fairly significant symptoms from hypertension with severe headaches and this can worsen a reperfusion type syndrome.  No role for intervention on his left carotid at this point, we will continue to follow this closely.  I will see him back in 3 months with carotid duplex.  CAD in native artery Severe disease.  Follows with cardiology. May be high risk for general anesthesia.   Hypertension blood pressure control important in reducing the progression of atherosclerotic disease. On appropriate oral medications.     Polysubstance abuse Cocaine and tobacco increases risk of recurrent and progressive atherosclerotic vascular disease.  Festus Barren, MD  12/08/2022 4:45 PM    This note was created with Dragon medical transcription system.  Any errors from dictation are purely unintentional

## 2022-12-10 ENCOUNTER — Ambulatory Visit: Payer: Medicaid Other | Attending: Medical | Admitting: Medical

## 2022-12-10 ENCOUNTER — Encounter: Payer: Self-pay | Admitting: Medical

## 2022-12-10 VITALS — BP 160/94 | HR 84 | Ht 63.0 in | Wt 167.4 lb

## 2022-12-10 DIAGNOSIS — Z720 Tobacco use: Secondary | ICD-10-CM | POA: Diagnosis not present

## 2022-12-10 DIAGNOSIS — I10 Essential (primary) hypertension: Secondary | ICD-10-CM

## 2022-12-10 DIAGNOSIS — I6523 Occlusion and stenosis of bilateral carotid arteries: Secondary | ICD-10-CM | POA: Diagnosis not present

## 2022-12-10 DIAGNOSIS — I25110 Atherosclerotic heart disease of native coronary artery with unstable angina pectoris: Secondary | ICD-10-CM

## 2022-12-10 DIAGNOSIS — R55 Syncope and collapse: Secondary | ICD-10-CM

## 2022-12-10 DIAGNOSIS — Z789 Other specified health status: Secondary | ICD-10-CM

## 2022-12-10 MED ORDER — ISOSORBIDE MONONITRATE ER 30 MG PO TB24: 45.0000 mg | ORAL_TABLET | Freq: Every day | ORAL | 3 refills | Status: DC

## 2022-12-10 MED ORDER — IRBESARTAN 150 MG PO TABS: 150.0000 mg | ORAL_TABLET | Freq: Every day | ORAL | 3 refills | Status: DC

## 2022-12-10 NOTE — Progress Notes (Signed)
Cardiology Office Note:    Date:  12/10/2022   ID:  Marcus [name], DOB 10-31-1965, MRN [id number]  PCP:  Levonne Lapping, NP  [facility] Cardiologist:  Julien Nordmann, MD  [facility] Electrophysiologist:  None   Referring MD: Levonne Lapping, NP   Chief Complaint: 1-2 month follow-up  History of Present Illness:    Marcus [name] is a 57 y.o. male with a hx of CAD with stenting to the RI in 2015, HFrEF secondary to ischemic cardiomyopathy, Carotid artery disease s/p right carotid stenting 08/31/22, polysubstance use including EtOH, tobacco use, cocaine use, hypertension, and hyperlipidemia who presents for 1-2 month follow-up.   Patient was seen January 08/2022 reporting left-sided chest pain and was set up for heart cath.  He also reported dizziness and syncope carotid ultrasound was ordered.   Carotid US showed 60-79% stenosis in the right internal carotid artery with 80-90% int he left internal carotid artery and left subclavian artery stenosis. STAT CTA head and neck were ordered and the patient was referred to [facility]. STATE CTA head showed severe focal stenosis 80% at the pRCA, and left ICA with 50% stenosis. The patient saw [facility] 08/18/22 and the patient was scheduled for carotid stenting and started on Plavix.    The patient underwent scheduled cardiac cath 08/26/22 showing severe single vessel CAD with 50-60^ ISR of RI stent and 90% de novo lesion just distal to the stent, mild to moderate non-critical disease, mildly elevated LVEDP . The patient was treated with successful PCI and intracoronary lithotripsy to ramus intermedius with reduction of ISR to 20-30% and reduction of de-novo lesion to 0%.    The patient had post-cath chest pain and was given SL NTG x 2 with resolution of pain. EKG showed no changes. He also reported chest pain during the procedure. The patient was kept overnight for observation.  Patient was started on Imdur 30 mg daily at discharge.   Patient  underwent right carotid stenting on 08/31/2022.  Patient was last seen in April 2024 and was overall doing well.  Plan was to undergo left carotid stenting.  He reported intermittent chest pain requiring nitroglycerin.  Irbesartan was decreased and Imdur was increased.  May need to consider Ranexa at follow-up.  Heart monitor for syncope showed predominantly normal sinus rhythm with an average heart rate of 76 bpm, 1 run of NSVT lasting 5 beats, 2 runs of SVT.  Patient triggered events associated with normal sinus rhythm.  Follow-up carotid duplex showed patent right carotid stent and stable 60-70% in the lect ICA.   Today, the ptient reports he has a headache with increased dose of Imdur. He has an ulcer and is unable to take meds for it. He is OK with decreasing Imdur to 45mg  daily. He denies any more chest pain. BP is high today. No bleeding issues with the ASA and Plavix. He saw [facility] and no plan for left sided carotid stenting.   Past Medical History:  Diagnosis Date   CAD in native artery    a. 06/28/2014: STEMI, cath LM nl, mLAD 50%, mLCx 20%, OM1 small in size 80%, ramus 100% s/p PCI/DES 0%, EF 40%    Carotid artery disease (HCC)    Cocaine abuse (HCC)    Depression    H/O gastric ulcer    Hyperglycemia    a. A1C 5.6%   Hypertension    Ischemic cardiomyopathy    a. EF 40% by cath 06/28/2014   MI (myocardial infarction) (HCC)  Polysubstance abuse (HCC)    a. cocaine, tobacco, and alcohol    Tobacco abuse     Past Surgical History:  Procedure Laterality Date   CARDIAC CATHETERIZATION  06/28/2014   x1 stent   CAROTID PTA/STENT INTERVENTION Right 08/31/2022   Procedure: CAROTID PTA/STENT INTERVENTION;  Surgeon: Annice Needy, MD;  Location: [facility];  Service: Cardiovascular;  Laterality: Right;   COLONOSCOPY N/A 08/12/2022   Procedure: COLONOSCOPY;  Surgeon: Toledo, Boykin Nearing, MD;  Location: [facility];  Service: Gastroenterology;  Laterality: N/A;   CORONARY  STENT INTERVENTION N/A 08/26/2022   Procedure: CORONARY STENT INTERVENTION;  Surgeon: [name], MD;  Location: [facility];  Service: Cardiovascular;  Laterality: N/A;   ESOPHAGOGASTRODUODENOSCOPY N/A 08/12/2022   Procedure: ESOPHAGOGASTRODUODENOSCOPY (EGD);  Surgeon: Toledo, Boykin Nearing, MD;  Location: [facility];  Service: Gastroenterology;  Laterality: N/A;   LEFT HEART CATH AND CORONARY ANGIOGRAPHY Left 08/26/2022   Procedure: LEFT HEART CATH AND CORONARY ANGIOGRAPHY;  Surgeon: [name], MD;  Location: [facility];  Service: Cardiovascular;  Laterality: Left;   NISSEN FUNDOPLICATION     stomach ulcer surgery      Current Medications: Current Meds  Medication Sig   aspirin 81 MG tablet Take 81 mg by mouth daily.   clopidogrel (PLAVIX) 75 MG tablet Take 1 tablet (75 mg total) by mouth daily.   DULoxetine (CYMBALTA) 30 MG capsule Take 30 mg by mouth daily.   fluticasone-salmeterol (ADVAIR DISKUS) 250-50 MCG/ACT AEPB Inhale 1 puff into the lungs in the morning and at bedtime.   hydrOXYzine (VISTARIL) 25 MG capsule Take 25 mg by mouth 2 (two) times daily.   metoprolol succinate (TOPROL-XL) 50 MG 24 hr tablet Take 1 tablet (50 mg total) by mouth daily. Take with or immediately following a meal.   nicotine (NICODERM CQ - DOSED IN MG/24 HOURS) 21 mg/24hr patch 21 mg daily.   nitroGLYCERIN (NITROSTAT) 0.4 MG SL tablet    pantoprazole (PROTONIX) 40 MG tablet Take 40 mg by mouth daily.   QUEtiapine (SEROQUEL XR) 50 MG TB24 24 hr tablet Take 50 mg by mouth daily. Take 2 tablets by mouth daily at bedtime   rosuvastatin (CRESTOR) 40 MG tablet Take 40 mg by mouth daily.   VENTOLIN HFA 108 (90 Base) MCG/ACT inhaler SMARTSIG:2 Puff(s) By Mouth Every 4 Hours PRN   [DISCONTINUED] irbesartan (AVAPRO) 75 MG tablet Take 1 tablet (75 mg total) by mouth daily.   [DISCONTINUED] isosorbide mononitrate (IMDUR) 60 MG 24 hr tablet Take 1 tablet (60 mg total) by mouth daily.      Allergies:   Oxycodone-acetaminophen and Codeine   Social History   Socioeconomic History   Marital status: Single    Spouse name: Not on file   Number of children: Not on file   Years of education: Not on file   Highest education level: Not on file  Occupational History   Not on file  Tobacco Use   Smoking status: Every Day    Packs/day: 1.00    Years: 38.00    Additional pack years: 0.00    Total pack years: 38.00    Types: Cigarettes    Start date: 07/21/1975   Smokeless tobacco: Never   Tobacco comments:    Pt. Is actively trying to quit and is wearing the patch.   Vaping Use   Vaping Use: Never used  Substance and Sexual Activity   Alcohol use: Yes    Comment: every weekend 6-12  beer   Drug use: No    Types: Cocaine, Marijuana    Comment: none in at least 2 years   Sexual activity: Not on file  Other Topics Concern   Not on file  Social History Narrative   Not on file   Social Determinants of Health   Financial Resource Strain: Not on file  Food Insecurity: Not on file  Transportation Needs: Not on file  Physical Activity: Not on file  Stress: Not on file  Social Connections: Not on file     Family History: The patient's family history includes Heart attack in his brother; Heart failure in his father and mother; Prostate cancer in his brother; Stroke in his father.  ROS:   Please see the history of present illness.     All other systems reviewed and are negative.  EKGs/Labs/Other Studies Reviewed:    The following studies were reviewed today:  Heart monitor 11/2022 Event monitor Patch Wear Time:  13 days and 13 hours (2024-04-06T18:22:38-0400 to 2024-04-20T08:13:44-0400)   Normal sinus rhythm Patient had a min HR of 45 bpm, max HR of 169 bpm, and avg HR of 76 bpm.    1 run of Ventricular Tachycardia occurred lasting 5 beats with a max rate of 169 bpm (avg 156 bpm).    2 Supraventricular Tachycardia runs occurred, the run with the fastest  interval lasting 5 beats with a max rate of 117 bpm (avg 105 bpm); the run with the fastest interval was also the longest.    Isolated SVEs were rare (<1.0%), SVE Couplets were rare (<1.0%), and no SVE Triplets were present.    Isolated VEs were rare (<1.0%, 230), VE Couplets were rare (<1.0%, 4), and VE Triplets were rare (<1.0%, 1).    Patient triggered events (19) associated with normal sinus rhythm    Cardiac cath 07/2022 Conclusions: Severe single vessel coronary artery disease with 50-60% in-stent restenosis of ramus stent and 90% de-novo lesion just distal to the stent. Mild-moderate, noncritical disease involving the LAD, LCx, and RCA.  There is a 70% stenosis in the apical LAD, which is too small/distal for percutaneous intervention Mildly elevated left ventricular for pressure (LVEDP 19 mmHg). Successful PCI and intracoronary lithotripsy to ramus intermedius with reduction of in-stent restenosis to 20-30% and reduction of de-novo lesion to 0% with TIMI-3 flow.   Recommendations: Overnight observation. Dual antiplatelet therapy with aspirin and clopidogrel for at least 12 months. Aggressive secondary prevention of coronary artery disease.   [name], MD [facility]   Coronary Diagrams   Diagnostic Dominance: Right  Intervention        Echo 07/2022 1. Left ventricular ejection fraction, by estimation, is 55 to 60%. The  left ventricle has normal function. The left ventricle has no regional  wall motion abnormalities. Left ventricular diastolic parameters are  consistent with Grade II diastolic  dysfunction (pseudonormalization). The average left ventricular global  longitudinal strain is -19.5 %. The global longitudinal strain is normal.   2. Right ventricular systolic function is normal. The right ventricular  size is normal.   3. The mitral valve is normal in structure. Mild mitral valve  regurgitation.   4. The aortic valve is tricuspid. Aortic valve  regurgitation is not  visualized.   EKG:  EKG is not ordered today.    Recent Labs: 08/12/2022: ALT 36 09/01/2022: BUN 13; Creatinine, Ser 1.13; Potassium 4.6; Sodium 135 11/09/2022: Hemoglobin 14.3; Platelets 278  Recent Lipid Panel    Component Value Date/Time  CHOL 138 08/12/2022 1540   CHOL 140 06/29/2014 0353   TRIG 143 08/12/2022 1540   TRIG 176 06/29/2014 0353   HDL 61 08/12/2022 1540   HDL 45 06/29/2014 0353   CHOLHDL 2.3 08/12/2022 1540   VLDL 29 08/12/2022 1540   VLDL 35 06/29/2014 0353   LDLCALC 48 08/12/2022 1540   LDLCALC 60 06/29/2014 0353   LDLDIRECT 45 08/12/2022 1540     Physical Exam:    VS:  BP (!) 160/94 (BP Location: Left Arm, Patient Position: Sitting, Cuff Size: Normal)   Pulse 84   Ht 5\' 3"  (1.6 m)   Wt 167 lb 6.4 oz (75.9 kg)   SpO2 96%   BMI 29.65 kg/m     Wt Readings from Last 3 Encounters:  12/10/22 167 lb 6.4 oz (75.9 kg)  12/08/22 167 lb (75.8 kg)  11/09/22 166 lb 9.6 oz (75.6 kg)     GEN:  Well nourished, well developed in no acute distress HEENT: Normal NECK: No JVD; No carotid bruits LYMPHATICS: No lymphadenopathy CARDIAC: RRR, no murmurs, rubs, gallops RESPIRATORY:  Clear to auscultation without rales, wheezing or rhonchi  ABDOMEN: Soft, non-tender, non-distended MUSCULOSKELETAL:  No edema; No deformity  SKIN: Warm and dry NEUROLOGIC:  Alert and oriented x 3 PSYCHIATRIC:  Normal affect   ASSESSMENT:    1. Coronary artery disease involving native coronary artery of native heart with unstable angina pectoris (HCC)   2. Bilateral carotid artery stenosis   3. Essential hypertension   4. Tobacco use   5. Alcohol use   6. Syncope and collapse    PLAN:    In order of problems listed above:  CAD with recent stenting Patient denies any further chest pain on increased dose of Imdur, however he is having headaches. I will decrease Imdur down to 45 mg daily.  Patient is taking aspirin and Plavix, he denies bleeding issues.   Continue Toprol and Crestor.  I will refer patient to cardiac rehab.  Carotid artery disease  He is status post right carotid stenting. Follow-up duplex showed patent right carotid stent and 60-70% on the left side, plan to watch and wait. Continue ASA, Plavix and statin.  HTN BP is high today. I will increase Irbesartan back to 150mg  daily and decrease Imdur to 45mg  daily due to headaches. Continue Toprol 50mg  daily.   Tobacco use He cut back to 1/4 ppd. He has a patch on.   Alcohol use He is drinking 3-4 beers on the weekend.   Syncope Heart monitor was overall reassuring with no etiology for syncope.  Disposition: Follow up in 3 month(s) with MD/APP    Signed, Tarhonda Hollenberg David Stall, PA-C  12/10/2022 11:20 AM     [facility]

## 2022-12-10 NOTE — Patient Instructions (Signed)
Medication Instructions:   INCREASE Irbesartan - Take one tablet ( 150mg ) by mouth daily.  DECREASE Imdur - Take one and a half tablets (45mg ) by mouth daily.   *If you need a refill on your cardiac medications before your next appointment, please call your pharmacy*   Lab Work:  None Ordered  If you have labs (blood work) drawn today and your tests are completely normal, you will receive your results only by: MyChart Message (if you have MyChart) OR A paper copy in the mail If you have any lab test that is abnormal or we need to change your treatment, we will call you to review the results.   Testing/Procedures:  None Ordered   Follow-Up: At [facility], you and your health needs are our priority.  As part of our continuing mission to provide you with exceptional heart care, we have created designated Provider Care Teams.  These Care Teams include your primary Cardiologist (physician) and Advanced Practice Providers (APPs -  Physician Assistants and Nurse Practitioners) who all work together to provide you with the care you need, when you need it.  We recommend signing up for the patient portal called "MyChart".  Sign up information is provided on this After Visit Summary.  MyChart is used to connect with patients for Virtual Visits (Telemedicine).  Patients are able to view lab/test results, encounter notes, upcoming appointments, etc.  Non-urgent messages can be sent to your provider as well.   To learn more about what you can do with MyChart, go to [email or web address].    Your next appointment:   3 month(s)  Provider:   You may see Julien Nordmann, MD or one of the following Advanced Practice Providers on your designated Care Team:   Nicolasa Ducking, NP Eula Listen, PA-C Cadence Fransico Michael, PA-C Charlsie Quest, NP

## 2022-12-15 ENCOUNTER — Other Ambulatory Visit: Payer: Self-pay | Admitting: *Deleted

## 2022-12-15 ENCOUNTER — Ambulatory Visit: Payer: Medicaid Other | Admitting: Internal Medicine

## 2022-12-15 DIAGNOSIS — Z955 Presence of coronary angioplasty implant and graft: Secondary | ICD-10-CM

## 2022-12-17 ENCOUNTER — Ambulatory Visit: Payer: Medicaid Other | Admitting: Internal Medicine

## 2023-01-08 ENCOUNTER — Telehealth: Payer: Self-pay | Admitting: Nurse Practitioner

## 2023-01-08 NOTE — Telephone Encounter (Signed)
Received Disability Determination service forms. Faxed; 35 pages [phone]

## 2023-01-12 ENCOUNTER — Ambulatory Visit: Payer: Medicaid Other | Admitting: Internal Medicine

## 2023-01-12 ENCOUNTER — Encounter: Payer: Self-pay | Admitting: Internal Medicine

## 2023-01-12 ENCOUNTER — Telehealth: Payer: Self-pay | Admitting: Internal Medicine

## 2023-01-12 VITALS — BP 160/100 | HR 97 | Temp 98.3°F | Resp 16 | Ht 63.0 in | Wt 171.2 lb

## 2023-01-12 DIAGNOSIS — G4759 Other parasomnia: Secondary | ICD-10-CM

## 2023-01-12 DIAGNOSIS — F17210 Nicotine dependence, cigarettes, uncomplicated: Secondary | ICD-10-CM

## 2023-01-12 DIAGNOSIS — G4719 Other hypersomnia: Secondary | ICD-10-CM | POA: Diagnosis not present

## 2023-01-12 DIAGNOSIS — I2583 Coronary atherosclerosis due to lipid rich plaque: Secondary | ICD-10-CM

## 2023-01-12 DIAGNOSIS — F17200 Nicotine dependence, unspecified, uncomplicated: Secondary | ICD-10-CM

## 2023-01-12 DIAGNOSIS — I2510 Atherosclerotic heart disease of native coronary artery without angina pectoris: Secondary | ICD-10-CM

## 2023-01-12 DIAGNOSIS — J4489 Other specified chronic obstructive pulmonary disease: Secondary | ICD-10-CM

## 2023-01-12 NOTE — Telephone Encounter (Signed)
Awaiting 01/12/23 office notes for SS order-[facility]

## 2023-01-12 NOTE — Progress Notes (Unsigned)
[facility] [address] [place], [place] [address]  Pulmonary Sleep Medicine   Office Visit Note  Patient Name: Marcus [name] DOB: 08-16-1965 MRN [id number]  Date of Service: 01/12/2023  Complaints/HPI: He has been having sleep disturbances. He states he feels tired. He also has been tired during the daytime. He does snore very loud. States he talks in his sleep. Patient has been noted to stop breathing. He is a smoker. He smokes about 1/4 PPD but was smoking 3PPD at peak. Patient does exhibit shortness of breath during exertion and also when sleeping. He states he feels like he is smothering and also feels like he is choking at night. Headaches are noted. He also has had PFT done MILD COPD. Currently he is taking inhalers as prescribed. He states had does also have cough.   Office Spirometry Results:     ROS  General: (-) fever, (-) chills, (-) night sweats, (-) weakness Skin: (-) rashes, (-) itching,. Eyes: (-) visual changes, (-) redness, (-) itching. Nose and Sinuses: (-) nasal stuffiness or itchiness, (-) postnasal drip, (-) nosebleeds, (-) sinus trouble. Mouth and Throat: (-) sore throat, (-) hoarseness. Neck: (-) swollen glands, (-) enlarged thyroid, (-) neck pain. Respiratory: + cough, (-) bloody sputum, + shortness of breath, + wheezing. Cardiovascular: + ankle swelling, (-) chest pain. Lymphatic: (-) lymph node enlargement. Neurologic: (-) numbness, (-) tingling. Psychiatric: (-) anxiety, (-) depression   Current Medication: Outpatient Encounter Medications as of 01/12/2023  Medication Sig Note   aspirin 81 MG tablet Take 81 mg by mouth daily.    clopidogrel (PLAVIX) 75 MG tablet Take 1 tablet (75 mg total) by mouth daily.    DULoxetine (CYMBALTA) 30 MG capsule Take 30 mg by mouth daily.    fluticasone-salmeterol (ADVAIR DISKUS) 250-50 MCG/ACT AEPB Inhale 1 puff into the lungs in the morning and at bedtime.    hydrOXYzine (VISTARIL) 25 MG capsule  Take 25 mg by mouth 2 (two) times daily.    irbesartan (AVAPRO) 150 MG tablet Take 1 tablet (150 mg total) by mouth daily.    isosorbide mononitrate (IMDUR) 30 MG 24 hr tablet Take 1.5 tablets (45 mg total) by mouth daily.    metoprolol succinate (TOPROL-XL) 50 MG 24 hr tablet Take 1 tablet (50 mg total) by mouth daily. Take with or immediately following a meal.    nicotine (NICODERM CQ - DOSED IN MG/24 HOURS) 21 mg/24hr patch 21 mg daily.    nitroGLYCERIN (NITROSTAT) 0.4 MG SL tablet  11/09/2022: Pt. Took 2, 3 weeks ago   pantoprazole (PROTONIX) 40 MG tablet Take 40 mg by mouth daily.    QUEtiapine (SEROQUEL XR) 50 MG TB24 24 hr tablet Take 100 mg by mouth daily. Take 2 tablets by mouth daily at bedtime    rosuvastatin (CRESTOR) 40 MG tablet Take 40 mg by mouth daily.    VENTOLIN HFA 108 (90 Base) MCG/ACT inhaler SMARTSIG:2 Puff(s) By Mouth Every 4 Hours PRN    No facility-administered encounter medications on file as of 01/12/2023.    Surgical History: Past Surgical History:  Procedure Laterality Date   CARDIAC CATHETERIZATION  06/28/2014   x1 stent   CAROTID PTA/STENT INTERVENTION Right 08/31/2022   Procedure: CAROTID PTA/STENT INTERVENTION;  Surgeon: Annice Needy, MD;  Location: [facility] INVASIVE CV LAB;  Service: Cardiovascular;  Laterality: Right;   COLONOSCOPY N/A 08/12/2022   Procedure: COLONOSCOPY;  Surgeon: Toledo, Boykin Nearing, MD;  Location: [facility] ENDOSCOPY;  Service: Gastroenterology;  Laterality: N/A;  CORONARY STENT INTERVENTION N/A 08/26/2022   Procedure: CORONARY STENT INTERVENTION;  Surgeon: [name], MD;  Location: [facility] INVASIVE CV LAB;  Service: Cardiovascular;  Laterality: N/A;   ESOPHAGOGASTRODUODENOSCOPY N/A 08/12/2022   Procedure: ESOPHAGOGASTRODUODENOSCOPY (EGD);  Surgeon: Toledo, Boykin Nearing, MD;  Location: [facility] ENDOSCOPY;  Service: Gastroenterology;  Laterality: N/A;   LEFT HEART CATH AND CORONARY ANGIOGRAPHY Left 08/26/2022   Procedure: LEFT HEART CATH AND CORONARY  ANGIOGRAPHY;  Surgeon: [name], MD;  Location: [facility] INVASIVE CV LAB;  Service: Cardiovascular;  Laterality: Left;   NISSEN FUNDOPLICATION     stomach ulcer surgery      Medical History: Past Medical History:  Diagnosis Date   CAD in native artery    a. 06/28/2014: STEMI, cath LM nl, mLAD 50%, mLCx 20%, OM1 small in size 80%, ramus 100% s/p PCI/DES 0%, EF 40%    Carotid artery disease (HCC)    Cocaine abuse (HCC)    Depression    H/O gastric ulcer    Hyperglycemia    a. A1C 5.6%   Hypertension    Ischemic cardiomyopathy    a. EF 40% by cath 06/28/2014   MI (myocardial infarction) (HCC)    Polysubstance abuse (HCC)    a. cocaine, tobacco, and alcohol    Tobacco abuse     Family History: Family History  Problem Relation Age of Onset   Heart failure Mother    Stroke Father    Heart failure Father    Prostate cancer Brother    Heart attack Brother     Social History: Social History   Socioeconomic History   Marital status: Single    Spouse name: Not on file   Number of children: Not on file   Years of education: Not on file   Highest education level: Not on file  Occupational History   Not on file  Tobacco Use   Smoking status: Every Day    Packs/day: 1.00    Years: 38.00    Additional pack years: 0.00    Total pack years: 38.00    Types: Cigarettes    Start date: 07/21/1975   Smokeless tobacco: Never   Tobacco comments:    Pt. Is actively trying to quit and is wearing the patch.   Vaping Use   Vaping Use: Never used  Substance and Sexual Activity   Alcohol use: Yes    Comment: every weekend 6-12 beer   Drug use: No    Types: Cocaine, Marijuana    Comment: none in at least 2 years   Sexual activity: Not on file  Other Topics Concern   Not on file  Social History Narrative   Not on file   Social Determinants of Health   Financial Resource Strain: Not on file  Food Insecurity: Not on file  Transportation Needs: Not on file  Physical  Activity: Not on file  Stress: Not on file  Social Connections: Not on file  Intimate Partner Violence: Not on file    Vital Signs: Blood pressure (!) 160/100, pulse 97, temperature 98.3 F (36.8 C), resp. rate 16, height 5\' 3"  (1.6 m), weight 171 lb 3.2 oz (77.7 kg), SpO2 97 %.  Examination: General Appearance: The patient is well-developed, well-nourished, and in no distress. Skin: Gross inspection of skin unremarkable. Head: normocephalic, no gross deformities. Eyes: no gross deformities noted. ENT: ears appear grossly normal no exudates. Neck: Supple. No thyromegaly. No LAD. Respiratory: few rhonchi noted bilaterally. Cardiovascular: Normal S1 and S2 without murmur  or rub. Extremities: No cyanosis. pulses are equal. Neurologic: Alert and oriented. No involuntary movements.  LABS: Recent Results (from the past 2160 hour(s))  CBC     Status: None   Collection Time: 11/09/22  2:46 PM  Result Value Ref Range   WBC 6.8 4.0 - 10.5 K/uL   RBC 4.54 4.22 - 5.81 MIL/uL   Hemoglobin 14.3 13.0 - 17.0 g/dL   HCT 16.1 09.6 - 04.5 %   MCV 94.7 80.0 - 100.0 fL   MCH 31.5 26.0 - 34.0 pg   MCHC 33.3 30.0 - 36.0 g/dL   RDW 40.9 81.1 - 91.4 %   Platelets 278 150 - 400 K/uL   nRBC 0.0 0.0 - 0.2 %    Comment: Performed at [facility], [address], [place], [place] [address]    Radiology: DG Chest 2 View  Result Date: 11/12/2022 CLINICAL DATA:  COPD, chronic cough, SOB EXAM: CHEST - 2 VIEW COMPARISON:  October 19, 2016 FINDINGS: The cardiomediastinal silhouette is unchanged in contour.Mild bronchitic markings bilaterally. No pleural effusion. No pneumothorax. No acute pleuroparenchymal abnormality. Visualized abdomen is unremarkable. Multilevel degenerative changes of the thoracic spine. Similar wedging of a vertebral body of the superior lumbar spine. IMPRESSION: Mild bronchitic markings bilaterally, nonspecific but could reflect bronchitis or reactive airways disease. Given  reported smoking history, recommend evaluation for candidacy for annual lung cancer screening CT. Electronically Signed   By: Meda Klinefelter M.D.   On: 11/12/2022 17:01    No results found.  No results found.  Assessment and Plan: Patient Active Problem List   Diagnosis Date Noted   Carotid stenosis, right 08/31/2022   Coronary artery disease 08/27/2022   Hyperlipidemia 08/27/2022   Unstable angina (HCC) 08/26/2022   Carotid stenosis 08/18/2022   Substance induced mood disorder (HCC) 10/19/2016   Cocaine abuse (HCC) 10/19/2016   Dysthymia 10/19/2016   Hypertension    CAD in native artery    Ischemic cardiomyopathy    Polysubstance abuse (HCC)    Depression    Hyperglycemia     1. Other hypersomnia Patient needs an in lab sleep study due to history of CAD as well as COPD and is likely to have severe oxygen desaturations due to the COPD. Also of concern is arrhythmias due to CAD and COPD - PSG Sleep Study; Future  2. Other parasomnia This may be incidental to the sleep apnea if it is present.  Will get this worked up further and hopefully if anything is present we will be able to see it during the sleep study.  3. Cigarette smoker motivated to quit He needs to stop smoking we have discussed different pathways to smoking cessation he is working on it actively  4. Obstructive chronic bronchitis without exacerbation Would recommend pulmonary function workup we will discuss further with primary team  5. Coronary artery disease due to lipid rich plaque He does have coronary plaque formation likely is related to history of smoking and activity levels may need ongoing follow-up with cardiology     General Counseling: I have discussed the findings of the evaluation and examination with [name].  I have also discussed any further diagnostic evaluation thatmay be needed or ordered today. Beverly verbalizes understanding of the findings of todays visit. We also reviewed his  medications today and discussed drug interactions and side effects including but not limited excessive drowsiness and altered mental states. We also discussed that there is always a risk not just to him but also  people around him. he has been encouraged to call the office with any questions or concerns that should arise related to todays visit.  No orders of the defined types were placed in this encounter.    Time spent: 62  I have personally obtained a history, examined the patient, evaluated laboratory and imaging results, formulated the assessment and plan and placed orders.    Yevonne Pax, MD [facility] Pulmonary and Critical Care Sleep medicine

## 2023-01-12 NOTE — Patient Instructions (Signed)

## 2023-01-15 ENCOUNTER — Telehealth: Payer: Self-pay | Admitting: Internal Medicine

## 2023-01-15 NOTE — Telephone Encounter (Signed)
SS order placed in [facility] folder-Toni 

## 2023-02-03 ENCOUNTER — Telehealth: Payer: Self-pay | Admitting: Internal Medicine

## 2023-02-03 NOTE — Telephone Encounter (Signed)
SS appointment 02/11/2023 @ [facility]

## 2023-02-11 ENCOUNTER — Telehealth: Payer: Self-pay | Admitting: Internal Medicine

## 2023-02-11 NOTE — Telephone Encounter (Signed)
Per [facility], patient lvm to cancel today's ss. I s/w patient, he stated he did not have transportation today. Does not want to r/s. Doesn't want to have ss done after all- Sheralyn Boatman

## 2023-03-08 ENCOUNTER — Other Ambulatory Visit (INDEPENDENT_AMBULATORY_CARE_PROVIDER_SITE_OTHER): Payer: Self-pay | Admitting: Vascular Surgery

## 2023-03-08 DIAGNOSIS — I6523 Occlusion and stenosis of bilateral carotid arteries: Secondary | ICD-10-CM

## 2023-03-09 ENCOUNTER — Ambulatory Visit (INDEPENDENT_AMBULATORY_CARE_PROVIDER_SITE_OTHER): Payer: Medicaid Other | Admitting: Nurse Practitioner

## 2023-03-09 ENCOUNTER — Encounter (INDEPENDENT_AMBULATORY_CARE_PROVIDER_SITE_OTHER): Payer: Medicaid Other

## 2023-03-10 ENCOUNTER — Encounter (INDEPENDENT_AMBULATORY_CARE_PROVIDER_SITE_OTHER): Payer: Medicaid Other

## 2023-03-10 ENCOUNTER — Ambulatory Visit (INDEPENDENT_AMBULATORY_CARE_PROVIDER_SITE_OTHER): Payer: Medicaid Other | Admitting: Nurse Practitioner

## 2023-03-12 ENCOUNTER — Encounter: Payer: Self-pay | Admitting: Medical

## 2023-03-12 ENCOUNTER — Ambulatory Visit: Payer: Medicaid Other | Attending: Medical | Admitting: Medical

## 2023-03-12 VITALS — BP 150/82 | HR 74 | Ht 63.0 in | Wt 176.1 lb

## 2023-03-12 DIAGNOSIS — I25110 Atherosclerotic heart disease of native coronary artery with unstable angina pectoris: Secondary | ICD-10-CM

## 2023-03-12 DIAGNOSIS — I10 Essential (primary) hypertension: Secondary | ICD-10-CM

## 2023-03-12 DIAGNOSIS — Z720 Tobacco use: Secondary | ICD-10-CM

## 2023-03-12 DIAGNOSIS — Z789 Other specified health status: Secondary | ICD-10-CM

## 2023-03-12 DIAGNOSIS — I5020 Unspecified systolic (congestive) heart failure: Secondary | ICD-10-CM

## 2023-03-12 DIAGNOSIS — I255 Ischemic cardiomyopathy: Secondary | ICD-10-CM

## 2023-03-12 MED ORDER — METOPROLOL SUCCINATE ER 50 MG PO TB24: 75.0000 mg | ORAL_TABLET | Freq: Every day | ORAL | 3 refills | Status: DC

## 2023-03-12 NOTE — Patient Instructions (Signed)
Medication Instructions:  Your physician has recommended you make the following change in your medication:   START - metoprolol succinate (TOPROL-XL) 50 MG 24 hr tablet - Take 1.5 tablets (75 mg total) by mouth daily. Take with or immediately following a meal.  *If you need a refill on your cardiac medications before your next appointment, please call your pharmacy*  Lab Work: -None ordered  Testing/Procedures: -None ordered  Follow-Up: At [facility], you and your health needs are our priority.  As part of our continuing mission to provide you with exceptional heart care, we have created designated Provider Care Teams.  These Care Teams include your primary Cardiologist (physician) and Advanced Practice Providers (APPs -  Physician Assistants and Nurse Practitioners) who all work together to provide you with the care you need, when you need it.  Your next appointment:   4 month(s)  Provider:   Terrilee Croak, PA-C    Other Instructions -None

## 2023-03-12 NOTE — Progress Notes (Signed)
Cardiology Office Note:    Date:  03/12/2023   ID:  Marcus [name], DOB 06-05-1966, MRN [id number]  PCP:  Levonne Lapping, NP  [facility] Cardiologist:  Julien Nordmann, MD  [facility] Electrophysiologist:  None   Referring MD: Levonne Lapping, NP   Chief Complaint: 3 month follow-up  History of Present Illness:    Marcus [name] is a 57 y.o. male with a hx of CAD with stenting to the RI in 2015, HFrEF secondary to ischemic cardiomyopathy, Carotid artery disease s/p right carotid stenting 08/31/22, polysubstance use including EtOH, tobacco use, cocaine use, hypertension, and hyperlipidemia who presents for follow-up.   Patient was seen January 08/2022 reporting left-sided chest pain and was set up for heart cath.  He also reported dizziness and syncope carotid ultrasound was ordered.   Carotid US showed 60-79% stenosis in the right internal carotid artery with 80-90% int he left internal carotid artery and left subclavian artery stenosis. STAT CTA head and neck were ordered and the patient was referred to [facility]. STATE CTA head showed severe focal stenosis 80% at the pRCA, and left ICA with 50% stenosis. The patient saw [facility] 08/18/22 and the patient was scheduled for carotid stenting and started on Plavix.    The patient underwent scheduled cardiac cath 08/26/22 showing severe single vessel CAD with 50-60^ ISR of RI stent and 90% de novo lesion just distal to the stent, mild to moderate non-critical disease, mildly elevated LVEDP . The patient was treated with successful PCI and intracoronary lithotripsy to ramus intermedius with reduction of ISR to 20-30% and reduction of de-novo lesion to 0%. The patient had post-cath chest pain and was given SL NTG x 2 with resolution of pain. EKG showed no changes. He also reported chest pain during the procedure. The patient was kept overnight for observation.  Patient was started on Imdur 30 mg daily at discharge.   Patient underwent right  carotid stenting on 08/31/2022.   Patient was seen in April 2024 and was overall doing well. He reported intermittent chest pain requiring nitroglycerin.  Irbesartan was decreased and Imdur was increased.  May need to consider Ranexa at follow-up.   Heart monitor for syncope showed predominantly normal sinus rhythm with an average heart rate of 76 bpm, 1 run of NSVT lasting 5 beats, 2 runs of SVT.  Patient triggered events associated with normal sinus rhythm.   Follow-up carotid duplex showed patent right carotid stent and stable 60-70% in the lect ICA.   The patient was last seen 12/10/22 reporting headache with increased does of Imdur and this was decreased.  Patient was referred back to cardiac rehab.  Today, the patient reports he has been doing OK. He had a couple episodes of chest pain while sitting. He didn't take SL NTG. Patient is still smoking 1/4 ppd. Still drinking alcohol 3-4 beers on the weekend,    Past Medical History:  Diagnosis Date   CAD in native artery    a. 06/28/2014: STEMI, cath LM nl, mLAD 50%, mLCx 20%, OM1 small in size 80%, ramus 100% s/p PCI/DES 0%, EF 40%    Carotid artery disease (HCC)    Cocaine abuse (HCC)    Depression    H/O gastric ulcer    Hyperglycemia    a. A1C 5.6%   Hypertension    Ischemic cardiomyopathy    a. EF 40% by cath 06/28/2014   MI (myocardial infarction) (HCC)    Polysubstance abuse (HCC)    a.  cocaine, tobacco, and alcohol    Tobacco abuse     Past Surgical History:  Procedure Laterality Date   CARDIAC CATHETERIZATION  06/28/2014   x1 stent   CAROTID PTA/STENT INTERVENTION Right 08/31/2022   Procedure: CAROTID PTA/STENT INTERVENTION;  Surgeon: Annice Needy, MD;  Location: [facility];  Service: Cardiovascular;  Laterality: Right;   COLONOSCOPY N/A 08/12/2022   Procedure: COLONOSCOPY;  Surgeon: Toledo, Boykin Nearing, MD;  Location: [facility];  Service: Gastroenterology;  Laterality: N/A;   CORONARY STENT INTERVENTION  N/A 08/26/2022   Procedure: CORONARY STENT INTERVENTION;  Surgeon: [name], MD;  Location: [facility];  Service: Cardiovascular;  Laterality: N/A;   ESOPHAGOGASTRODUODENOSCOPY N/A 08/12/2022   Procedure: ESOPHAGOGASTRODUODENOSCOPY (EGD);  Surgeon: Toledo, Boykin Nearing, MD;  Location: [facility];  Service: Gastroenterology;  Laterality: N/A;   LEFT HEART CATH AND CORONARY ANGIOGRAPHY Left 08/26/2022   Procedure: LEFT HEART CATH AND CORONARY ANGIOGRAPHY;  Surgeon: [name], MD;  Location: [facility];  Service: Cardiovascular;  Laterality: Left;   NISSEN FUNDOPLICATION     stomach ulcer surgery      Current Medications: Current Meds  Medication Sig   aspirin 81 MG tablet Take 81 mg by mouth daily.   clopidogrel (PLAVIX) 75 MG tablet Take 1 tablet (75 mg total) by mouth daily.   DULoxetine (CYMBALTA) 30 MG capsule Take 30 mg by mouth daily.   fluticasone-salmeterol (ADVAIR DISKUS) 250-50 MCG/ACT AEPB Inhale 1 puff into the lungs in the morning and at bedtime.   hydrOXYzine (VISTARIL) 25 MG capsule Take 25 mg by mouth 2 (two) times daily.   irbesartan (AVAPRO) 150 MG tablet Take 1 tablet (150 mg total) by mouth daily.   isosorbide mononitrate (IMDUR) 30 MG 24 hr tablet Take 1.5 tablets (45 mg total) by mouth daily.   nicotine (NICODERM CQ - DOSED IN MG/24 HOURS) 21 mg/24hr patch 21 mg daily.   nitroGLYCERIN (NITROSTAT) 0.4 MG SL tablet    pantoprazole (PROTONIX) 40 MG tablet Take 40 mg by mouth daily.   QUEtiapine (SEROQUEL XR) 50 MG TB24 24 hr tablet Take 100 mg by mouth daily. Take 2 tablets by mouth daily at bedtime   rosuvastatin (CRESTOR) 40 MG tablet Take 40 mg by mouth daily.   VENTOLIN HFA 108 (90 Base) MCG/ACT inhaler SMARTSIG:2 Puff(s) By Mouth Every 4 Hours PRN   [DISCONTINUED] metoprolol succinate (TOPROL-XL) 50 MG 24 hr tablet Take 1 tablet (50 mg total) by mouth daily. Take with or immediately following a meal.     Allergies:    Oxycodone-acetaminophen and Codeine   Social History   Socioeconomic History   Marital status: Single    Spouse name: Not on file   Number of children: Not on file   Years of education: Not on file   Highest education level: Not on file  Occupational History   Not on file  Tobacco Use   Smoking status: Every Day    Current packs/day: 1.00    Average packs/day: 1 pack/day for 47.6 years (47.6 ttl pk-yrs)    Types: Cigarettes    Start date: 07/21/1975   Smokeless tobacco: Never   Tobacco comments:    Pt. Is actively trying to quit and is wearing the patch.   Vaping Use   Vaping status: Never Used  Substance and Sexual Activity   Alcohol use: Yes    Comment: every weekend 6-12 beer   Drug use: No    Types: Cocaine, Marijuana  Comment: none in at least 2 years   Sexual activity: Not on file  Other Topics Concern   Not on file  Social History Narrative   Not on file   Social Determinants of Health   Financial Resource Strain: Not on file  Food Insecurity: Not on file  Transportation Needs: Not on file  Physical Activity: Not on file  Stress: Not on file  Social Connections: Not on file     Family History: The patient's family history includes Heart attack in his brother; Heart failure in his father and mother; Prostate cancer in his brother; Stroke in his father.  ROS:   Please see the history of present illness.     All other systems reviewed and are negative.  EKGs/Labs/Other Studies Reviewed:    The following studies were reviewed today:   Heart monitor 11/2022 Event monitor Patch Wear Time:  13 days and 13 hours (2024-04-06T18:22:38-0400 to 2024-04-20T08:13:44-0400)   Normal sinus rhythm Patient had a min HR of 45 bpm, max HR of 169 bpm, and avg HR of 76 bpm.    1 run of Ventricular Tachycardia occurred lasting 5 beats with a max rate of 169 bpm (avg 156 bpm).    2 Supraventricular Tachycardia runs occurred, the run with the fastest interval lasting 5  beats with a max rate of 117 bpm (avg 105 bpm); the run with the fastest interval was also the longest.    Isolated SVEs were rare (<1.0%), SVE Couplets were rare (<1.0%), and no SVE Triplets were present.    Isolated VEs were rare (<1.0%, 230), VE Couplets were rare (<1.0%, 4), and VE Triplets were rare (<1.0%, 1).    Patient triggered events (19) associated with normal sinus rhythm    Cardiac cath 07/2022 Conclusions: Severe single vessel coronary artery disease with 50-60% in-stent restenosis of ramus stent and 90% de-novo lesion just distal to the stent. Mild-moderate, noncritical disease involving the LAD, LCx, and RCA.  There is a 70% stenosis in the apical LAD, which is too small/distal for percutaneous intervention Mildly elevated left ventricular for pressure (LVEDP 19 mmHg). Successful PCI and intracoronary lithotripsy to ramus intermedius with reduction of in-stent restenosis to 20-30% and reduction of de-novo lesion to 0% with TIMI-3 flow.   Recommendations: Overnight observation. Dual antiplatelet therapy with aspirin and clopidogrel for at least 12 months. Aggressive secondary prevention of coronary artery disease.   [name], MD [facility]   Coronary Diagrams   Diagnostic Dominance: Right  Intervention        Echo 07/2022 1. Left ventricular ejection fraction, by estimation, is 55 to 60%. The  left ventricle has normal function. The left ventricle has no regional  wall motion abnormalities. Left ventricular diastolic parameters are  consistent with Grade II diastolic  dysfunction (pseudonormalization). The average left ventricular global  longitudinal strain is -19.5 %. The global longitudinal strain is normal.   2. Right ventricular systolic function is normal. The right ventricular  size is normal.   3. The mitral valve is normal in structure. Mild mitral valve  regurgitation.   4. The aortic valve is tricuspid. Aortic valve regurgitation is not   visualized.     EKG:  EKG is  ordered today.  The ekg ordered today demonstrates NSR 74bpm, RBBB, no changes  Recent Labs: 08/12/2022: ALT 36 09/01/2022: BUN 13; Creatinine, Ser 1.13; Potassium 4.6; Sodium 135 11/09/2022: Hemoglobin 14.3; Platelets 278  Recent Lipid Panel    Component Value Date/Time   CHOL  138 08/12/2022 1540   CHOL 140 06/29/2014 0353   TRIG 143 08/12/2022 1540   TRIG 176 06/29/2014 0353   HDL 61 08/12/2022 1540   HDL 45 06/29/2014 0353   CHOLHDL 2.3 08/12/2022 1540   VLDL 29 08/12/2022 1540   VLDL 35 06/29/2014 0353   LDLCALC 48 08/12/2022 1540   LDLCALC 60 06/29/2014 0353   LDLDIRECT 45 08/12/2022 1540    Physical Exam:    VS:  BP (!) 150/82 (BP Location: Left Arm, Patient Position: Sitting, Cuff Size: Normal)   Pulse 74   Ht 5\' 3"  (1.6 m)   Wt 176 lb 2 oz (79.9 kg)   SpO2 97%   BMI 31.20 kg/m     Wt Readings from Last 3 Encounters:  03/12/23 176 lb 2 oz (79.9 kg)  01/12/23 171 lb 3.2 oz (77.7 kg)  12/10/22 167 lb 6.4 oz (75.9 kg)     GEN:  Well nourished, well developed in no acute distress HEENT: Normal NECK: No JVD; No carotid bruits LYMPHATICS: No lymphadenopathy CARDIAC: RRR, no murmurs, rubs, gallops RESPIRATORY:  Clear to auscultation without rales, wheezing or rhonchi  ABDOMEN: Soft, non-tender, non-distended MUSCULOSKELETAL:  No edema; No deformity  SKIN: Warm and dry NEUROLOGIC:  Alert and oriented x 3 PSYCHIATRIC:  Normal affect   ASSESSMENT:    1. Coronary artery disease involving native coronary artery of native heart with unstable angina pectoris (HCC)   2. Essential hypertension   3. HFrEF (heart failure with reduced ejection fraction) (HCC)   4. Ischemic cardiomyopathy   5. Tobacco use   6. Alcohol use    PLAN:    In order of problems listed above:  CAD s/p PCI/DES in 08/2022 Patient reports intermittent chest pain. He had headaches on higher does of Imdur. I will increase Toprol to 75mg  daily. Continue Aspirin,  Plavix, Toprol, and Crestor. Patient did not want to do cardiac rehab.   Carotid artery disease He is s/p right carotid stenting. Follow-up duplex showed patent right carotid stent and 60-50% on the left side. Continue ASA, plavix and statin.   HTN BP is elevated. Increase Toprol as above. Continue Imdur 45mg  daily, Toprol 75mg  daily and Irbesartan 150mg  daily.   Tobacco use Patient is still smoking 1/4pack per day.  Alcohol use Patient is drinking 3-4 beers on the weekends.  Disposition: Follow up in 4 month(s) with MD/APP    Signed, Niamh Rada David Stall, PA-C  03/12/2023 2:07 PM     [facility]

## 2023-04-20 ENCOUNTER — Ambulatory Visit: Payer: Medicaid Other | Admitting: Internal Medicine

## 2023-04-21 ENCOUNTER — Ambulatory Visit: Payer: Medicaid Other | Attending: Medical | Admitting: Medical

## 2023-04-21 ENCOUNTER — Encounter: Payer: Self-pay | Admitting: Medical

## 2023-04-21 VITALS — BP 166/90 | HR 86 | Ht 63.0 in | Wt 175.6 lb

## 2023-04-21 DIAGNOSIS — Z720 Tobacco use: Secondary | ICD-10-CM

## 2023-04-21 DIAGNOSIS — I10 Essential (primary) hypertension: Secondary | ICD-10-CM | POA: Diagnosis not present

## 2023-04-21 DIAGNOSIS — I25118 Atherosclerotic heart disease of native coronary artery with other forms of angina pectoris: Secondary | ICD-10-CM | POA: Diagnosis not present

## 2023-04-21 DIAGNOSIS — Z789 Other specified health status: Secondary | ICD-10-CM

## 2023-04-21 DIAGNOSIS — I25110 Atherosclerotic heart disease of native coronary artery with unstable angina pectoris: Secondary | ICD-10-CM | POA: Diagnosis not present

## 2023-04-21 DIAGNOSIS — F1090 Alcohol use, unspecified, uncomplicated: Secondary | ICD-10-CM

## 2023-04-21 MED ORDER — NITROGLYCERIN 0.4 MG SL SUBL: 0.4000 mg | SUBLINGUAL_TABLET | SUBLINGUAL | 0 refills | Status: AC | PRN

## 2023-04-21 MED ORDER — ISOSORBIDE MONONITRATE ER 60 MG PO TB24: 60.0000 mg | ORAL_TABLET | Freq: Two times a day (BID) | ORAL | 1 refills | Status: DC

## 2023-04-21 NOTE — Progress Notes (Signed)
Cardiology Office Note:    Date:  04/21/2023   ID:  Marcus [name], DOB [birth year], MRN [id number]  PCP:  Levonne Lapping, NP  [facility] Cardiologist:  Julien Nordmann, MD  [facility] Electrophysiologist:  None   Referring MD: Levonne Lapping, NP   Chief Complaint: 77-month follow-up  History of Present Illness:    Marcus [name] is a 57 y.o. male with a hx of CAD with stenting to the RI in 2015, HFrEF secondary to ischemic cardiomyopathy, Carotid artery disease s/p right carotid stenting 08/31/22, polysubstance use including EtOH, tobacco use, cocaine use, hypertension, and hyperlipidemia who presents for follow-up.   Patient was seen January 08/2022 reporting left-sided chest pain and was set up for heart cath.  He also reported dizziness and syncope carotid ultrasound was ordered.   Carotid US showed 60-79% stenosis in the right internal carotid artery with 80-90% int he left internal carotid artery and left subclavian artery stenosis. STAT CTA head and neck were ordered and the patient was referred to [facility]. STATE CTA head showed severe focal stenosis 80% at the pRCA, and left ICA with 50% stenosis. The patient saw [facility] 08/18/22 and the patient was scheduled for carotid stenting and started on Plavix.    The patient underwent scheduled cardiac cath 08/26/22 showing severe single vessel CAD with 50-60^ ISR of RI stent and 90% de novo lesion just distal to the stent, mild to moderate non-critical disease, mildly elevated LVEDP . The patient was treated with successful PCI and intracoronary lithotripsy to ramus intermedius with reduction of ISR to 20-30% and reduction of de-novo lesion to 0%. The patient had post-cath chest pain and was given SL NTG x 2 with resolution of pain. EKG showed no changes. He also reported chest pain during the procedure. The patient was kept overnight for observation.  Patient was started on Imdur 30 mg daily at discharge.   Patient underwent right  carotid stenting on 08/31/2022. Follow-up carotid duplex showed patent right carotid stent and stable 60-70% in the left ICA. Heart monitor for syncope showed predominantly normal sinus rhythm with an average heart rate of 76 bpm, 1 run of NSVT lasting 5 beats, 2 runs of SVT.  Patient triggered events associated with normal sinus rhythm.  Patient was last seen 03/12/2023 and was overall doing okay.  He reported a couple episodes of chest pain. Toprol was increased. He was still smoking and drinking alcohol.  No further ischemic workup was pursued.  Today, the patient reports chest pain lower left side of the chest. IT's a sharp pain and it goes into the back. Feels like his ribs broke. It can last an hour. He takes SL NTG however this does not seem to help the pain. Pain is similar to prior stenting.  He takes ASA and plavix, he denies missing any doses.  He is still smoking 5 cigarettes daily. No alcohol or drug use.   Past Medical History:  Diagnosis Date   CAD in native artery    a. 06/28/2014: STEMI, cath LM nl, mLAD 50%, mLCx 20%, OM1 small in size 80%, ramus 100% s/p PCI/DES 0%, EF 40%    Carotid artery disease (HCC)    Cocaine abuse (HCC)    Depression    H/O gastric ulcer    Hyperglycemia    a. A1C 5.6%   Hypertension    Ischemic cardiomyopathy    a. EF 40% by cath 06/28/2014   MI (myocardial infarction) (HCC)    Polysubstance abuse (  HCC)    a. cocaine, tobacco, and alcohol    Tobacco abuse     Past Surgical History:  Procedure Laterality Date   CARDIAC CATHETERIZATION  06/28/2014   x1 stent   CAROTID PTA/STENT INTERVENTION Right 08/31/2022   Procedure: CAROTID PTA/STENT INTERVENTION;  Surgeon: Annice Needy, MD;  Location: [facility]ASIVE CV [facility];  Service: Cardiovascular;  Laterality: Right;   COLONOSCOPY N/A 08/12/2022   Procedure: COLONOSCOPY;  Surgeon: Toledo, Boykin Nearing, MD;  Location: [facility] ENDOSCOPY;  Service: Gastroenterology;  Laterality: N/A;   CORONARY STENT INTERVENTION  N/A 08/26/2022   Procedure: CORONARY STENT INTERVENTION;  Surgeon: [name], MD;  Location: [facility] CV [facility];  Service: Cardiovascular;  Laterality: N/A;   ESOPHAGOGASTRODUODENOSCOPY N/A 08/12/2022   Procedure: ESOPHAGOGASTRODUODENOSCOPY (EGD);  Surgeon: Toledo, Boykin Nearing, MD;  Location: [facility] ENDOSCOPY;  Service: Gastroenterology;  Laterality: N/A;   LEFT HEART CATH AND CORONARY ANGIOGRAPHY Left 08/26/2022   Procedure: LEFT HEART CATH AND CORONARY ANGIOGRAPHY;  Surgeon: [name], MD;  Location: [facility];  Service: Cardiovascular;  Laterality: Left;   NISSEN FUNDOPLICATION     stomach ulcer surgery      Current Medications: Current Meds  Medication Sig   aspirin 81 MG tablet Take 81 mg by mouth daily.   clopidogrel (PLAVIX) 75 MG tablet Take 1 tablet (75 mg total) by mouth daily.   DULoxetine (CYMBALTA) 30 MG capsule Take 30 mg by mouth daily.   fluticasone-salmeterol (ADVAIR DISKUS) 250-50 MCG/ACT AEPB Inhale 1 puff into the lungs in the morning and at bedtime.   hydrOXYzine (VISTARIL) 25 MG capsule Take 25 mg by mouth 2 (two) times daily.   irbesartan (AVAPRO) 150 MG tablet Take 1 tablet (150 mg total) by mouth daily.   metoprolol succinate (TOPROL-XL) 50 MG 24 hr tablet Take 1.5 tablets (75 mg total) by mouth daily. Take with or immediately following a meal.   pantoprazole (PROTONIX) 40 MG tablet Take 40 mg by mouth daily.   QUEtiapine (SEROQUEL XR) 50 MG TB24 24 hr tablet Take 100 mg by mouth daily. Take 2 tablets by mouth daily at bedtime   rosuvastatin (CRESTOR) 40 MG tablet Take 40 mg by mouth daily.   VENTOLIN HFA 108 (90 Base) MCG/ACT inhaler SMARTSIG:2 Puff(s) By Mouth Every 4 Hours PRN   [DISCONTINUED] isosorbide mononitrate (IMDUR) 30 MG 24 hr tablet Take 1.5 tablets (45 mg total) by mouth daily.   [DISCONTINUED] nitroGLYCERIN (NITROSTAT) 0.4 MG SL tablet      Allergies:   Oxycodone-acetaminophen and Codeine   Social History   Socioeconomic  History   Marital status: Single    Spouse name: Not on file   Number of children: Not on file   Years of education: Not on file   Highest education level: Not on file  Occupational History   Not on file  Tobacco Use   Smoking status: Every Day    Current packs/day: 1.00    Average packs/day: 1 pack/day for 47.8 years (47.8 ttl pk-yrs)    Types: Cigarettes    Start date: 07/21/1975   Smokeless tobacco: Never   Tobacco comments:    Pt. Is actively trying to quit and is wearing the patch.   Vaping Use   Vaping status: Never Used  Substance and Sexual Activity   Alcohol use: Yes    Comment: every weekend 6-12 beer   Drug use: No    Types: Cocaine, Marijuana    Comment: none in at least 2 years  Sexual activity: Not on file  Other Topics Concern   Not on file  Social History Narrative   Not on file   Social Determinants of Health   Financial Resource Strain: Not on file  Food Insecurity: Not on file  Transportation Needs: Not on file  Physical Activity: Not on file  Stress: Not on file  Social Connections: Not on file     Family History: The patient's family history includes Heart attack in his brother; Heart failure in his father and mother; Prostate cancer in his brother; Stroke in his father.  ROS:   Please see the history of present illness.     All other systems reviewed and are negative.  EKGs/Labs/Other Studies Reviewed:    The following studies were reviewed today:   Heart monitor 11/2022 Event monitor Patch Wear Time:  13 days and 13 hours (2024-04-06T18:22:38-0400 to 2024-04-20T08:13:44-0400)   Normal sinus rhythm Patient had a min HR of 45 bpm, max HR of 169 bpm, and avg HR of 76 bpm.    1 run of Ventricular Tachycardia occurred lasting 5 beats with a max rate of 169 bpm (avg 156 bpm).    2 Supraventricular Tachycardia runs occurred, the run with the fastest interval lasting 5 beats with a max rate of 117 bpm (avg 105 bpm); the run with the fastest  interval was also the longest.    Isolated SVEs were rare (<1.0%), SVE Couplets were rare (<1.0%), and no SVE Triplets were present.    Isolated VEs were rare (<1.0%, 230), VE Couplets were rare (<1.0%, 4), and VE Triplets were rare (<1.0%, 1).    Patient triggered events (19) associated with normal sinus rhythm    Cardiac cath 07/2022 Conclusions: Severe single vessel coronary artery disease with 50-60% in-stent restenosis of ramus stent and 90% de-novo lesion just distal to the stent. Mild-moderate, noncritical disease involving the LAD, LCx, and RCA.  There is a 70% stenosis in the apical LAD, which is too small/distal for percutaneous intervention Mildly elevated left ventricular for pressure (LVEDP 19 mmHg). Successful PCI and intracoronary lithotripsy to ramus intermedius with reduction of in-stent restenosis to 20-30% and reduction of de-novo lesion to 0% with TIMI-3 flow.   Recommendations: Overnight observation. Dual antiplatelet therapy with aspirin and clopidogrel for at least 12 months. Aggressive secondary prevention of coronary artery disease.   [name], MD [facility]   Coronary Diagrams   Diagnostic Dominance: Right  Intervention        Echo 07/2022 1. Left ventricular ejection fraction, by estimation, is 55 to 60%. The  left ventricle has normal function. The left ventricle has no regional  wall motion abnormalities. Left ventricular diastolic parameters are  consistent with Grade II diastolic  dysfunction (pseudonormalization). The average left ventricular global  longitudinal strain is -19.5 %. The global longitudinal strain is normal.   2. Right ventricular systolic function is normal. The right ventricular  size is normal.   3. The mitral valve is normal in structure. Mild mitral valve  regurgitation.   4. The aortic valve is tricuspid. Aortic valve regurgitation is not  visualized.     EKG:  EKG is ordered today.  The ekg ordered today  demonstrates NSR 86bpm, nonspecific ST changes  Recent Labs: 08/12/2022: ALT 36 09/01/2022: BUN 13; Creatinine, Ser 1.13; Potassium 4.6; Sodium 135 11/09/2022: Hemoglobin 14.3; Platelets 278  Recent Lipid Panel    Component Value Date/Time   CHOL 138 08/12/2022 1540   CHOL 140 06/29/2014 0353  TRIG 143 08/12/2022 1540   TRIG 176 06/29/2014 0353   HDL 61 08/12/2022 1540   HDL 45 06/29/2014 0353   CHOLHDL 2.3 08/12/2022 1540   VLDL 29 08/12/2022 1540   VLDL 35 06/29/2014 0353   LDLCALC 48 08/12/2022 1540   LDLCALC 60 06/29/2014 0353   LDLDIRECT 45 08/12/2022 1540    Physical Exam:    VS:  BP (!) 166/90 (BP Location: Left Arm, Patient Position: Sitting, Cuff Size: Normal)   Pulse 86   Ht 5\' 3"  (1.6 m)   Wt 175 lb 9.6 oz (79.7 kg)   SpO2 96%   BMI 31.11 kg/m     Wt Readings from Last 3 Encounters:  04/21/23 175 lb 9.6 oz (79.7 kg)  03/12/23 176 lb 2 oz (79.9 kg)  01/12/23 171 lb 3.2 oz (77.7 kg)     GEN:  Well nourished, well developed in no acute distress HEENT: Normal NECK: No JVD; No carotid bruits LYMPHATICS: No lymphadenopathy CARDIAC: RRR, no murmurs, rubs, gallops RESPIRATORY:  Clear to auscultation without rales, wheezing or rhonchi  ABDOMEN: Soft, non-tender, non-distended MUSCULOSKELETAL:  No edema; No deformity  SKIN: Warm and dry NEUROLOGIC:  Alert and oriented x 3 PSYCHIATRIC:  Normal affect   ASSESSMENT:    1. Coronary artery disease involving native coronary artery of native heart with unstable angina pectoris (HCC)   2. Coronary artery disease of native artery of native heart with stable angina pectoris (HCC)   3. Essential hypertension   4. Tobacco use   5. Alcohol use    PLAN:    In order of problems listed above:  CAD s/p PCI/DES in 08/2022 with angina Patient reports episodes of chest pain similar to prior stenting. Patient denies missing doses of DAPT. Toprol was increased at the last visit. He has headaches on higher doses of Imdur. He is  needing a refill of SL NTG. EKG with no changes. I will increase Imdur to 60mg  BID and refill SL NTG. We will bring him back in 1 month to evaluate symptoms. If he still has chest pain, would consider repeat cardiac cath.   Carotid artery disease Patient is s/p stenting on the right side. He missed most recent vascular appointment, he says he will undergo stenting on the left side.  HTN BP is elevated today. Increase Imdur as above. Continue Irbesartan and Toprol.   Tobacco use He is smoking 5 cigarettes daily.  Alcohol use Patient reports no further alcohol use.  Disposition: Follow up in 1 month(s) with MD/APP    Signed, Aashir Umholtz David Stall, PA-C  04/21/2023 1:49 PM    [facility]

## 2023-04-21 NOTE — Patient Instructions (Signed)
Medication Instructions:   Your physician has recommended you make the following change in your medication:   INCREASE Isosorbide 60 mg tablet by mouth twice daily.  *If you need a refill on your cardiac medications before your next appointment, please call your pharmacy*   Lab Work:  NONE  If you have labs (blood work) drawn today and your tests are completely normal, you will receive your results only by: MyChart Message (if you have MyChart) OR A paper copy in the mail If you have any lab test that is abnormal or we need to change your treatment, we will call you to review the results.   Testing/Procedures:  NONE   Follow-Up: At [facility], you and your health needs are our priority.  As part of our continuing mission to provide you with exceptional heart care, we have created designated Provider Care Teams.  These Care Teams include your primary Cardiologist (physician) and Advanced Practice Providers (APPs -  Physician Assistants and Nurse Practitioners) who all work together to provide you with the care you need, when you need it.  We recommend signing up for the patient portal called "MyChart".  Sign up information is provided on this After Visit Summary.  MyChart is used to connect with patients for Virtual Visits (Telemedicine).  Patients are able to view lab/test results, encounter notes, upcoming appointments, etc.  Non-urgent messages can be sent to your provider as well.   To learn more about what you can do with MyChart, go to [email or web address].    Your next appointment:   3-4 week(s)  Provider:   You may see Julien Nordmann, MD or one of the following Advanced Practice Providers on your designated Care Team:   Nicolasa Ducking, NP Eula Listen, PA-C Cadence Fransico Michael, PA-C Charlsie Quest, NP

## 2023-05-25 NOTE — Progress Notes (Deleted)
Cardiology Office Note:    Date:  05/25/2023   ID:  Marcus [name], DOB 06-18-1966, MRN [id number]  PCP:  Levonne Lapping, NP  [facility] Cardiologist:  Julien Nordmann, MD  [facility] Electrophysiologist:  None   Referring MD: Levonne Lapping, NP   Chief Complaint: ***  History of Present Illness:    Marcus [name] is a 57 y.o. male with a hx of CAD with stenting to the RI in 2015, HFrEF secondary to ischemic cardiomyopathy, Carotid artery disease s/p right carotid stenting 08/31/22, polysubstance use including EtOH, tobacco use, cocaine use, hypertension, and hyperlipidemia who presents for follow-up.   Patient was seen January 08/2022 reporting left-sided chest pain and was set up for heart cath.  He also reported dizziness and syncope carotid ultrasound was ordered. Carotid US showed 60-79% stenosis in the right internal carotid artery with 80-90% int he left internal carotid artery and left subclavian artery stenosis. STAT CTA head and neck were ordered and the patient was referred to [facility]. STATE CTA head showed severe focal stenosis 80% at the pRCA, and left ICA with 50% stenosis. The patient saw [facility] 08/18/22 and the patient was scheduled for carotid stenting and started on Plavix.    The patient underwent scheduled cardiac cath 08/26/22 showing severe single vessel CAD with 50-60^ ISR of RI stent and 90% de novo lesion just distal to the stent, mild to moderate non-critical disease, mildly elevated LVEDP . The patient was treated with successful PCI and intracoronary lithotripsy to ramus intermedius with reduction of ISR to 20-30% and reduction of de-novo lesion to 0%. The patient had post-cath chest pain and was given SL NTG x 2 with resolution of pain. EKG showed no changes. He also reported chest pain during the procedure. The patient was kept overnight for observation.  Patient was started on Imdur 30 mg daily at discharge.   Patient underwent right carotid stenting on  08/31/2022. Follow-up carotid duplex showed patent right carotid stent and stable 60-70% in the left ICA. Heart monitor for syncope showed predominantly normal sinus rhythm with an average heart rate of 76 bpm, 1 run of NSVT lasting 5 beats, 2 runs of SVT.  Patient triggered events associated with normal sinus rhythm.  The patient was last seen 04/21/23 reporting chest pain. Imdur was increased  Today ?repeat cath  Past Medical History:  Diagnosis Date   CAD in native artery    a. 06/28/2014: STEMI, cath LM nl, mLAD 50%, mLCx 20%, OM1 small in size 80%, ramus 100% s/p PCI/DES 0%, EF 40%    Carotid artery disease (HCC)    Cocaine abuse (HCC)    Depression    H/O gastric ulcer    Hyperglycemia    a. A1C 5.6%   Hypertension    Ischemic cardiomyopathy    a. EF 40% by cath 06/28/2014   MI (myocardial infarction) (HCC)    Polysubstance abuse (HCC)    a. cocaine, tobacco, and alcohol    Tobacco abuse     Past Surgical History:  Procedure Laterality Date   CARDIAC CATHETERIZATION  06/28/2014   x1 stent   CAROTID PTA/STENT INTERVENTION Right 08/31/2022   Procedure: CAROTID PTA/STENT INTERVENTION;  Surgeon: Annice Needy, MD;  Location: [facility];  Service: Cardiovascular;  Laterality: Right;   COLONOSCOPY N/A 08/12/2022   Procedure: COLONOSCOPY;  Surgeon: Toledo, Boykin Nearing, MD;  Location: [facility]OS[facility];  Service: Gastroenterology;  Laterality: N/A;   CORONARY STENT INTERVENTION N/A 08/26/2022  Procedure: CORONARY STENT INTERVENTION;  Surgeon: [name], MD;  Location: [facility];  Service: Cardiovascular;  Laterality: N/A;   ESOPHAGOGASTRODUODENOSCOPY N/A 08/12/2022   Procedure: ESOPHAGOGASTRODUODENOSCOPY (EGD);  Surgeon: Toledo, Boykin Nearing, MD;  Location: [facility]OS[facility];  Service: Gastroenterology;  Laterality: N/A;   LEFT HEART CATH AND CORONARY ANGIOGRAPHY Left 08/26/2022   Procedure: LEFT HEART CATH AND CORONARY ANGIOGRAPHY;  Surgeon: [name], MD;   Location: [facility];  Service: Cardiovascular;  Laterality: Left;   NISSEN FUNDOPLICATION     stomach ulcer surgery      Current Medications: No outpatient medications have been marked as taking for the 05/27/23 encounter (Appointment) with Fransico Michael, Anjana Cheek H, PA-C.     Allergies:   Oxycodone-acetaminophen and Codeine   Social History   Socioeconomic History   Marital status: Single    Spouse name: Not on file   Number of children: Not on file   Years of education: Not on file   Highest education level: Not on file  Occupational History   Not on file  Tobacco Use   Smoking status: Every Day    Current packs/day: 1.00    Average packs/day: 1 pack/day for 47.8 years (47.8 ttl pk-yrs)    Types: Cigarettes    Start date: 07/21/1975   Smokeless tobacco: Never   Tobacco comments:    Pt. Is actively trying to quit and is wearing the patch.   Vaping Use   Vaping status: Never Used  Substance and Sexual Activity   Alcohol use: Yes    Comment: every weekend 6-12 beer   Drug use: No    Types: Cocaine, Marijuana    Comment: none in at least 2 years   Sexual activity: Not on file  Other Topics Concern   Not on file  Social History Narrative   Not on file   Social Determinants of Health   Financial Resource Strain: Not on file  Food Insecurity: Not on file  Transportation Needs: Not on file  Physical Activity: Not on file  Stress: Not on file  Social Connections: Not on file     Family History: The patient's ***family history includes Heart attack in his brother; Heart failure in his father and mother; Prostate cancer in his brother; Stroke in his father.  ROS:   Please see the history of present illness.    *** All other systems reviewed and are negative.  EKGs/Labs/Other Studies Reviewed:    The following studies were reviewed today: ***  EKG:  EKG is *** ordered today.  The ekg ordered today demonstrates ***  Recent Labs: 08/12/2022: ALT 36 09/01/2022:  BUN 13; Creatinine, Ser 1.13; Potassium 4.6; Sodium 135 11/09/2022: Hemoglobin 14.3; Platelets 278  Recent Lipid Panel    Component Value Date/Time   CHOL 138 08/12/2022 1540   CHOL 140 06/29/2014 0353   TRIG 143 08/12/2022 1540   TRIG 176 06/29/2014 0353   HDL 61 08/12/2022 1540   HDL 45 06/29/2014 0353   CHOLHDL 2.3 08/12/2022 1540   VLDL 29 08/12/2022 1540   VLDL 35 06/29/2014 0353   LDLCALC 48 08/12/2022 1540   LDLCALC 60 06/29/2014 0353   LDLDIRECT 45 08/12/2022 1540     Risk Assessment/Calculations:   {Does this patient have ATRIAL FIBRILLATION?[phone]}   Physical Exam:    VS:  There were no vitals taken for this visit.    Wt Readings from Last 3 Encounters:  04/21/23 175 lb 9.6 oz (79.7 kg)  03/12/23 176  lb 2 oz (79.9 kg)  01/12/23 171 lb 3.2 oz (77.7 kg)     GEN: *** Well nourished, well developed in no acute distress HEENT: Normal NECK: No JVD; No carotid bruits LYMPHATICS: No lymphadenopathy CARDIAC: ***RRR, no murmurs, rubs, gallops RESPIRATORY:  Clear to auscultation without rales, wheezing or rhonchi  ABDOMEN: Soft, non-tender, non-distended MUSCULOSKELETAL:  No edema; No deformity  SKIN: Warm and dry NEUROLOGIC:  Alert and oriented x 3 PSYCHIATRIC:  Normal affect   ASSESSMENT:    No diagnosis found. PLAN:    In order of problems listed above:  ***  Disposition: Follow up {follow up:15908} with ***   Shared Decision Making/Informed Consent   {Are you ordering a CV Procedure (e.g. stress test, cath, DCCV, TEE, etc)?   Press F2        :[id number]}    Signed, Jervon Ream David Stall, PA-C  05/25/2023 3:36 PM    [facility]

## 2023-05-27 ENCOUNTER — Ambulatory Visit: Payer: Medicaid Other | Attending: Medical | Admitting: Medical

## 2023-06-01 ENCOUNTER — Other Ambulatory Visit (INDEPENDENT_AMBULATORY_CARE_PROVIDER_SITE_OTHER): Payer: Self-pay | Admitting: Vascular Surgery

## 2023-06-01 NOTE — Telephone Encounter (Signed)
Patient needs an appt as soon as possible

## 2023-07-09 ENCOUNTER — Ambulatory Visit: Payer: Medicaid Other | Attending: Medical | Admitting: Medical

## 2023-07-09 NOTE — Progress Notes (Deleted)
Cardiology Office Note:    Date:  07/09/2023   ID:  Marcus [name], DOB 07-Feb-1966, MRN [id number]  PCP:  Levonne Lapping, NP  [facility] Cardiologist:  Julien Nordmann, MD  [facility] Electrophysiologist:  None   Referring MD: Levonne Lapping, NP   Chief Complaint: ***  History of Present Illness:    Marcus [name] is a 57 y.o. male with a hx of CAD with stenting to the RI in 2015, HFrEF secondary to ischemic cardiomyopathy, Carotid artery disease s/p right carotid stenting 08/31/22, polysubstance use including EtOH, tobacco use, cocaine use, hypertension, and hyperlipidemia who presents for follow-up.   Patient was seen January 08/2022 reporting left-sided chest pain and was set up for heart cath.  He also reported dizziness and syncope carotid ultrasound was ordered.   Carotid US showed 60-79% stenosis in the right internal carotid artery with 80-90% int he left internal carotid artery and left subclavian artery stenosis. STAT CTA head and neck were ordered and the patient was referred to [facility]. STATE CTA head showed severe focal stenosis 80% at the pRCA, and left ICA with 50% stenosis. The patient saw [facility] 08/18/22 and the patient was scheduled for carotid stenting and started on Plavix.    The patient underwent scheduled cardiac cath 08/26/22 showing severe single vessel CAD with 50-60^ ISR of RI stent and 90% de novo lesion just distal to the stent, mild to moderate non-critical disease, mildly elevated LVEDP . The patient was treated with successful PCI and intracoronary lithotripsy to ramus intermedius with reduction of ISR to 20-30% and reduction of de-novo lesion to 0%. The patient had post-cath chest pain and was given SL NTG x 2 with resolution of pain. EKG showed no changes. He also reported chest pain during the procedure. The patient was kept overnight for observation.  Patient was started on Imdur 30 mg daily at discharge.   Patient underwent right carotid stenting  on 08/31/2022. Follow-up carotid duplex showed patent right carotid stent and stable 60-70% in the left ICA. Heart monitor for syncope showed predominantly normal sinus rhythm with an average heart rate of 76 bpm, 1 run of NSVT lasting 5 beats, 2 runs of SVT.  Patient triggered events associated with normal sinus rhythm.  The patient was last seen 04/21/23 reporting chest pain and Imdur was increased.   Today,     Past Medical History:  Diagnosis Date   CAD in native artery    a. 06/28/2014: STEMI, cath LM nl, mLAD 50%, mLCx 20%, OM1 small in size 80%, ramus 100% s/p PCI/DES 0%, EF 40%    Carotid artery disease (HCC)    Cocaine abuse (HCC)    Depression    H/O gastric ulcer    Hyperglycemia    a. A1C 5.6%   Hypertension    Ischemic cardiomyopathy    a. EF 40% by cath 06/28/2014   MI (myocardial infarction) (HCC)    Polysubstance abuse (HCC)    a. cocaine, tobacco, and alcohol    Tobacco abuse     Past Surgical History:  Procedure Laterality Date   CARDIAC CATHETERIZATION  06/28/2014   x1 stent   CAROTID PTA/STENT INTERVENTION Right 08/31/2022   Procedure: CAROTID PTA/STENT INTERVENTION;  Surgeon: Annice Needy, MD;  Location: [facility]ASIVE [facility];  Service: Cardiovascular;  Laterality: Right;   COLONOSCOPY N/A 08/12/2022   Procedure: COLONOSCOPY;  Surgeon: Toledo, Boykin Nearing, MD;  Location: [facility] ENDOSCOPY;  Service: Gastroenterology;  Laterality: N/A;   CORONARY STENT  INTERVENTION N/A 08/26/2022   Procedure: CORONARY STENT INTERVENTION;  Surgeon: [name], MD;  Location: [facility];  Service: Cardiovascular;  Laterality: N/A;   ESOPHAGOGASTRODUODENOSCOPY N/A 08/12/2022   Procedure: ESOPHAGOGASTRODUODENOSCOPY (EGD);  Surgeon: Toledo, Boykin Nearing, MD;  Location: [facility] ENDOSCOPY;  Service: Gastroenterology;  Laterality: N/A;   LEFT HEART CATH AND CORONARY ANGIOGRAPHY Left 08/26/2022   Procedure: LEFT HEART CATH AND CORONARY ANGIOGRAPHY;  Surgeon: [name], MD;   Location: [facility];  Service: Cardiovascular;  Laterality: Left;   NISSEN FUNDOPLICATION     stomach ulcer surgery      Current Medications: No outpatient medications have been marked as taking for the 07/09/23 encounter (Appointment) with Fransico Michael, Brylan Seubert H, PA-C.     Allergies:   Oxycodone-acetaminophen and Codeine   Social History   Socioeconomic History   Marital status: Single    Spouse name: Not on file   Number of children: Not on file   Years of education: Not on file   Highest education level: Not on file  Occupational History   Not on file  Tobacco Use   Smoking status: Every Day    Current packs/day: 1.00    Average packs/day: 1 pack/day for 48.0 years (48.0 ttl pk-yrs)    Types: Cigarettes    Start date: 07/21/1975   Smokeless tobacco: Never   Tobacco comments:    Pt. Is actively trying to quit and is wearing the patch.   Vaping Use   Vaping status: Never Used  Substance and Sexual Activity   Alcohol use: Yes    Comment: every weekend 6-12 beer   Drug use: No    Types: Cocaine, Marijuana    Comment: none in at least 2 years   Sexual activity: Not on file  Other Topics Concern   Not on file  Social History Narrative   Not on file   Social Drivers of Health   Financial Resource Strain: Not on file  Food Insecurity: Not on file  Transportation Needs: Not on file  Physical Activity: Not on file  Stress: Not on file  Social Connections: Not on file     Family History: The patient's ***family history includes Heart attack in his brother; Heart failure in his father and mother; Prostate cancer in his brother; Stroke in his father.  ROS:   Please see the history of present illness.    *** All other systems reviewed and are negative.  EKGs/Labs/Other Studies Reviewed:    The following studies were reviewed today: ***  EKG:  EKG is *** ordered today.  The ekg ordered today demonstrates ***  Recent Labs: 08/12/2022: ALT 36 09/01/2022: BUN  13; Creatinine, Ser 1.13; Potassium 4.6; Sodium 135 11/09/2022: Hemoglobin 14.3; Platelets 278  Recent Lipid Panel    Component Value Date/Time   CHOL 138 08/12/2022 1540   CHOL 140 06/29/2014 0353   TRIG 143 08/12/2022 1540   TRIG 176 06/29/2014 0353   HDL 61 08/12/2022 1540   HDL 45 06/29/2014 0353   CHOLHDL 2.3 08/12/2022 1540   VLDL 29 08/12/2022 1540   VLDL 35 06/29/2014 0353   LDLCALC 48 08/12/2022 1540   LDLCALC 60 06/29/2014 0353   LDLDIRECT 45 08/12/2022 1540     Risk Assessment/Calculations:   {Does this patient have ATRIAL FIBRILLATION?[phone]}   Physical Exam:    VS:  There were no vitals taken for this visit.    Wt Readings from Last 3 Encounters:  04/21/23 175 lb 9.6 oz (  79.7 kg)  03/12/23 176 lb 2 oz (79.9 kg)  01/12/23 171 lb 3.2 oz (77.7 kg)     GEN: *** Well nourished, well developed in no acute distress HEENT: Normal NECK: No JVD; No carotid bruits LYMPHATICS: No lymphadenopathy CARDIAC: ***RRR, no murmurs, rubs, gallops RESPIRATORY:  Clear to auscultation without rales, wheezing or rhonchi  ABDOMEN: Soft, non-tender, non-distended MUSCULOSKELETAL:  No edema; No deformity  SKIN: Warm and dry NEUROLOGIC:  Alert and oriented x 3 PSYCHIATRIC:  Normal affect   ASSESSMENT:    No diagnosis found. PLAN:    In order of problems listed above:  ***  Disposition: Follow up {follow up:15908} with ***   Shared Decision Making/Informed Consent   {Are you ordering a CV Procedure (e.g. stress test, cath, DCCV, TEE, etc)?   Press F2        :[id number]}    Signed, Shakti Fleer Ardelle Lesches  07/09/2023 7:16 AM    [facility]

## 2023-08-11 ENCOUNTER — Other Ambulatory Visit (INDEPENDENT_AMBULATORY_CARE_PROVIDER_SITE_OTHER): Payer: Self-pay | Admitting: Vascular Surgery

## 2023-08-11 ENCOUNTER — Encounter (INDEPENDENT_AMBULATORY_CARE_PROVIDER_SITE_OTHER): Payer: Self-pay

## 2023-08-12 ENCOUNTER — Telehealth (INDEPENDENT_AMBULATORY_CARE_PROVIDER_SITE_OTHER): Payer: Self-pay | Admitting: Nurse Practitioner

## 2023-08-12 NOTE — Telephone Encounter (Signed)
Attempted to call patient to schedule fu + Carotid with FB per nurse. Tried call phone number on file x3. Unable to reach or leave message. Message: Your call cannot be completed at this time.

## 2023-08-19 ENCOUNTER — Encounter: Payer: Self-pay | Admitting: Intensive Care

## 2023-08-19 ENCOUNTER — Emergency Department: Payer: Medicare Other

## 2023-08-19 ENCOUNTER — Other Ambulatory Visit: Payer: Self-pay

## 2023-08-19 ENCOUNTER — Inpatient Hospital Stay
Admission: EM | Admit: 2023-08-19 | Discharge: 2023-08-21 | DRG: 309 | Disposition: A | Discharge status: Home / Self Care | Payer: Medicare Other | Attending: Internal Medicine | Admitting: Internal Medicine

## 2023-08-19 DIAGNOSIS — Z7951 Long term (current) use of inhaled steroids: Secondary | ICD-10-CM

## 2023-08-19 DIAGNOSIS — I6529 Occlusion and stenosis of unspecified carotid artery: Secondary | ICD-10-CM | POA: Diagnosis present

## 2023-08-19 DIAGNOSIS — Z79899 Other long term (current) drug therapy: Secondary | ICD-10-CM | POA: Diagnosis not present

## 2023-08-19 DIAGNOSIS — F329 Major depressive disorder, single episode, unspecified: Secondary | ICD-10-CM | POA: Diagnosis present

## 2023-08-19 DIAGNOSIS — J449 Chronic obstructive pulmonary disease, unspecified: Secondary | ICD-10-CM | POA: Diagnosis present

## 2023-08-19 DIAGNOSIS — F17210 Nicotine dependence, cigarettes, uncomplicated: Secondary | ICD-10-CM | POA: Diagnosis present

## 2023-08-19 DIAGNOSIS — Z955 Presence of coronary angioplasty implant and graft: Secondary | ICD-10-CM

## 2023-08-19 DIAGNOSIS — I4891 Unspecified atrial fibrillation: Principal | ICD-10-CM

## 2023-08-19 DIAGNOSIS — Z885 Allergy status to narcotic agent status: Secondary | ICD-10-CM

## 2023-08-19 DIAGNOSIS — Y906 Blood alcohol level of 120-199 mg/100 ml: Secondary | ICD-10-CM | POA: Diagnosis present

## 2023-08-19 DIAGNOSIS — I4510 Unspecified right bundle-branch block: Secondary | ICD-10-CM | POA: Diagnosis present

## 2023-08-19 DIAGNOSIS — I160 Hypertensive urgency: Secondary | ICD-10-CM | POA: Diagnosis present

## 2023-08-19 DIAGNOSIS — F1910 Other psychoactive substance abuse, uncomplicated: Secondary | ICD-10-CM | POA: Diagnosis not present

## 2023-08-19 DIAGNOSIS — Z7982 Long term (current) use of aspirin: Secondary | ICD-10-CM

## 2023-08-19 DIAGNOSIS — I10 Essential (primary) hypertension: Secondary | ICD-10-CM | POA: Diagnosis not present

## 2023-08-19 DIAGNOSIS — I2489 Other forms of acute ischemic heart disease: Principal | ICD-10-CM

## 2023-08-19 DIAGNOSIS — Z7902 Long term (current) use of antithrombotics/antiplatelets: Secondary | ICD-10-CM | POA: Diagnosis not present

## 2023-08-19 DIAGNOSIS — I6521 Occlusion and stenosis of right carotid artery: Secondary | ICD-10-CM | POA: Diagnosis present

## 2023-08-19 DIAGNOSIS — I708 Atherosclerosis of other arteries: Secondary | ICD-10-CM | POA: Diagnosis present

## 2023-08-19 DIAGNOSIS — Z1152 Encounter for screening for COVID-19: Secondary | ICD-10-CM

## 2023-08-19 DIAGNOSIS — I252 Old myocardial infarction: Secondary | ICD-10-CM

## 2023-08-19 DIAGNOSIS — I2510 Atherosclerotic heart disease of native coronary artery without angina pectoris: Secondary | ICD-10-CM | POA: Diagnosis present

## 2023-08-19 DIAGNOSIS — E785 Hyperlipidemia, unspecified: Secondary | ICD-10-CM | POA: Diagnosis present

## 2023-08-19 DIAGNOSIS — I110 Hypertensive heart disease with heart failure: Secondary | ICD-10-CM | POA: Diagnosis present

## 2023-08-19 DIAGNOSIS — F1410 Cocaine abuse, uncomplicated: Secondary | ICD-10-CM | POA: Diagnosis present

## 2023-08-19 DIAGNOSIS — I471 Supraventricular tachycardia, unspecified: Secondary | ICD-10-CM | POA: Diagnosis present

## 2023-08-19 DIAGNOSIS — J4489 Other specified chronic obstructive pulmonary disease: Secondary | ICD-10-CM

## 2023-08-19 DIAGNOSIS — I255 Ischemic cardiomyopathy: Secondary | ICD-10-CM | POA: Diagnosis present

## 2023-08-19 DIAGNOSIS — Z8249 Family history of ischemic heart disease and other diseases of the circulatory system: Secondary | ICD-10-CM | POA: Diagnosis not present

## 2023-08-19 DIAGNOSIS — R079 Chest pain, unspecified: Secondary | ICD-10-CM | POA: Diagnosis present

## 2023-08-19 DIAGNOSIS — I5020 Unspecified systolic (congestive) heart failure: Secondary | ICD-10-CM | POA: Insufficient documentation

## 2023-08-19 DIAGNOSIS — Z91148 Patient's other noncompliance with medication regimen for other reason: Secondary | ICD-10-CM

## 2023-08-19 DIAGNOSIS — Z789 Other specified health status: Secondary | ICD-10-CM

## 2023-08-19 DIAGNOSIS — F1010 Alcohol abuse, uncomplicated: Secondary | ICD-10-CM | POA: Diagnosis present

## 2023-08-19 DIAGNOSIS — I5022 Chronic systolic (congestive) heart failure: Secondary | ICD-10-CM | POA: Diagnosis present

## 2023-08-19 DIAGNOSIS — Z8711 Personal history of peptic ulcer disease: Secondary | ICD-10-CM

## 2023-08-19 HISTORY — DX: Heart failure, unspecified: I50.9

## 2023-08-19 LAB — TSH: TSH: 1.216 u[IU]/mL (ref 0.350–4.500)

## 2023-08-19 LAB — BASIC METABOLIC PANEL
Anion gap: 19 — ABNORMAL HIGH (ref 5–15)
BUN: 14 mg/dL (ref 6–20)
CO2: 23 mmol/L (ref 22–32)
Calcium: 9.3 mg/dL (ref 8.9–10.3)
Chloride: 98 mmol/L (ref 98–111)
Creatinine, Ser: 0.87 mg/dL (ref 0.61–1.24)
GFR, Estimated: >60 mL/min (ref >60)
Glucose, Bld: 107 mg/dL — ABNORMAL HIGH (ref 70–99)
Potassium: 3.8 mmol/L (ref 3.5–5.1)
Sodium: 140 mmol/L (ref 135–145)

## 2023-08-19 LAB — ETHANOL: Alcohol, Ethyl (B): 151 mg/dL — ABNORMAL HIGH (ref <10)

## 2023-08-19 LAB — TROPONIN I (HIGH SENSITIVITY)
Troponin I (High Sensitivity): 62 ng/L — ABNORMAL HIGH (ref <18)
Troponin I (High Sensitivity): 70 ng/L — ABNORMAL HIGH (ref <18)

## 2023-08-19 LAB — CBC
HCT: 45.9 % (ref 39.0–52.0)
Hemoglobin: 16 g/dL (ref 13.0–17.0)
MCH: 32.1 pg (ref 26.0–34.0)
MCHC: 34.9 g/dL (ref 30.0–36.0)
MCV: 92.2 fL (ref 80.0–100.0)
Platelets: 285 10*3/uL (ref 150–400)
RBC: 4.98 MIL/uL (ref 4.22–5.81)
RDW: 13.7 % (ref 11.5–15.5)
WBC: 9 10*3/uL (ref 4.0–10.5)
nRBC: 0 % (ref 0.0–0.2)

## 2023-08-19 LAB — PROTIME-INR
INR: 0.9 (ref 0.8–1.2)
Prothrombin Time: 12.3 s (ref 11.4–15.2)

## 2023-08-19 LAB — D-DIMER, QUANTITATIVE: D-Dimer, Quant: 1.07 ug{FEU}/mL — ABNORMAL HIGH (ref 0.00–0.50)

## 2023-08-19 LAB — URINE DRUG SCREEN, QUALITATIVE (ARMC ONLY)
Amphetamines, Ur Screen: NOT DETECTED
Barbiturates, Ur Screen: NOT DETECTED
Benzodiazepine, Ur Scrn: NOT DETECTED
Cannabinoid 50 Ng, Ur ~~LOC~~: NOT DETECTED
Cocaine Metabolite,Ur ~~LOC~~: NOT DETECTED
MDMA (Ecstasy)Ur Screen: NOT DETECTED
Methadone Scn, Ur: NOT DETECTED
Opiate, Ur Screen: NOT DETECTED
Phencyclidine (PCP) Ur S: NOT DETECTED
Tricyclic, Ur Screen: NOT DETECTED

## 2023-08-19 LAB — APTT: aPTT: 149 s — ABNORMAL HIGH (ref 24–36)

## 2023-08-19 LAB — BRAIN NATRIURETIC PEPTIDE: B Natriuretic Peptide: 47.6 pg/mL (ref 0.0–100.0)

## 2023-08-19 LAB — T4, FREE: Free T4: 0.93 ng/dL (ref 0.61–1.12)

## 2023-08-19 MED ORDER — IOHEXOL 350 MG/ML SOLN
100.0000 mL | Freq: Once | INTRAVENOUS | Status: AC | PRN
  Administered 2023-08-19: 100 mL via INTRAVENOUS

## 2023-08-19 MED ORDER — MORPHINE SULFATE (PF) 2 MG/ML IV SOLN
2.0000 mg | INTRAVENOUS | Status: DC | PRN
  Administered 2023-08-20: 2 mg via INTRAVENOUS
  Filled 2023-08-19: qty 1

## 2023-08-19 MED ORDER — METOPROLOL SUCCINATE ER 50 MG PO TB24: 75.0000 mg | ORAL_TABLET | Freq: Every day | ORAL | Status: DC

## 2023-08-19 MED ORDER — SODIUM CHLORIDE 0.9 % IV BOLUS
1000.0000 mL | Freq: Once | INTRAVENOUS | Status: AC
  Administered 2023-08-19: 1000 mL via INTRAVENOUS

## 2023-08-19 MED ORDER — METOPROLOL TARTRATE 5 MG/5ML IV SOLN
10.0000 mg | Freq: Once | INTRAVENOUS | Status: AC
  Administered 2023-08-19: 10 mg via INTRAVENOUS
  Filled 2023-08-19: qty 10

## 2023-08-19 MED ORDER — ISOSORBIDE MONONITRATE ER 60 MG PO TB24
60.0000 mg | ORAL_TABLET | Freq: Every day | ORAL | Status: DC
Start: 2023-08-20 — End: 2023-08-20

## 2023-08-19 MED ORDER — LORAZEPAM 1 MG PO TABS: 1.0000 mg | ORAL_TABLET | ORAL | Status: DC | PRN

## 2023-08-19 MED ORDER — NITROGLYCERIN 0.4 MG SL SUBL
0.4000 mg | SUBLINGUAL_TABLET | Freq: Once | SUBLINGUAL | Status: AC
  Administered 2023-08-19: 0.4 mg via SUBLINGUAL
  Filled 2023-08-19: qty 1

## 2023-08-19 MED ORDER — DILTIAZEM HCL 25 MG/5ML IV SOLN: 25.0000 mg | Freq: Once | INTRAVENOUS | Status: AC

## 2023-08-19 MED ORDER — FENTANYL CITRATE PF 50 MCG/ML IJ SOSY
PREFILLED_SYRINGE | INTRAMUSCULAR | Status: AC
  Filled 2023-08-19: qty 1

## 2023-08-19 MED ORDER — LORAZEPAM 2 MG/ML IJ SOLN
1.0000 mg | INTRAMUSCULAR | Status: DC | PRN
  Administered 2023-08-19 – 2023-08-20 (×3): 2 mg via INTRAVENOUS
  Filled 2023-08-19 (×3): qty 1

## 2023-08-19 MED ORDER — ASPIRIN 325 MG PO TABS
325.0000 mg | ORAL_TABLET | Freq: Once | ORAL | Status: AC
  Administered 2023-08-19: 325 mg via ORAL
  Filled 2023-08-19: qty 1

## 2023-08-19 MED ORDER — AMIODARONE IV BOLUS ONLY 150 MG/100ML
150.0000 mg | Freq: Once | INTRAVENOUS | Status: AC
  Administered 2023-08-19: 150 mg via INTRAVENOUS

## 2023-08-19 MED ORDER — MORPHINE SULFATE (PF) 2 MG/ML IV SOLN
2.0000 mg | Freq: Once | INTRAVENOUS | Status: AC
  Administered 2023-08-19: 2 mg via INTRAVENOUS
  Filled 2023-08-19: qty 1

## 2023-08-19 MED ORDER — QUETIAPINE FUMARATE 200 MG PO TABS
200.0000 mg | ORAL_TABLET | Freq: Every day | ORAL | Status: DC
Start: 2023-08-20 — End: 2023-08-21
  Administered 2023-08-20: 200 mg via ORAL
  Filled 2023-08-19: qty 1

## 2023-08-19 MED ORDER — NITROGLYCERIN 0.4 MG SL SUBL: 0.4000 mg | SUBLINGUAL_TABLET | SUBLINGUAL | Status: DC | PRN

## 2023-08-19 MED ORDER — ETOMIDATE 2 MG/ML IV SOLN: 10.0000 mg | Freq: Once | INTRAVENOUS | Status: AC

## 2023-08-19 MED ORDER — HEPARIN (PORCINE) 25000 UT/250ML-% IV SOLN
1050.0000 [IU]/h | INTRAVENOUS | Status: DC
  Administered 2023-08-19 – 2023-08-20 (×2): 900 [IU]/h via INTRAVENOUS
  Filled 2023-08-19 (×2): qty 250

## 2023-08-19 MED ORDER — DULOXETINE HCL 30 MG PO CPEP
30.0000 mg | ORAL_CAPSULE | Freq: Every day | ORAL | Status: DC
  Administered 2023-08-20 – 2023-08-21 (×2): 30 mg via ORAL
  Filled 2023-08-19 (×2): qty 1

## 2023-08-19 MED ORDER — DILTIAZEM HCL 25 MG/5ML IV SOLN
INTRAVENOUS | Status: AC
  Filled 2023-08-19: qty 5

## 2023-08-19 MED ORDER — AMIODARONE IV BOLUS ONLY 150 MG/100ML
INTRAVENOUS | Status: AC
  Filled 2023-08-19: qty 100

## 2023-08-19 MED ORDER — HEPARIN BOLUS VIA INFUSION
4000.0000 [IU] | Freq: Once | INTRAVENOUS | Status: AC
  Administered 2023-08-19: 4000 [IU] via INTRAVENOUS
  Filled 2023-08-19: qty 4000

## 2023-08-19 MED ORDER — THIAMINE HCL 100 MG/ML IJ SOLN
100.0000 mg | Freq: Every day | INTRAMUSCULAR | Status: DC
  Filled 2023-08-19: qty 2

## 2023-08-19 MED ORDER — DILTIAZEM HCL 25 MG/5ML IV SOLN
20.0000 mg | Freq: Once | INTRAVENOUS | Status: AC
  Administered 2023-08-19: 20 mg via INTRAVENOUS

## 2023-08-19 MED ORDER — DILTIAZEM HCL 25 MG/5ML IV SOLN
INTRAVENOUS | Status: AC
  Administered 2023-08-19: 25 mg via INTRAVENOUS
  Filled 2023-08-19: qty 5

## 2023-08-19 MED ORDER — FENTANYL CITRATE PF 50 MCG/ML IJ SOSY
50.0000 ug | PREFILLED_SYRINGE | Freq: Once | INTRAMUSCULAR | Status: AC
  Administered 2023-08-19: 50 ug via INTRAVENOUS
  Filled 2023-08-19: qty 1

## 2023-08-19 MED ORDER — ADULT MULTIVITAMIN W/MINERALS CH
1.0000 | ORAL_TABLET | Freq: Every day | ORAL | Status: DC
  Administered 2023-08-19 – 2023-08-21 (×3): 1 via ORAL
  Filled 2023-08-19 (×3): qty 1

## 2023-08-19 MED ORDER — ETOMIDATE 2 MG/ML IV SOLN: 5.0000 mg | Freq: Once | INTRAVENOUS | Status: DC

## 2023-08-19 MED ORDER — ROSUVASTATIN CALCIUM 20 MG PO TABS
40.0000 mg | ORAL_TABLET | Freq: Every day | ORAL | Status: DC
  Administered 2023-08-20 – 2023-08-21 (×2): 40 mg via ORAL
  Filled 2023-08-19 (×3): qty 2

## 2023-08-19 MED ORDER — ETOMIDATE 2 MG/ML IV SOLN
INTRAVENOUS | Status: AC
  Administered 2023-08-19: 10 mg via INTRAVENOUS
  Filled 2023-08-19: qty 10

## 2023-08-19 MED ORDER — THIAMINE MONONITRATE 100 MG PO TABS
100.0000 mg | ORAL_TABLET | Freq: Every day | ORAL | Status: DC
  Administered 2023-08-19 – 2023-08-21 (×3): 100 mg via ORAL
  Filled 2023-08-19 (×3): qty 1

## 2023-08-19 MED ORDER — MOMETASONE FURO-FORMOTEROL FUM 200-5 MCG/ACT IN AERO
2.0000 | INHALATION_SPRAY | Freq: Two times a day (BID) | RESPIRATORY_TRACT | Status: DC
  Administered 2023-08-20 – 2023-08-21 (×3): 2 via RESPIRATORY_TRACT
  Filled 2023-08-19: qty 8.8

## 2023-08-19 MED ORDER — FOLIC ACID 1 MG PO TABS
1.0000 mg | ORAL_TABLET | Freq: Every day | ORAL | Status: DC
  Administered 2023-08-19 – 2023-08-21 (×3): 1 mg via ORAL
  Filled 2023-08-19 (×3): qty 1

## 2023-08-19 NOTE — ED Triage Notes (Signed)
Patient c/o chest pain that started around 2:45pm. Reports numbness to bilateral arms. History stents placed 6 months ago

## 2023-08-19 NOTE — ED Provider Triage Note (Signed)
Emergency Medicine Provider Triage Evaluation Note  Marcus [name] , a 58 y.o. male  was evaluated in triage.  Pt complains of chest pain and palpitations since 2:45pm. Heart rate 160s.   Physical Exam  BP 109/82 (BP Location: Left Arm)   Pulse (!) 161   Temp 97.7 F (36.5 C) (Oral)   Resp (!) 24   Ht 5\' 3"  (1.6 m)   Wt 81.6 kg   SpO2 92%   BMI 31.89 kg/m  Gen:   Awake, no distress   Resp:  Normal effort  MSK:   Moves extremities without difficulty  Other:    Medical Decision Making  Medically screening exam initiated at 3:21 PM.  Appropriate orders placed.  Marcus [name] was informed that the remainder of the evaluation will be completed by another provider, this initial triage assessment does not replace that evaluation, and the importance of remaining in the ED until their evaluation is complete.  Direct to ED bed 10.   Chinita Pester, FNP 08/19/23 2102

## 2023-08-19 NOTE — Consult Note (Signed)
PHARMACY - ANTICOAGULATION CONSULT NOTE  Pharmacy Consult for Heparin Indication: chest pain/ACS  Allergies  Allergen Reactions   Oxycodone-Acetaminophen Other (See Comments)   Codeine Rash   Patient Measurements: Height: 5\' 3"  (160 cm) Weight: 80.2 kg (176 lb 14.4 oz) IBW/kg (Calculated) : 56.9 Heparin Dosing Weight: 74.3 kg   Vital Signs: Temp: 97.7 F (36.5 C) (01/30 1515) Temp Source: Oral (01/30 1515) BP: 184/82 (01/30 1705) Pulse Rate: 98 (01/30 1705)  Labs: Recent Labs    08/19/23 1518  HGB 16.0  HCT 45.9  PLT 285  LABPROT 12.3  INR 0.9  CREATININE 0.87  TROPONINIHS 62*   Estimated Creatinine Clearance: 87.7 mL/min (by C-G formula based on SCr of 0.87 mg/dL).  Medical History: Past Medical History:  Diagnosis Date   CAD in native artery    a. 06/28/2014: STEMI, cath LM nl, mLAD 50%, mLCx 20%, OM1 small in size 80%, ramus 100% s/p PCI/DES 0%, EF 40%    Carotid artery disease (HCC)    CHF (congestive heart failure) (HCC)    Cocaine abuse (HCC)    Depression    H/O gastric ulcer    Hyperglycemia    a. A1C 5.6%   Hypertension    Ischemic cardiomyopathy    a. EF 40% by cath 06/28/2014   MI (myocardial infarction) (HCC)    Polysubstance abuse (HCC)    a. cocaine, tobacco, and alcohol    Tobacco abuse    Medications:  No PTA anticoagulation noted from chart review   Assessment: Marcus [name] is a 58 year old male that presented with chest pain and numbness to both arms. Elevated troponin of 62. From chart review, patient is not on any PTA anticoagulation. Pharmacy has been consulted for initation and management of heparin infusion. Baseline labs: Hgb 16.0, PLT 285, INR 0.9, aPTT has been ordered.   Goal of Therapy:  Heparin level 0.3-0.7 units/ml Monitor platelets by anticoagulation protocol: Yes   Plan:  Give 4000 unit bolus x 1  Start heparin infusion at 900 units/hr  Check HL 6 hours after initiation of infusion  Monitor CBC daily and for  signs/symptoms of bleeding   Littie Deeds, PharmD Pharmacy Resident  08/19/2023 5:49 PM

## 2023-08-19 NOTE — ED Notes (Signed)
Mohammed Kindle, MD at bedside assessing patient.

## 2023-08-19 NOTE — ED Provider Notes (Signed)
[facility] Provider Note    Event Date/Time   First MD Initiated Contact with Patient 08/19/23 1525     (approximate)   History   Chest Pain   HPI  Marcus [name] is a 58 y.o. male with history of CAD status post tenting, history of substance abuse, cocaine use, alcohol abuse presenting with chest pain started 1 hour prior to presentation.  States that chest pain noted on and off for about a week.  It states that this episode came on and radiates to his back.  He denies prior history of arrhythmias.  States that he is compliant with medication but did not take his aspirin and Plavix today.  He denies any drug use including cocaine use.  States that he drank 3 beers in the morning 3 hours prior to presentation.  He denies fever, cough, abdominal pain, nausea, vomiting, diarrhea, urinary symptoms, new leg swelling.  On independent chart review he was seen by his cardiologist in October, had stenting in [place] in 2015, has history of heart failure with reduced ejection fraction due to ischemic cardiomyopathy, had a heart monitor for syncope that showed 1 run of nonsustained V. tach as well as 2 runs of SVT.     Physical Exam   Triage Vital Signs: ED Triage Vitals  Encounter Vitals Group     BP 08/19/23 1517 109/82     Systolic BP Percentile --      Diastolic BP Percentile --      Pulse Rate 08/19/23 1515 (!) 161     Resp 08/19/23 1515 (!) 24     Temp 08/19/23 1515 97.7 F (36.5 C)     Temp Source 08/19/23 1515 Oral     SpO2 08/19/23 1515 92 %     Weight 08/19/23 1515 180 lb (81.6 kg)     Height 08/19/23 1515 5\' 3"  (1.6 m)     Head Circumference --      Peak Flow --      Pain Score 08/19/23 1515 10     Pain Loc --      Pain Education --      Exclude from Growth Chart --     Most recent vital signs: Vitals:   08/19/23 1937 08/19/23 2151  BP:  (!) 163/88  Pulse:  88  Resp:    Temp: 97.9 F (36.6 C)   SpO2:       General: Awake,  writhing around bed CV:  Good peripheral perfusion.  Equal radial pulses bilaterally Resp:  Normal effort.  Clear to auscultation Abd:  No distention.  Soft nontender Other:  No unilateral calf swelling or tenderness   ED Results / Procedures / Treatments   Labs (all labs ordered are listed, but only abnormal results are displayed) Labs Reviewed  BASIC METABOLIC PANEL - Abnormal; Notable for the following components:      Result Value   Glucose, Bld 107 (*)    Anion gap 19 (*)    All other components within normal limits  ETHANOL - Abnormal; Notable for the following components:   Alcohol, Ethyl (B) 151 (*)    All other components within normal limits  APTT - Abnormal; Notable for the following components:   aPTT 149 (*)    All other components within normal limits  D-DIMER, QUANTITATIVE - Abnormal; Notable for the following components:   D-Dimer, Quant 1.07 (*)    All other components within normal limits  TROPONIN  I (HIGH SENSITIVITY) - Abnormal; Notable for the following components:   Troponin I (High Sensitivity) 62 (*)    All other components within normal limits  TROPONIN I (HIGH SENSITIVITY) - Abnormal; Notable for the following components:   Troponin I (High Sensitivity) 70 (*)    All other components within normal limits  CBC  PROTIME-INR  BRAIN NATRIURETIC PEPTIDE  URINE DRUG SCREEN, QUALITATIVE ([facility] ONLY)  TSH  T4, FREE  HEPARIN LEVEL (UNFRACTIONATED)  CBC     EKG  Tachycardia to 160s, right bundle branch block, possible SVT with an old bundle with lateral ST depressions, no ischemic ST elevations.  This is change compared to prior  Repeat EKG showed atrial fibrillation, rate of 157, widened QRS, right bundle branch block is old, no ischemic ST elevation, decision compared to prior   RADIOLOGY CT angio on my interpretation without obvious dissection   PROCEDURES:  Critical Care performed: Yes, see critical care procedure note(s)  .Critical  Care  Performed by: Claybon Jabs, MD Authorized by: Claybon Jabs, MD   Critical care provider statement:    Critical care time (minutes):  50   Critical care was necessary to treat or prevent imminent or life-threatening deterioration of the following conditions:  Cardiac failure   Critical care was time spent personally by me on the following activities:  Development of treatment plan with patient or surrogate, discussions with consultants, evaluation of patient's response to treatment, examination of patient, ordering and review of laboratory studies, ordering and review of radiographic studies, ordering and performing treatments and interventions, pulse oximetry, re-evaluation of patient's condition and review of old charts    MEDICATIONS ORDERED IN ED: Medications  amiodarone (NEXTERONE) 1.5 mg/mL IV bolus only (  Not Given 08/19/23 1620)  fentaNYL (SUBLIMAZE) 50 MCG/ML injection (50 mcg Intravenous Not Given 08/19/23 1622)  diltiazem (CARDIZEM) 25 MG/5ML injection (  Not Given 08/19/23 1621)  isosorbide mononitrate (IMDUR) 24 hr tablet 60 mg (has no administration in time range)  DULoxetine (CYMBALTA) DR capsule 30 mg (has no administration in time range)  QUEtiapine (SEROQUEL) tablet 200 mg (has no administration in time range)  mometasone-formoterol (DULERA) 200-5 MCG/ACT inhaler 2 puff (has no administration in time range)  rosuvastatin (CRESTOR) tablet 40 mg (has no administration in time range)  metoprolol succinate (TOPROL-XL) 24 hr tablet 75 mg (has no administration in time range)  LORazepam (ATIVAN) tablet 1-4 mg ( Oral See Alternative 08/19/23 2157)    Or  LORazepam (ATIVAN) injection 1-4 mg (2 mg Intravenous Given 08/19/23 2157)  thiamine (VITAMIN B1) tablet 100 mg (100 mg Oral Given 08/19/23 1839)    Or  thiamine (VITAMIN B1) injection 100 mg ( Intravenous See Alternative 08/19/23 1839)  folic acid (FOLVITE) tablet 1 mg (1 mg Oral Given 08/19/23 1839)  multivitamin with minerals  tablet 1 tablet (1 tablet Oral Given 08/19/23 1839)  heparin ADULT infusion 100 units/mL (25000 units/253mL) (900 Units/hr Intravenous New Bag/Given 08/19/23 1843)  morphine (PF) 2 MG/ML injection 2 mg (has no administration in time range)  nitroGLYCERIN (NITROSTAT) SL tablet 0.4 mg (has no administration in time range)  amiodarone (NEXTERONE) 1.5 mg/mL IV bolus only 150 mg (0 mg Intravenous Stopped 08/19/23 1545)  sodium chloride 0.9 % bolus 1,000 mL (0 mLs Intravenous Stopped 08/19/23 1708)  etomidate (AMIDATE) injection 10 mg (10 mg Intravenous Given 08/19/23 1546)  iohexol (OMNIPAQUE) 350 MG/ML injection 100 mL (100 mLs Intravenous Contrast Given 08/19/23 1603)  fentaNYL (SUBLIMAZE) injection 50 mcg (  50 mcg Intravenous Given 08/19/23 1630)  diltiazem (CARDIZEM) injection 20 mg (20 mg Intravenous Given 08/19/23 1555)  diltiazem (CARDIZEM) injection 25 mg (25 mg Intravenous Given 08/19/23 1559)  aspirin tablet 325 mg (325 mg Oral Given 08/19/23 1708)  morphine (PF) 2 MG/ML injection 2 mg (2 mg Intravenous Given 08/19/23 1755)  nitroGLYCERIN (NITROSTAT) SL tablet 0.4 mg (0.4 mg Sublingual Given 08/19/23 1754)  metoprolol tartrate (LOPRESSOR) injection 10 mg (10 mg Intravenous Given 08/19/23 1846)  heparin bolus via infusion 4,000 Units (4,000 Units Intravenous Bolus from Bag 08/19/23 1844)     IMPRESSION / MDM / ASSESSMENT AND PLAN / ED COURSE  I reviewed the triage vital signs and the nursing notes.                              Differential diagnosis includes, but is not limited to, arrhythmia, dissection, ACS, electrolyte derangements, substance abuse.  Patient's presentation is most consistent with acute presentation with potential threat to life or bodily function.  When patient initially presented, his heart rates in the 160s to 170s, gave him an amiodarone bolus but heart rates would only come down to 150s and there is no change in the rhythm.  Patient started to complain of chest pain that goes  into his back, given that what appears to be possible SVT with old right bundle now with a chest pain, I considered him more unstable given the chest pain, was planning to synchronize cardiovert him, discussed this with the patient and he is agreeable plan including the risk and benefits.  He was given 10 mg of etomidate for sedation, but while we are waiting for him to get further sedated, his rhythm changed to atrial fibrillation with RVR.  I had initially ordered 5 more of etomidate as well as 50 mcg of fentanyl to be given to him before this rhythm change.  These medications were not given to him.  Once rhythm change occurred, patient started to calm down, states the chest pain is improving, gave him 20 mg of Dilt with improvement to the heart rate to the 110s to 130s.  Gave him another 25mg  of Dilt with improvement to the 90s.  Patient states no prior history of atrial fibrillation in the past.  Accompanied patient to CT, did not see an obvious dissection on CT.  Independent review of labs, his urine drug screen is negative, his alcohol level is 151, Trope is elevated, will give him an aspirin.  Electrolytes not severely deranged, no leukocytosis.  Given this new atrial fibrillation as well as his overall risk factors given his history of ischemic cardiomyopathy and CAD patient is at high risk and needs to be admitted.  Consult hospitalist who was agreeable plan for admission will evaluate the patient.     FINAL CLINICAL IMPRESSION(S) / ED DIAGNOSES   Final diagnoses:  Atrial fibrillation with rapid ventricular response (HCC)  Chest pain, unspecified type  Alcohol use     Rx / DC Orders   ED Discharge Orders     None        Note:  This document was prepared using Dragon voice recognition software and may include unintentional dictation errors.    Claybon Jabs, MD 08/19/23 [phone]

## 2023-08-19 NOTE — ED Notes (Signed)
Attempted to call report to ICU- ICU stating they will not be taking this patient at this time. States they are waiting till they have another nurse and will let us know when they will take patient.

## 2023-08-19 NOTE — H&P (Addendum)
History and Physical    Marcus [name]:[id number] DOB: 1966-04-14 DOA: 08/19/2023  PCP: Levonne Lapping, NP  Patient coming from: home   Chief Complaint: chest pain  HPI: Marcus [name] is a 58 y.o. male with medical history significant for cad s/p stent x2, polysubstance abuse (etoh, cocaine), htn, hfpef, carotis stenosis s/p stent, presenting with the above.  Denies recent cocaine but has been drinking today. Developed while seated severe substernal chest pain radiating to his back, that persists. Mild dyspnea. Can't tell me if exertional or not. Thinks similar to previous heart attack. No vomiting or diarrhea.    Review of Systems: As per HPI otherwise 10 point review of systems negative.    Past Medical History:  Diagnosis Date   CAD in native artery    a. 06/28/2014: STEMI, cath LM nl, mLAD 50%, mLCx 20%, OM1 small in size 80%, ramus 100% s/p PCI/DES 0%, EF 40%    Carotid artery disease (HCC)    CHF (congestive heart failure) (HCC)    Cocaine abuse (HCC)    Depression    H/O gastric ulcer    Hyperglycemia    a. A1C 5.6%   Hypertension    Ischemic cardiomyopathy    a. EF 40% by cath 06/28/2014   MI (myocardial infarction) (HCC)    Polysubstance abuse (HCC)    a. cocaine, tobacco, and alcohol    Tobacco abuse     Past Surgical History:  Procedure Laterality Date   CARDIAC CATHETERIZATION  06/28/2014   x1 stent   CAROTID PTA/STENT INTERVENTION Right 08/31/2022   Procedure: CAROTID PTA/STENT INTERVENTION;  Surgeon: Annice Needy, MD;  Location: [facility];  Service: Cardiovascular;  Laterality: Right;   COLONOSCOPY N/A 08/12/2022   Procedure: COLONOSCOPY;  Surgeon: Toledo, Boykin Nearing, MD;  Location: [facility];  Service: Gastroenterology;  Laterality: N/A;   CORONARY STENT INTERVENTION N/A 08/26/2022   Procedure: CORONARY STENT INTERVENTION;  Surgeon: [name], MD;  Location: [facility];  Service: Cardiovascular;  Laterality: N/A;    ESOPHAGOGASTRODUODENOSCOPY N/A 08/12/2022   Procedure: ESOPHAGOGASTRODUODENOSCOPY (EGD);  Surgeon: Toledo, Boykin Nearing, MD;  Location: [facility];  Service: Gastroenterology;  Laterality: N/A;   LEFT HEART CATH AND CORONARY ANGIOGRAPHY Left 08/26/2022   Procedure: LEFT HEART CATH AND CORONARY ANGIOGRAPHY;  Surgeon: [name], MD;  Location: [facility];  Service: Cardiovascular;  Laterality: Left;   NISSEN FUNDOPLICATION     stomach ulcer surgery       reports that he has been smoking cigarettes. He started smoking about 48 years ago. He has a 48.1 pack-year smoking history. He has never used smokeless tobacco. He reports that he does not currently use alcohol after a past usage of about 35.0 standard drinks of alcohol per week. He reports that he does not use drugs.  Allergies  Allergen Reactions   Oxycodone-Acetaminophen Other (See Comments)   Codeine Rash    Family History  Problem Relation Age of Onset   Heart failure Mother    Stroke Father    Heart failure Father    Prostate cancer Brother    Heart attack Brother     Prior to Admission medications   Medication Sig Start Date End Date Taking? Authorizing Provider  aspirin 81 MG tablet Take 81 mg by mouth daily.    [provider]  clopidogrel (PLAVIX) 75 MG tablet Take 1 tablet by mouth once daily 08/11/23   Dew, Marlow Baars, MD  DULoxetine (CYMBALTA)  30 MG capsule Take 30 mg by mouth daily. 02/04/22   [provider]  fluticasone-salmeterol (ADVAIR DISKUS) 250-50 MCG/ACT AEPB Inhale 1 puff into the lungs in the morning and at bedtime. 10/27/22   Sallyanne Kuster, NP  hydrOXYzine (VISTARIL) 25 MG capsule Take 25 mg by mouth 2 (two) times daily. 01/22/22   [provider]  irbesartan (AVAPRO) 150 MG tablet Take 1 tablet (150 mg total) by mouth daily. 12/10/22   Furth, Cadence H, PA-C  isosorbide mononitrate (IMDUR) 60 MG 24 hr tablet Take 1 tablet (60 mg total) by mouth in the morning and at  bedtime. 04/21/23   Furth, Cadence H, PA-C  metoprolol succinate (TOPROL-XL) 50 MG 24 hr tablet Take 1.5 tablets (75 mg total) by mouth daily. Take with or immediately following a meal. 03/12/23   Furth, Cadence H, PA-C  nicotine (NICODERM CQ - DOSED IN MG/24 HOURS) 21 mg/24hr patch 21 mg daily. Patient not taking: Reported on 04/21/2023 09/17/22   [provider]  nitroGLYCERIN (NITROSTAT) 0.4 MG SL tablet Place 1 tablet (0.4 mg total) under the tongue every 5 (five) minutes as needed for chest pain. 04/21/23   Furth, Cadence H, PA-C  pantoprazole (PROTONIX) 40 MG tablet Take 40 mg by mouth daily. 02/04/22   [provider]  QUEtiapine (SEROQUEL XR) 50 MG TB24 24 hr tablet Take 100 mg by mouth daily. Take 2 tablets by mouth daily at bedtime 01/22/22   [provider]  rosuvastatin (CRESTOR) 40 MG tablet Take 40 mg by mouth daily. 12/19/21   [provider]  VENTOLIN HFA 108 (90 Base) MCG/ACT inhaler SMARTSIG:2 Puff(s) By Mouth Every 4 Hours PRN 10/27/22   Sallyanne Kuster, NP    Physical Exam: Vitals:   08/19/23 1630 08/19/23 1655 08/19/23 1700 08/19/23 1705  BP: (!) 177/71 (!) 175/79 (!) 192/92 (!) 184/82  Pulse: (!) 116 96 (!) 104 98  Resp: (!) 23 (!) 23 19 (!) 21  Temp:      TempSrc:      SpO2: 95% 97% 97% 97%  Weight:      Height:        Constitutional: in distress Head: Atraumatic Eyes: Conjunctiva clear ENM: Moist mucous membranes. Normal dentition.  Neck: Supple Respiratory: Clear to auscultation bilaterally, no wheezing/rales/rhonchi.   Cardiovascular: tachycardic Abdomen: Non-tender, non-distended.  Musculoskeletal: No joint deformity upper and lower extremities.   Skin: No rashes, lesions, or ulcers.  Extremities: No peripheral edema. Palpable peripheral pulses. Neurologic: Alert, moving all 4 extremities.   Labs on Admission: I have personally reviewed following labs and imaging studies  CBC: Recent Labs  Lab 08/19/23 1518  WBC 9.0   HGB 16.0  HCT 45.9  MCV 92.2  PLT 285   Basic Metabolic Panel: Recent Labs  Lab 08/19/23 1518  NA 140  K 3.8  CL 98  CO2 23  GLUCOSE 107*  BUN 14  CREATININE 0.87  CALCIUM 9.3   GFR: Estimated Creatinine Clearance: 87.7 mL/min (by C-G formula based on SCr of 0.87 mg/dL). Liver Function Tests: No results for input(s): "AST", "ALT", "ALKPHOS", "BILITOT", "PROT", "ALBUMIN" in the last 168 hours. No results for input(s): "LIPASE", "AMYLASE" in the last 168 hours. No results for input(s): "AMMONIA" in the last 168 hours. Coagulation Profile: Recent Labs  Lab 08/19/23 1518  INR 0.9   Cardiac Enzymes: No results for input(s): "CKTOTAL", "CKMB", "CKMBINDEX", "TROPONINI" in the last 168 hours. BNP (last 3 results) No results for input(s): "PROBNP" in the last  8760 hours. HbA1C: No results for input(s): "HGBA1C" in the last 72 hours. CBG: No results for input(s): "GLUCAP" in the last 168 hours. Lipid Profile: No results for input(s): "CHOL", "HDL", "LDLCALC", "TRIG", "CHOLHDL", "LDLDIRECT" in the last 72 hours. Thyroid Function Tests: No results for input(s): "TSH", "T4TOTAL", "FREET4", "T3FREE", "THYROIDAB" in the last 72 hours. Anemia Panel: No results for input(s): "VITAMINB12", "FOLATE", "FERRITIN", "TIBC", "IRON", "RETICCTPCT" in the last 72 hours. Urine analysis:    Component Value Date/Time   COLORURINE YELLOW (A) 10/19/2016 1238   APPEARANCEUR CLEAR (A) 10/19/2016 1238   LABSPEC 1.023 10/19/2016 1238   PHURINE 5.0 10/19/2016 1238   GLUCOSEU NEGATIVE 10/19/2016 1238   HGBUR NEGATIVE 10/19/2016 1238   BILIRUBINUR NEGATIVE 10/19/2016 1238   KETONESUR NEGATIVE 10/19/2016 1238   PROTEINUR NEGATIVE 10/19/2016 1238   NITRITE NEGATIVE 10/19/2016 1238   LEUKOCYTESUR NEGATIVE 10/19/2016 1238    Radiological Exams on Admission: CT Angio Chest/Abd/Pel for Dissection W and/or Wo Contrast Result Date: 08/19/2023 CLINICAL DATA:  Chest pain, arm numbness, history of  coronary stenting EXAM: CT ANGIOGRAPHY CHEST, ABDOMEN AND PELVIS TECHNIQUE: Non-contrast CT of the chest was initially obtained. Multidetector CT imaging through the chest, abdomen and pelvis was performed using the standard protocol during bolus administration of intravenous contrast. Multiplanar reconstructed images and MIPs were obtained and reviewed to evaluate the vascular anatomy. RADIATION DOSE REDUCTION: This exam was performed according to the departmental dose-optimization program which includes automated exposure control, adjustment of the mA and/or kV according to patient size and/or use of iterative reconstruction technique. CONTRAST:  OMNIPAQUE IOHEXOL 350 MG/ML SOLN COMPARISON:  CT angiogram chest abdomen pelvis, 06/28/2014 FINDINGS: CTA CHEST FINDINGS VASCULAR Aorta: Satisfactory opacification of the aorta. Normal contour and caliber of the thoracic aorta. Incidental note of bovine type two-vessel branching pattern of the aortic arch. No evidence of aneurysm, dissection, or other acute aortic pathology. Moderate mixed calcific atherosclerosis. Cardiovascular: No evidence of pulmonary embolism on limited non-tailored examination. Normal heart size. Three-vessel coronary artery calcifications and stents. No pericardial effusion. Review of the MIP images confirms the above findings. NON VASCULAR Mediastinum/Nodes: No enlarged mediastinal, hilar, or axillary lymph nodes. Thyroid gland, trachea, and esophagus demonstrate no significant findings. Lungs/Pleura: Examination of the lungs is limited by breath motion artifact. Diffuse bilateral bronchial wall thickening. Background of fine centrilobular nodularity, most concentrated in the lung apices. No pleural effusion or pneumothorax. Musculoskeletal: No chest wall abnormality. No acute osseous findings. Review of the MIP images confirms the above findings. CTA ABDOMEN AND PELVIS FINDINGS VASCULAR Normal contour and caliber of the abdominal aorta.  Severe mixed calcific atherosclerosis. No evidence of aneurysm, dissection, or other acute aortic pathology. Standard branching pattern of the abdominal aorta with solitary bilateral renal arteries. Review of the MIP images confirms the above findings. NON-VASCULAR Hepatobiliary: No solid liver abnormality is seen. Marked hepatic steatosis. No gallstones, gallbladder wall thickening, or biliary dilatation. Pancreas: Unremarkable. No pancreatic ductal dilatation or surrounding inflammatory changes. Spleen: Normal in size without significant abnormality. Adrenals/Urinary Tract: Adrenal glands are unremarkable. Kidneys are normal, without renal calculi, solid lesion, or hydronephrosis. Mildly distended urinary bladder. Stomach/Bowel: Stomach is within normal limits. Appendix appears normal. No evidence of bowel wall thickening, distention, or inflammatory changes. Descending and sigmoid diverticulosis. Lymphatic: No enlarged abdominal or pelvic lymph nodes. Reproductive: No mass or other significant abnormality. Other: Fat containing bilateral inguinal hernias. Small fat containing umbilical hernia. No ascites. Musculoskeletal: No acute osseous findings. IMPRESSION: 1. Normal contour and caliber of the thoracic  and abdominal aorta. Severe mixed calcific atherosclerosis of the abdominal aorta. No evidence of aneurysm, dissection, or other acute aortic pathology. 2. Coronary artery disease. 3. Diffuse bilateral bronchial wall thickening. Background of fine centrilobular nodularity, most concentrated in the lung apices. Findings are nonspecific and infectious or inflammatory although most commonly seen in smoking-related respiratory bronchiolitis. 4. Hepatic steatosis. 5. Descending and sigmoid diverticulosis without evidence of acute diverticulitis. Aortic Atherosclerosis (ICD10-I70.0). Electronically Signed   By: Jearld Lesch M.D.   On: 08/19/2023 16:27    EKG: Independently reviewed.  tachycardia  Assessment/Plan Active Problems:   Hypertension   CAD in native artery   Ischemic cardiomyopathy   Polysubstance abuse (HCC)   Cocaine abuse (HCC)   Carotid stenosis   HFrEF (heart failure with reduced ejection fraction) (HCC)   # Chest pain # CAD Patient reports it to severe. Does have history CAD but initial troponin only mildly elevated to 60. Bnp is wnl and degree of pain makes pericarditis unlikely. CT angio of chest and abdomen negative for dissection or other acute thoracic or abdominal process. Panic is in the differential but obviously a dx of exclusion. Received aspirin. Uds neg. - cardiology consulted - start heparin - nitroglycerine, morphine - consider nitroglycerine drip if reponds to the sublingual, vs nicardipine - TTE - f/u dimer but if large symptomatic pe would likely have been seen on CTA, would have hypoxia, would have elevated bnp, etc.  # Tachycardia Possibly a fib, per dr. Elberta Fortis hard to tell. Received dilt, amio in the ER, hr now improved to 100-110. - repeat EKG ordered - TSH  # Hypertensive urgency Vs emergency if this is acs - nitroglycerine vs nicardipine as above  # Carotid artery stenosis Has stent in right carotid  # Alcohol use disorder Last drink earlier today, etoh level 151 - ciwa lorazepam ordered  # MDD - cont home seroquel, duloxetine  # COPD - dulera for home inhaler  DVT prophylaxis: heparin IV Code Status: full  Family Communication: friend updated @ bedsidd  Consults called: cardiology   Level of care: inpt    Silvano Bilis MD [facility] Pager [phone]  If 7PM-7AM, please contact night-coverage [email or web address] Password [facility]  08/19/2023, 5:48 PM

## 2023-08-20 ENCOUNTER — Inpatient Hospital Stay: Payer: Medicare Other

## 2023-08-20 ENCOUNTER — Other Ambulatory Visit: Payer: Medicaid Other

## 2023-08-20 ENCOUNTER — Inpatient Hospital Stay (HOSPITAL_COMMUNITY)
Admit: 2023-08-20 | Discharge: 2023-08-20 | Disposition: A | Discharge status: Home / Self Care | Payer: Medicare Other | Attending: Obstetrics and Gynecology | Admitting: Obstetrics and Gynecology

## 2023-08-20 DIAGNOSIS — I4891 Unspecified atrial fibrillation: Secondary | ICD-10-CM

## 2023-08-20 DIAGNOSIS — R079 Chest pain, unspecified: Secondary | ICD-10-CM

## 2023-08-20 LAB — CBC
HCT: 46.4 % (ref 39.0–52.0)
Hemoglobin: 16.2 g/dL (ref 13.0–17.0)
MCH: 32 pg (ref 26.0–34.0)
MCHC: 34.9 g/dL (ref 30.0–36.0)
MCV: 91.5 fL (ref 80.0–100.0)
Platelets: 278 10*3/uL (ref 150–400)
RBC: 5.07 MIL/uL (ref 4.22–5.81)
RDW: 13.5 % (ref 11.5–15.5)
WBC: 8.2 10*3/uL (ref 4.0–10.5)
nRBC: 0 % (ref 0.0–0.2)

## 2023-08-20 LAB — HEPARIN LEVEL (UNFRACTIONATED)
Heparin Unfractionated: 0.39 [IU]/mL (ref 0.30–0.70)
Heparin Unfractionated: 0.33 [IU]/mL (ref 0.30–0.70)

## 2023-08-20 LAB — LIPID PANEL
Cholesterol: 189 mg/dL (ref 0–200)
HDL: 83 mg/dL (ref >40)
LDL Cholesterol: 78 mg/dL (ref 0–99)
Total CHOL/HDL Ratio: 2.3 {ratio}
Triglycerides: 141 mg/dL (ref <150)
VLDL: 28 mg/dL (ref 0–40)

## 2023-08-20 LAB — ECHOCARDIOGRAM COMPLETE
AR max vel: 2.37 cm2
AV Area VTI: 2.65 cm2
AV Area mean vel: 2.28 cm2
AV Mean grad: 2 mm[Hg]
AV Peak grad: 3.9 mm[Hg]
Ao pk vel: 0.98 m/s
Area-P 1/2: 7.16 cm2
Height: 63 in
S' Lateral: 2.2 cm
Weight: 2830.4 [oz_av]

## 2023-08-20 LAB — RESP PANEL BY RT-PCR (RSV, FLU A&B, COVID)  RVPGX2
Influenza A by PCR: NEGATIVE
Influenza B by PCR: NEGATIVE
Resp Syncytial Virus by PCR: NEGATIVE
SARS Coronavirus 2 by RT PCR: NEGATIVE

## 2023-08-20 LAB — HEMOGLOBIN AND HEMATOCRIT, BLOOD
HCT: 44.7 % (ref 39.0–52.0)
Hemoglobin: 15.4 g/dL (ref 13.0–17.0)

## 2023-08-20 LAB — CBG MONITORING, ED: Glucose-Capillary: 114 mg/dL — ABNORMAL HIGH (ref 70–99)

## 2023-08-20 LAB — TROPONIN I (HIGH SENSITIVITY): Troponin I (High Sensitivity): 34 ng/L — ABNORMAL HIGH (ref <18)

## 2023-08-20 LAB — MAGNESIUM: Magnesium: 1.9 mg/dL (ref 1.7–2.4)

## 2023-08-20 LAB — TYPE AND SCREEN
ABO/RH(D): A POS
Antibody Screen: NEGATIVE

## 2023-08-20 MED ORDER — CLOPIDOGREL BISULFATE 75 MG PO TABS
75.0000 mg | ORAL_TABLET | Freq: Every day | ORAL | Status: DC
  Administered 2023-08-20 – 2023-08-21 (×2): 75 mg via ORAL
  Filled 2023-08-20 (×2): qty 1

## 2023-08-20 MED ORDER — ISOSORBIDE MONONITRATE ER 60 MG PO TB24: 60.0000 mg | ORAL_TABLET | Freq: Every day | ORAL | Status: DC

## 2023-08-20 MED ORDER — ASPIRIN 81 MG PO TBEC
81.0000 mg | DELAYED_RELEASE_TABLET | Freq: Every day | ORAL | Status: DC
  Administered 2023-08-20 – 2023-08-21 (×2): 81 mg via ORAL
  Filled 2023-08-20 (×2): qty 1

## 2023-08-20 MED ORDER — ASPIRIN 81 MG PO TBEC: 81.0000 mg | DELAYED_RELEASE_TABLET | Freq: Every day | ORAL | Status: DC

## 2023-08-20 MED ORDER — ONDANSETRON HCL 4 MG/2ML IJ SOLN: 4.0000 mg | Freq: Four times a day (QID) | INTRAMUSCULAR | Status: DC | PRN

## 2023-08-20 MED ORDER — METOPROLOL SUCCINATE ER 50 MG PO TB24
75.0000 mg | ORAL_TABLET | Freq: Every day | ORAL | Status: DC
  Administered 2023-08-20 – 2023-08-21 (×2): 75 mg via ORAL
  Filled 2023-08-20: qty 1
  Filled 2023-08-20: qty 2

## 2023-08-20 MED ORDER — ACETAMINOPHEN 325 MG PO TABS
650.0000 mg | ORAL_TABLET | ORAL | Status: DC | PRN
Start: 2023-08-20 — End: 2023-08-21
  Filled 2023-08-20: qty 2

## 2023-08-20 MED ORDER — NITROGLYCERIN 0.4 MG SL SUBL: 0.4000 mg | SUBLINGUAL_TABLET | SUBLINGUAL | Status: DC | PRN

## 2023-08-20 MED ORDER — NICOTINE 21 MG/24HR TD PT24
21.0000 mg | MEDICATED_PATCH | Freq: Every day | TRANSDERMAL | Status: DC
  Administered 2023-08-20: 21 mg via TRANSDERMAL
  Filled 2023-08-20: qty 1

## 2023-08-20 MED ORDER — ISOSORBIDE MONONITRATE ER 60 MG PO TB24
60.0000 mg | ORAL_TABLET | Freq: Two times a day (BID) | ORAL | Status: DC
  Administered 2023-08-20 – 2023-08-21 (×3): 60 mg via ORAL
  Filled 2023-08-20 (×3): qty 1

## 2023-08-20 MED ORDER — IRBESARTAN 150 MG PO TABS
150.0000 mg | ORAL_TABLET | Freq: Every day | ORAL | Status: DC
  Administered 2023-08-20 – 2023-08-21 (×2): 150 mg via ORAL
  Filled 2023-08-20 (×2): qty 1

## 2023-08-20 NOTE — ED Notes (Signed)
Pt given ginger ale per request

## 2023-08-20 NOTE — Progress Notes (Signed)
PROGRESS NOTE    Marcus [name]  ZOX:[id number] DOB: 12/29/1965 DOA: 08/19/2023 PCP: Levonne Lapping, NP  Outpatient Specialists: cardiology    Brief Narrative:   From admission h and p  Marcus [name] is a 58 y.o. male with medical history significant for cad s/p stent x2, polysubstance abuse (etoh, cocaine), htn, hfpef, carotis stenosis s/p stent, presenting with the above.   Denies recent cocaine but has been drinking today. Developed while seated severe substernal chest pain radiating to his back, that persists. Mild dyspnea. Can't tell me if exertional or not. Thinks similar to previous heart attack. No vomiting or diarrhea.   Assessment & Plan:   Principal Problem:   Chest pain Active Problems:   Hypertension   CAD in native artery   Ischemic cardiomyopathy   Polysubstance abuse (HCC)   Cocaine abuse (HCC)   Carotid stenosis   HFrEF (heart failure with reduced ejection fraction) (HCC)   # Chest pain # CAD Reports severe substernal chest pain radiating to back on presentation. Does have history CAD but only mild trop elevation. Bnp is wnl and degree of pain makes pericarditis unlikely. CT angio of chest and abdomen negative for dissection or other acute thoracic or abdominal process. Panic is in the differential but obviously a dx of exclusion. Received aspirin. Uds neg. Started on heparin. Chest pain resolved today but remains very hypertensive - cardiology consulted - cont heparin - cont prn nitroglycerine, morphine - consider nitroglycerine drip vs nicardipine, defer to cardiology - TTE ordered - dimer is elevated but if this is a symptomatic pe would probably have been seen on CTA, would have hypoxia, would have elevated bnp, etc. Will hold on PE eval for the moment   # Tachycardia Possibly a fib, per dr. Elberta Fortis hard to tell. Received dilt, amio in the ER, hr now improved to 100-110, in sinus. TFTs wnl - per cardiology   # Hypertensive urgency Vs emergency  if this is acs - nitroglycerine vs nicardipine as above   # Carotid artery stenosis Has stent in right carotid  # Cough Today reports several days mild cough - check covid/flu/rsv   # Alcohol use disorder Last drink earlier in the day on 1/30, etoh level 151. Denies history of withdrawal, doesn't appear to be withdrawing currently - ciwa lorazepam ordered   # MDD - cont home seroquel, duloxetine   # COPD - dulera for home inhaler   DVT prophylaxis: heparin IV Code Status: full  Family Communication: friend updated @ bedside 1/30    Consultants:  cardiology  Procedures: None thus far  Antimicrobials:  none    Subjective: Reports resolution of chest pain, denies dyspnea  Objective: Vitals:   08/20/23 0345 08/20/23 0400 08/20/23 0700 08/20/23 0702  BP:   (!) 199/100 (!) 199/100  Pulse: 92 88 91 93  Resp: (!) 24 (!) 21 (!) 21 (!) 24  Temp:   98.1 F (36.7 C)   TempSrc:   Oral   SpO2: 90% 97% 94% 96%  Weight:      Height:       No intake or output data in the 24 hours ending 08/20/23 0855 Filed Weights   08/19/23 1515 08/19/23 1554  Weight: 81.6 kg 80.2 kg    Examination:  General exam: Appears calm and comfortable  Respiratory system: Clear to auscultation. Respiratory effort normal. Cardiovascular system: S1 & S2 heard, mild tachycardia, rr. Soft systolic murmury Gastrointestinal system: Abdomen is obese, soft and nontender.   Central  nervous system: Alert and oriented. No focal neurological deficits. Extremities: Symmetric 5 x 5 power. Skin: No rashes, lesions or ulcers Psychiatry: Judgement and insight appear normal. Mood & affect appropriate.     Data Reviewed: I have personally reviewed following labs and imaging studies  CBC: Recent Labs  Lab 08/19/23 1518 08/20/23 0051  WBC 9.0 8.2  HGB 16.0 16.2  HCT 45.9 46.4  MCV 92.2 91.5  PLT 285 278   Basic Metabolic Panel: Recent Labs  Lab 08/19/23 1518  NA 140  K 3.8  CL 98  CO2 23   GLUCOSE 107*  BUN 14  CREATININE 0.87  CALCIUM 9.3   GFR: Estimated Creatinine Clearance: 87.7 mL/min (by C-G formula based on SCr of 0.87 mg/dL). Liver Function Tests: No results for input(s): "AST", "ALT", "ALKPHOS", "BILITOT", "PROT", "ALBUMIN" in the last 168 hours. No results for input(s): "LIPASE", "AMYLASE" in the last 168 hours. No results for input(s): "AMMONIA" in the last 168 hours. Coagulation Profile: Recent Labs  Lab 08/19/23 1518  INR 0.9   Cardiac Enzymes: No results for input(s): "CKTOTAL", "CKMB", "CKMBINDEX", "TROPONINI" in the last 168 hours. BNP (last 3 results) No results for input(s): "PROBNP" in the last 8760 hours. HbA1C: No results for input(s): "HGBA1C" in the last 72 hours. CBG: No results for input(s): "GLUCAP" in the last 168 hours. Lipid Profile: No results for input(s): "CHOL", "HDL", "LDLCALC", "TRIG", "CHOLHDL", "LDLDIRECT" in the last 72 hours. Thyroid Function Tests: Recent Labs    08/19/23 1914  TSH 1.216  FREET4 0.93   Anemia Panel: No results for input(s): "VITAMINB12", "FOLATE", "FERRITIN", "TIBC", "IRON", "RETICCTPCT" in the last 72 hours. Urine analysis:    Component Value Date/Time   COLORURINE YELLOW (A) 10/19/2016 1238   APPEARANCEUR CLEAR (A) 10/19/2016 1238   LABSPEC 1.023 10/19/2016 1238   PHURINE 5.0 10/19/2016 1238   GLUCOSEU NEGATIVE 10/19/2016 1238   HGBUR NEGATIVE 10/19/2016 1238   BILIRUBINUR NEGATIVE 10/19/2016 1238   KETONESUR NEGATIVE 10/19/2016 1238   PROTEINUR NEGATIVE 10/19/2016 1238   NITRITE NEGATIVE 10/19/2016 1238   LEUKOCYTESUR NEGATIVE 10/19/2016 1238   Sepsis Labs: @LABRCNTIP (procalcitonin:4,lacticidven:4)  )No results found for this or any previous visit (from the past 240 hours).       Radiology Studies: CT Angio Chest/Abd/Pel for Dissection W and/or Wo Contrast Result Date: 08/19/2023 CLINICAL DATA:  Chest pain, arm numbness, history of coronary stenting EXAM: CT ANGIOGRAPHY CHEST,  ABDOMEN AND PELVIS TECHNIQUE: Non-contrast CT of the chest was initially obtained. Multidetector CT imaging through the chest, abdomen and pelvis was performed using the standard protocol during bolus administration of intravenous contrast. Multiplanar reconstructed images and MIPs were obtained and reviewed to evaluate the vascular anatomy. RADIATION DOSE REDUCTION: This exam was performed according to the departmental dose-optimization program which includes automated exposure control, adjustment of the mA and/or kV according to patient size and/or use of iterative reconstruction technique. CONTRAST:  OMNIPAQUE IOHEXOL 350 MG/ML SOLN COMPARISON:  CT angiogram chest abdomen pelvis, 06/28/2014 FINDINGS: CTA CHEST FINDINGS VASCULAR Aorta: Satisfactory opacification of the aorta. Normal contour and caliber of the thoracic aorta. Incidental note of bovine type two-vessel branching pattern of the aortic arch. No evidence of aneurysm, dissection, or other acute aortic pathology. Moderate mixed calcific atherosclerosis. Cardiovascular: No evidence of pulmonary embolism on limited non-tailored examination. Normal heart size. Three-vessel coronary artery calcifications and stents. No pericardial effusion. Review of the MIP images confirms the above findings. NON VASCULAR Mediastinum/Nodes: No enlarged mediastinal, hilar, or axillary lymph nodes.  Thyroid gland, trachea, and esophagus demonstrate no significant findings. Lungs/Pleura: Examination of the lungs is limited by breath motion artifact. Diffuse bilateral bronchial wall thickening. Background of fine centrilobular nodularity, most concentrated in the lung apices. No pleural effusion or pneumothorax. Musculoskeletal: No chest wall abnormality. No acute osseous findings. Review of the MIP images confirms the above findings. CTA ABDOMEN AND PELVIS FINDINGS VASCULAR Normal contour and caliber of the abdominal aorta. Severe mixed calcific atherosclerosis. No  evidence of aneurysm, dissection, or other acute aortic pathology. Standard branching pattern of the abdominal aorta with solitary bilateral renal arteries. Review of the MIP images confirms the above findings. NON-VASCULAR Hepatobiliary: No solid liver abnormality is seen. Marked hepatic steatosis. No gallstones, gallbladder wall thickening, or biliary dilatation. Pancreas: Unremarkable. No pancreatic ductal dilatation or surrounding inflammatory changes. Spleen: Normal in size without significant abnormality. Adrenals/Urinary Tract: Adrenal glands are unremarkable. Kidneys are normal, without renal calculi, solid lesion, or hydronephrosis. Mildly distended urinary bladder. Stomach/Bowel: Stomach is within normal limits. Appendix appears normal. No evidence of bowel wall thickening, distention, or inflammatory changes. Descending and sigmoid diverticulosis. Lymphatic: No enlarged abdominal or pelvic lymph nodes. Reproductive: No mass or other significant abnormality. Other: Fat containing bilateral inguinal hernias. Small fat containing umbilical hernia. No ascites. Musculoskeletal: No acute osseous findings. IMPRESSION: 1. Normal contour and caliber of the thoracic and abdominal aorta. Severe mixed calcific atherosclerosis of the abdominal aorta. No evidence of aneurysm, dissection, or other acute aortic pathology. 2. Coronary artery disease. 3. Diffuse bilateral bronchial wall thickening. Background of fine centrilobular nodularity, most concentrated in the lung apices. Findings are nonspecific and infectious or inflammatory although most commonly seen in smoking-related respiratory bronchiolitis. 4. Hepatic steatosis. 5. Descending and sigmoid diverticulosis without evidence of acute diverticulitis. Aortic Atherosclerosis (ICD10-I70.0). Electronically Signed   By: Jearld Lesch M.D.   On: 08/19/2023 16:27        Scheduled Meds:  DULoxetine  30 mg Oral Daily   folic acid  1 mg Oral Daily   isosorbide  mononitrate  60 mg Oral Daily   metoprolol succinate  75 mg Oral Daily   mometasone-formoterol  2 puff Inhalation BID   multivitamin with minerals  1 tablet Oral Daily   QUEtiapine  200 mg Oral QHS   rosuvastatin  40 mg Oral Daily   thiamine  100 mg Oral Daily   Or   thiamine  100 mg Intravenous Daily   Continuous Infusions:  heparin 900 Units/hr (08/19/23 1843)     LOS: 1 day     Silvano Bilis, MD [facility]   If 7PM-7AM, please contact night-coverage [email or web address] Password [facility] 08/20/2023, 8:55 AM

## 2023-08-20 NOTE — ED Notes (Signed)
Called Lab to add on the troponin to the magnesium that was sent down at 0951.

## 2023-08-20 NOTE — Consult Note (Signed)
PHARMACY - ANTICOAGULATION CONSULT NOTE  Pharmacy Consult for Heparin Indication: chest pain/ACS  Allergies  Allergen Reactions   Oxycodone-Acetaminophen Other (See Comments)   Codeine Rash   Patient Measurements: Height: 5\' 3"  (160 cm) Weight: 80.2 kg (176 lb 14.4 oz) IBW/kg (Calculated) : 56.9 Heparin Dosing Weight: 74.3 kg   Vital Signs: Temp: 98 F (36.7 C) (01/30 2318) Temp Source: Axillary (01/30 2318) BP: 191/94 (01/30 2300) Pulse Rate: 86 (01/30 2315)  Labs: Recent Labs    08/19/23 1518 08/19/23 1743 08/19/23 1914 08/20/23 0051  HGB 16.0  --   --  16.2  HCT 45.9  --   --  46.4  PLT 285  --   --  278  APTT  --   --  149*  --   LABPROT 12.3  --   --   --   INR 0.9  --   --   --   HEPARINUNFRC  --   --   --  0.39  CREATININE 0.87  --   --   --   TROPONINIHS 62* 70*  --   --    Estimated Creatinine Clearance: 87.7 mL/min (by C-G formula based on SCr of 0.87 mg/dL).  Medical History: Past Medical History:  Diagnosis Date   CAD in native artery    a. 06/28/2014: STEMI, cath LM nl, mLAD 50%, mLCx 20%, OM1 small in size 80%, ramus 100% s/p PCI/DES 0%, EF 40%    Carotid artery disease (HCC)    CHF (congestive heart failure) (HCC)    Cocaine abuse (HCC)    Depression    H/O gastric ulcer    Hyperglycemia    a. A1C 5.6%   Hypertension    Ischemic cardiomyopathy    a. EF 40% by cath 06/28/2014   MI (myocardial infarction) (HCC)    Polysubstance abuse (HCC)    a. cocaine, tobacco, and alcohol    Tobacco abuse    Medications:  No PTA anticoagulation noted from chart review   Assessment: Marcus [name] is a 58 year old male that presented with chest pain and numbness to both arms. Elevated troponin of 62. From chart review, patient is not on any PTA anticoagulation. Pharmacy has been consulted for initation and management of heparin infusion. Baseline labs: Hgb 16.0, PLT 285, INR 0.9, aPTT has been ordered.   Goal of Therapy:  Heparin level 0.3-0.7  units/ml Monitor platelets by anticoagulation protocol: Yes   Plan:  1/31:  HL @ 0051 = 0.39, therapeutic X 1 - Will continue pt on current rate and recheck HL on 1/31 @ 0700.  Monitor CBC daily and for signs/symptoms of bleeding   Marcus [name] D, PharmD 08/20/2023 1:13 AM

## 2023-08-20 NOTE — Consult Note (Signed)
Cardiology Consultation   Patient ID: Marcus [name] MRN: [id number]; DOB: [birth year]  Admit date: 08/19/2023 Date of Consult: 08/20/2023  PCP:  Levonne Lapping, NP   [facility] Providers Cardiologist:  Julien Nordmann, MD   {  Patient Profile:   Marcus [name] is a 58 y.o. male with a hx of CAD wuth stenting to the RI in 2015, HFrEF 2/2 ICM, carotid artery disease s/p right carotid stenting 07/31/2022, polysubstance use (ETOH and cocaine use) HTN, and HLD who is being seen 08/20/2023 for the evaluation of chest pain and tachycardia at the request of Dr. Ashok Pall.  History of Present Illness:   Marcus [name] was seen January 2024 reported chest pain and dizziness. Carotid US showed 6079% stenosis in the right internal carotid artery with 80-90% in the left internal carotid artery and left subclavian artery stenosis. STAT CTA head and neck showed severe focal stenosis 80%at the pRVA and left ICA with 50% stenosis. The patient underwent carotid stenting 08/31/22.  Cardiac cath prior to carotid stenting on 08/26/22 showed severe single vessel CAD with 50-60% ISR of RI stent and 90% de novo lesion distal to the stent, mild to moderate non-critical disease, mildly elevated LVEDP . The patient was treated with successful PCI and intracoronary lithotripsy to RI with reduction of ISR to 20-30% and reduction of de novo lesion to 0%. The patient was started on Imdur for refractory chest pain.   Heart monitor for syncope showed predominately NSR with an average HR of 76bpm, 1 run of NSVT lasting 5 beats, 2 runs of SVT.   The patient was last seen 04/21/23 reporting chest pain. Imdur was increased to 60mg  BID. Considered cath if chest pain continued.   The patient presented to the ER for chest pain and tachycardia. Chest pain started yesterday after buying a truck. Pain was in the center of the chest. It lasted a couple hours. Similar pain to prior stenting. Also had SOB. He also felt  palpitations, lightheadedness and dizziness. Reports he has not been on cardiac medications for a month.   He drank alcohol yesterday, 2 beers before he lost consciousness. He was at home, said he stood up and got dizzy and blacked out. He has been drinking alcohol most days 3 beers- 12 pack. Denies drug use. Reports LOC 3 weeks ago when he was drinking.   In the ER BP 109/82, HR 161bpm, RR 24, 92%O2, afebrile. HS troponin 62>70. Ddimer 1.07, ethanol 151. UDS negative. BNP 47. CTA chest/abd/pelvic showed no anuerysm, dissection or other acute pathology. He was given a SL NTG in the ER and this improved the pain.   He reports mild chest pain, 5/10   Past Medical History:  Diagnosis Date   CAD in native artery    a. 06/28/2014: STEMI, cath LM nl, mLAD 50%, mLCx 20%, OM1 small in size 80%, ramus 100% s/p PCI/DES 0%, EF 40%    Carotid artery disease (HCC)    CHF (congestive heart failure) (HCC)    Cocaine abuse (HCC)    Depression    H/O gastric ulcer    Hyperglycemia    a. A1C 5.6%   Hypertension    Ischemic cardiomyopathy    a. EF 40% by cath 06/28/2014   MI (myocardial infarction) (HCC)    Polysubstance abuse (HCC)    a. cocaine, tobacco, and alcohol    Tobacco abuse     Past Surgical History:  Procedure Laterality Date   CARDIAC CATHETERIZATION  06/28/2014  x1 stent   CAROTID PTA/STENT INTERVENTION Right 08/31/2022   Procedure: CAROTID PTA/STENT INTERVENTION;  Surgeon: Annice Needy, MD;  Location: [facility];  Service: Cardiovascular;  Laterality: Right;   COLONOSCOPY N/A 08/12/2022   Procedure: COLONOSCOPY;  Surgeon: Toledo, Boykin Nearing, MD;  Location: [facility] ENDOS[facility];  Service: Gastroenterology;  Laterality: N/A;   CORONARY STENT INTERVENTION N/A 08/26/2022   Procedure: CORONARY STENT INTERVENTION;  Surgeon: [name], MD;  Location: [facility];  Service: Cardiovascular;  Laterality: N/A;   ESOPHAGOGASTRODUODENOSCOPY N/A 08/12/2022   Procedure:  ESOPHAGOGASTRODUODENOSCOPY (EGD);  Surgeon: Toledo, Boykin Nearing, MD;  Location: [facility] ENDOSCOPY;  Service: Gastroenterology;  Laterality: N/A;   LEFT HEART CATH AND CORONARY ANGIOGRAPHY Left 08/26/2022   Procedure: LEFT HEART CATH AND CORONARY ANGIOGRAPHY;  Surgeon: [name], MD;  Location: [facility];  Service: Cardiovascular;  Laterality: Left;   NISSEN FUNDOPLICATION     stomach ulcer surgery       Home Medications:  Prior to Admission medications   Medication Sig Start Date End Date Taking? Authorizing Provider  DULoxetine (CYMBALTA) 60 MG capsule Take 60 mg by mouth daily. 08/11/23  Yes [provider]  hydrOXYzine (VISTARIL) 25 MG capsule Take 25 mg by mouth 2 (two) times daily. 01/22/22  Yes [provider]  nicotine (NICODERM CQ - DOSED IN MG/24 HOURS) 21 mg/24hr patch Place 21 mg onto the skin daily as needed. 09/17/22  Yes [provider]  nitroGLYCERIN (NITROSTAT) 0.4 MG SL tablet Place 1 tablet (0.4 mg total) under the tongue every 5 (five) minutes as needed for chest pain. 04/21/23  Yes Tekelia Kareem H, PA-C  QUEtiapine (SEROQUEL) 200 MG tablet Take 200 mg by mouth at bedtime. 08/11/23  Yes [provider]  rosuvastatin (CRESTOR) 40 MG tablet Take 40 mg by mouth daily. 12/19/21  Yes [provider]  VENTOLIN HFA 108 (90 Base) MCG/ACT inhaler SMARTSIG:2 Puff(s) By Mouth Every 4 Hours PRN Patient taking differently: Inhale 1-2 puffs into the lungs every 4 (four) hours as needed for shortness of breath or wheezing. SMARTSIG:2 Puff(s) By Mouth Every 4 Hours PRN 10/27/22  Yes Abernathy, Arlyss Repress, NP  aspirin 81 MG tablet Take 81 mg by mouth daily. Patient not taking: Reported on 08/19/2023    [provider]  clopidogrel (PLAVIX) 75 MG tablet Take 1 tablet by mouth once daily Patient not taking: Reported on 08/19/2023 08/11/23   Annice Needy, MD  fluticasone-salmeterol (ADVAIR DISKUS) 250-50 MCG/ACT AEPB Inhale 1 puff into the lungs  in the morning and at bedtime. Patient not taking: Reported on 08/19/2023 10/27/22   Sallyanne Kuster, NP  irbesartan (AVAPRO) 150 MG tablet Take 1 tablet (150 mg total) by mouth daily. Patient not taking: Reported on 08/19/2023 12/10/22   Isaac Dubie H, PA-C  isosorbide mononitrate (IMDUR) 60 MG 24 hr tablet Take 1 tablet (60 mg total) by mouth in the morning and at bedtime. Patient not taking: Reported on 08/19/2023 04/21/23   Maddyx Wieck H, PA-C  metoprolol succinate (TOPROL-XL) 50 MG 24 hr tablet Take 1.5 tablets (75 mg total) by mouth daily. Take with or immediately following a meal. Patient not taking: Reported on 08/19/2023 03/12/23   Lynx Goodrich H, PA-C  pantoprazole (PROTONIX) 40 MG tablet Take 40 mg by mouth daily. Patient not taking: Reported on 08/19/2023 02/04/22   [provider]  SPIRIVA RESPIMAT 1.25 MCG/ACT AERS Inhale 2 sprays into the lungs daily. Patient not taking: Reported on 08/19/2023 04/20/23  [provider]    Inpatient Medications: Scheduled Meds:  DULoxetine  30 mg Oral Daily   folic acid  1 mg Oral Daily   isosorbide mononitrate  60 mg Oral Daily   metoprolol succinate  75 mg Oral Daily   mometasone-formoterol  2 puff Inhalation BID   multivitamin with minerals  1 tablet Oral Daily   QUEtiapine  200 mg Oral QHS   rosuvastatin  40 mg Oral Daily   thiamine  100 mg Oral Daily   Or   thiamine  100 mg Intravenous Daily   Continuous Infusions:  heparin 900 Units/hr (08/19/23 1843)   PRN Meds: LORazepam **OR** LORazepam, morphine injection, nitroGLYCERIN  Allergies:    Allergies  Allergen Reactions   Oxycodone-Acetaminophen Other (See Comments)   Codeine Rash    Social History:   Social History   Socioeconomic History   Marital status: Single    Spouse name: Not on file   Number of children: Not on file   Years of education: Not on file   Highest education level: Not on file  Occupational History   Not on file  Tobacco Use    Smoking status: Every Day    Current packs/day: 1.00    Average packs/day: 1 pack/day for 48.1 years (48.1 ttl pk-yrs)    Types: Cigarettes    Start date: 07/21/1975   Smokeless tobacco: Never   Tobacco comments:    Pt. Is actively trying to quit and is wearing the patch.   Vaping Use   Vaping status: Never Used  Substance and Sexual Activity   Alcohol use: Not Currently    Alcohol/week: 35.0 standard drinks of alcohol    Types: 35 Cans of beer per week   Drug use: No    Types: Cocaine, Marijuana    Comment: none in at least 2 years   Sexual activity: Not on file  Other Topics Concern   Not on file  Social History Narrative   Not on file   Social Drivers of Health   Financial Resource Strain: Not on file  Food Insecurity: Not on file  Transportation Needs: Not on file  Physical Activity: Not on file  Stress: Not on file  Social Connections: Not on file  Intimate Partner Violence: Not on file    Family History:    Family History  Problem Relation Age of Onset   Heart failure Mother    Stroke Father    Heart failure Father    Prostate cancer Brother    Heart attack Brother      ROS:  Please see the history of present illness.   All other ROS reviewed and negative.     Physical Exam/Data:   Vitals:   08/20/23 0345 08/20/23 0400 08/20/23 0700 08/20/23 0702  BP:   (!) 199/100 (!) 199/100  Pulse: 92 88 91 93  Resp: (!) 24 (!) 21 (!) 21 (!) 24  Temp:   98.1 F (36.7 C)   TempSrc:   Oral   SpO2: 90% 97% 94% 96%  Weight:      Height:       No intake or output data in the 24 hours ending 08/20/23 0811    08/19/2023    3:54 PM 08/19/2023    3:15 PM 04/21/2023    1:24 PM  Last 3 Weights  Weight (lbs) 176 lb 14.4 oz 180 lb 175 lb 9.6 oz  Weight (kg) 80.241 kg 81.647 kg 79.652 kg  Body mass index is 31.34 kg/m.  General:  Well nourished, well developed, in no acute distress HEENT: normal Neck: no JVD Vascular: No carotid bruits; Distal pulses 2+  bilaterally Cardiac:  normal S1, S2; RRR; no murmur  Lungs:  clear to auscultation bilaterally, no wheezing, rhonchi or rales  Abd: soft, nontender, no hepatomegaly  Ext: no edema Musculoskeletal:  No deformities, BUE and BLE strength normal and equal Skin: warm and dry  Neuro:  CNs 2-12 intact, no focal abnormalities noted Psych:  Normal affect   EKG:  The EKG was personally reviewed and demonstrates:  Afib 157bpm, RBBB Telemetry:  Telemetry was personally reviewed and demonstrates:  Afib RVR 140s>Sinus tach> NSR 80s  Relevant CV Studies:   Heart monitor 11/2022 Event monitor Patch Wear Time:  13 days and 13 hours (2024-04-06T18:22:38-0400 to 2024-04-20T08:13:44-0400)   Normal sinus rhythm Patient had a min HR of 45 bpm, max HR of 169 bpm, and avg HR of 76 bpm.    1 run of Ventricular Tachycardia occurred lasting 5 beats with a max rate of 169 bpm (avg 156 bpm).    2 Supraventricular Tachycardia runs occurred, the run with the fastest interval lasting 5 beats with a max rate of 117 bpm (avg 105 bpm); the run with the fastest interval was also the longest.    Isolated SVEs were rare (<1.0%), SVE Couplets were rare (<1.0%), and no SVE Triplets were present.    Isolated VEs were rare (<1.0%, 230), VE Couplets were rare (<1.0%, 4), and VE Triplets were rare (<1.0%, 1).    Patient triggered events (19) associated with normal sinus rhythm    Cardiac cath 07/2022 Conclusions: Severe single vessel coronary artery disease with 50-60% in-stent restenosis of ramus stent and 90% de-novo lesion just distal to the stent. Mild-moderate, noncritical disease involving the LAD, LCx, and RCA.  There is a 70% stenosis in the apical LAD, which is too small/distal for percutaneous intervention Mildly elevated left ventricular for pressure (LVEDP 19 mmHg). Successful PCI and intracoronary lithotripsy to ramus intermedius with reduction of in-stent restenosis to 20-30% and reduction of de-novo lesion  to 0% with TIMI-3 flow.   Recommendations: Overnight observation. Dual antiplatelet therapy with aspirin and clopidogrel for at least 12 months. Aggressive secondary prevention of coronary artery disease.   [name], MD [facility]   Coronary Diagrams   Diagnostic Dominance: Right  Intervention        Echo 07/2022 1. Left ventricular ejection fraction, by estimation, is 55 to 60%. The  left ventricle has normal function. The left ventricle has no regional  wall motion abnormalities. Left ventricular diastolic parameters are  consistent with Grade II diastolic  dysfunction (pseudonormalization). The average left ventricular global  longitudinal strain is -19.5 %. The global longitudinal strain is normal.   2. Right ventricular systolic function is normal. The right ventricular  size is normal.   3. The mitral valve is normal in structure. Mild mitral valve  regurgitation.   4. The aortic valve is tricuspid. Aortic valve regurgitation is not  visualized.     Laboratory Data:  High Sensitivity Troponin:   Recent Labs  Lab 08/19/23 1518 08/19/23 1743  TROPONINIHS 62* 70*     Chemistry Recent Labs  Lab 08/19/23 1518  NA 140  K 3.8  CL 98  CO2 23  GLUCOSE 107*  BUN 14  CREATININE 0.87  CALCIUM 9.3  GFRNONAA >60  ANIONGAP 19*    No results for input(s): "PROT", "ALBUMIN", "AST", "  ALT", "ALKPHOS", "BILITOT" in the last 168 hours. Lipids No results for input(s): "CHOL", "TRIG", "HDL", "LABVLDL", "LDLCALC", "CHOLHDL" in the last 168 hours.  Hematology Recent Labs  Lab 08/19/23 1518 08/20/23 0051  WBC 9.0 8.2  RBC 4.98 5.07  HGB 16.0 16.2  HCT 45.9 46.4  MCV 92.2 91.5  MCH 32.1 32.0  MCHC 34.9 34.9  RDW 13.7 13.5  PLT 285 278   Thyroid  Recent Labs  Lab 08/19/23 1914  TSH 1.216  FREET4 0.93    BNP Recent Labs  Lab 08/19/23 1518  BNP 47.6    DDimer  Recent Labs  Lab 08/19/23 1914  DDIMER 1.07*     Radiology/Studies:  CT  Angio Chest/Abd/Pel for Dissection W and/or Wo Contrast Result Date: 08/19/2023 CLINICAL DATA:  Chest pain, arm numbness, history of coronary stenting EXAM: CT ANGIOGRAPHY CHEST, ABDOMEN AND PELVIS TECHNIQUE: Non-contrast CT of the chest was initially obtained. Multidetector CT imaging through the chest, abdomen and pelvis was performed using the standard protocol during bolus administration of intravenous contrast. Multiplanar reconstructed images and MIPs were obtained and reviewed to evaluate the vascular anatomy. RADIATION DOSE REDUCTION: This exam was performed according to the departmental dose-optimization program which includes automated exposure control, adjustment of the mA and/or kV according to patient size and/or use of iterative reconstruction technique. CONTRAST:  OMNIPAQUE IOHEXOL 350 MG/ML SOLN COMPARISON:  CT angiogram chest abdomen pelvis, 06/28/2014 FINDINGS: CTA CHEST FINDINGS VASCULAR Aorta: Satisfactory opacification of the aorta. Normal contour and caliber of the thoracic aorta. Incidental note of bovine type two-vessel branching pattern of the aortic arch. No evidence of aneurysm, dissection, or other acute aortic pathology. Moderate mixed calcific atherosclerosis. Cardiovascular: No evidence of pulmonary embolism on limited non-tailored examination. Normal heart size. Three-vessel coronary artery calcifications and stents. No pericardial effusion. Review of the MIP images confirms the above findings. NON VASCULAR Mediastinum/Nodes: No enlarged mediastinal, hilar, or axillary lymph nodes. Thyroid gland, trachea, and esophagus demonstrate no significant findings. Lungs/Pleura: Examination of the lungs is limited by breath motion artifact. Diffuse bilateral bronchial wall thickening. Background of fine centrilobular nodularity, most concentrated in the lung apices. No pleural effusion or pneumothorax. Musculoskeletal: No chest wall abnormality. No acute osseous findings. Review of the  MIP images confirms the above findings. CTA ABDOMEN AND PELVIS FINDINGS VASCULAR Normal contour and caliber of the abdominal aorta. Severe mixed calcific atherosclerosis. No evidence of aneurysm, dissection, or other acute aortic pathology. Standard branching pattern of the abdominal aorta with solitary bilateral renal arteries. Review of the MIP images confirms the above findings. NON-VASCULAR Hepatobiliary: No solid liver abnormality is seen. Marked hepatic steatosis. No gallstones, gallbladder wall thickening, or biliary dilatation. Pancreas: Unremarkable. No pancreatic ductal dilatation or surrounding inflammatory changes. Spleen: Normal in size without significant abnormality. Adrenals/Urinary Tract: Adrenal glands are unremarkable. Kidneys are normal, without renal calculi, solid lesion, or hydronephrosis. Mildly distended urinary bladder. Stomach/Bowel: Stomach is within normal limits. Appendix appears normal. No evidence of bowel wall thickening, distention, or inflammatory changes. Descending and sigmoid diverticulosis. Lymphatic: No enlarged abdominal or pelvic lymph nodes. Reproductive: No mass or other significant abnormality. Other: Fat containing bilateral inguinal hernias. Small fat containing umbilical hernia. No ascites. Musculoskeletal: No acute osseous findings. IMPRESSION: 1. Normal contour and caliber of the thoracic and abdominal aorta. Severe mixed calcific atherosclerosis of the abdominal aorta. No evidence of aneurysm, dissection, or other acute aortic pathology. 2. Coronary artery disease. 3. Diffuse bilateral bronchial wall thickening. Background of fine centrilobular nodularity, most concentrated in  the lung apices. Findings are nonspecific and infectious or inflammatory although most commonly seen in smoking-related respiratory bronchiolitis. 4. Hepatic steatosis. 5. Descending and sigmoid diverticulosis without evidence of acute diverticulitis. Aortic Atherosclerosis (ICD10-I70.0).  Electronically Signed   By: Jearld Lesch M.D.   On: 08/19/2023 16:27     Assessment and Plan:   Chest pain/Elevated troponin CAD s/p PCI/DES 08/2022 - patient presented with chest pain found to have new onset Afib RVR, severe hypertension and mildly elevated troponin in the setting of significant alcohol use and medication non-compliance - HS trop 62>70 - EKG showed Afib RVR rates 140s>now in NSR - he reported he has been without meds for 1 months - restart PTA ASA 81mg  daily, Plavix 75mg  daily, Crestor 40mg  daily, Toprol 75mg  daily - he reports mild chest pain on interview - continue IV heparin - continue to trend troponin - check an echo - low suspicion for ACS, however cannot completely rule out. Reasons for demand ischemia would be arrhythmia and elevated BP. As outpatient, plan for was repeat heart cath for persistent chest pain. Will discuss with MD  Tachyarrhythmia, New onset Afib RVR - EKG and tele show Afib with rates up to 140s - now in NSR - patient reported palpitations prior to admission - Afib in the setting of significant alcohol use - CHADSVASC at least 3 (HTN, CHF, PAD). He is on IV heparin, but would require long-term a/c with Eliquis - TSH wnl - continue BB for rate control  Syncope - seems to occur when he is drinking - afib could also be contributing - continue telemetry - check echo - h/o carotid disease>check [place] - will likely need heart monitor at d/c. H/o SVT as well  HFrEF - history of ICM with improved LVEF - most recent echo 07/2022 showed LVEF 55-60%, G2DD, mild MR - appears euvolemic - recheck echo  Polysubstance use - he reports he has been drinking 3-12 beers most days - does not appear to be in withdraw, CIWA per IM - denies recent drug use - alcohol cessation encouraged  Hypertensive Urgency - Bps 199/100 - reports he has not been on cardiac meds for 1 month - PTA Imdur 60mg  BID, irbesartan 150mg  daily, metoprolol 75mg  daily - will  given Imdur 60mg  and metoprolol 75mg  now - restart PTA irbesartan 150mg  daily    For questions or updates, please contact [facility] Please consult [email or web address] for contact info under    Signed, Banesa Tristan David Stall, PA-C  08/20/2023 8:11 AM

## 2023-08-20 NOTE — Progress Notes (Signed)
*  PRELIMINARY RESULTS* Echocardiogram 2D Echocardiogram has been performed.  Cristela Blue 08/20/2023, 2:45 PM

## 2023-08-20 NOTE — ED Notes (Signed)
Pt given a ginger ale per request.

## 2023-08-20 NOTE — Consult Note (Signed)
PHARMACY - ANTICOAGULATION CONSULT NOTE  Pharmacy Consult for Heparin Indication: chest pain/ACS  Allergies  Allergen Reactions   Oxycodone-Acetaminophen Other (See Comments)   Codeine Rash   Patient Measurements: Height: 5\' 3"  (160 cm) Weight: 80.2 kg (176 lb 14.4 oz) IBW/kg (Calculated) : 56.9 Heparin Dosing Weight: 74.3 kg   [name] Signs: Temp: 98.1 F (36.7 C) (01/31 0700) Temp Source: Oral (01/31 0700) BP: 199/100 (01/31 0702) Pulse Rate: 93 (01/31 0702)  Labs: Recent Labs    08/19/23 1518 08/19/23 1743 08/19/23 1914 08/20/23 0051 08/20/23 0701  HGB 16.0  --   --  16.2  --   HCT 45.9  --   --  46.4  --   PLT 285  --   --  278  --   APTT  --   --  149*  --   --   LABPROT 12.3  --   --   --   --   INR 0.9  --   --   --   --   HEPARINUNFRC  --   --   --  0.39 0.33  CREATININE 0.87  --   --   --   --   TROPONINIHS 62* 70*  --   --   --    Estimated Creatinine Clearance: 87.7 mL/min (by C-G formula based on SCr of 0.87 mg/dL).  Medical History: Past Medical History:  Diagnosis Date   CAD in native artery    a. 06/28/2014: STEMI, cath LM nl, mLAD 50%, mLCx 20%, OM1 small in size 80%, ramus 100% s/p PCI/DES 0%, EF 40%    Carotid artery disease (HCC)    CHF (congestive heart failure) (HCC)    Cocaine abuse (HCC)    Depression    H/O gastric ulcer    Hyperglycemia    a. A1C 5.6%   Hypertension    Ischemic cardiomyopathy    a. EF 40% by cath 06/28/2014   MI (myocardial infarction) (HCC)    Polysubstance abuse (HCC)    a. cocaine, tobacco, and alcohol    Tobacco abuse    Medications:  No PTA anticoagulation noted from chart review   Assessment: Marcus [name] is a 58 year old male that presented with chest pain and numbness to both arms. Elevated troponin of 62. From chart review, patient is not on any PTA anticoagulation. Pharmacy has been consulted for initation and management of heparin infusion. Baseline labs: Hgb 16.0, PLT 285, INR 0.9, aPTT has been  ordered.   1/31:  HL @ 0051 = 0.39, therapeutic X 1 1/31 0701 HL 0.33 Therapeutic x2  Goal of Therapy:  Heparin level 0.3-0.7 units/ml Monitor platelets by anticoagulation protocol: Yes   Plan:  1/31 0701 HL 0.33 Therapeutic x2 - Will continue heparin drip at 900 units/hr   -f/u HL with am labs Monitor CBC daily and for signs/symptoms of bleeding   Marcus [name] A, PharmD 08/20/2023 7:32 AM

## 2023-08-20 NOTE — TOC CM/SW Note (Signed)
TOC consulted for SA resources. Added resources to AVS.  Alfonso Ramus, LCSW Transitions of Care Department [phone]

## 2023-08-21 DIAGNOSIS — I2510 Atherosclerotic heart disease of native coronary artery without angina pectoris: Secondary | ICD-10-CM

## 2023-08-21 DIAGNOSIS — F1910 Other psychoactive substance abuse, uncomplicated: Secondary | ICD-10-CM

## 2023-08-21 DIAGNOSIS — I4891 Unspecified atrial fibrillation: Principal | ICD-10-CM

## 2023-08-21 DIAGNOSIS — I2489 Other forms of acute ischemic heart disease: Secondary | ICD-10-CM | POA: Diagnosis not present

## 2023-08-21 DIAGNOSIS — I10 Essential (primary) hypertension: Secondary | ICD-10-CM

## 2023-08-21 LAB — CBC
HCT: 40.4 % (ref 39.0–52.0)
Hemoglobin: 14 g/dL (ref 13.0–17.0)
MCH: 32.1 pg (ref 26.0–34.0)
MCHC: 34.7 g/dL (ref 30.0–36.0)
MCV: 92.7 fL (ref 80.0–100.0)
Platelets: 220 10*3/uL (ref 150–400)
RBC: 4.36 MIL/uL (ref 4.22–5.81)
RDW: 13.2 % (ref 11.5–15.5)
WBC: 6.9 10*3/uL (ref 4.0–10.5)
nRBC: 0 % (ref 0.0–0.2)

## 2023-08-21 LAB — HEPARIN LEVEL (UNFRACTIONATED): Heparin Unfractionated: 0.24 [IU]/mL — ABNORMAL LOW (ref 0.30–0.70)

## 2023-08-21 LAB — HIV ANTIBODY (ROUTINE TESTING W REFLEX): HIV Screen 4th Generation wRfx: NONREACTIVE

## 2023-08-21 MED ORDER — ADULT MULTIVITAMIN W/MINERALS CH: 1.0000 | ORAL_TABLET | Freq: Every day | ORAL | 0 refills | Status: AC

## 2023-08-21 MED ORDER — MOMETASONE FURO-FORMOTEROL FUM 200-5 MCG/ACT IN AERO: 2.0000 | INHALATION_SPRAY | Freq: Two times a day (BID) | RESPIRATORY_TRACT | 0 refills | Status: AC

## 2023-08-21 MED ORDER — PANTOPRAZOLE SODIUM 40 MG PO TBEC: 40.0000 mg | DELAYED_RELEASE_TABLET | Freq: Every day | ORAL | 1 refills | Status: AC

## 2023-08-21 MED ORDER — DULOXETINE HCL 30 MG PO CPEP: 30.0000 mg | ORAL_CAPSULE | Freq: Every day | ORAL | 3 refills | Status: DC

## 2023-08-21 MED ORDER — ROSUVASTATIN CALCIUM 40 MG PO TABS: 40.0000 mg | ORAL_TABLET | Freq: Every day | ORAL | 1 refills | Status: DC

## 2023-08-21 MED ORDER — APIXABAN 5 MG PO TABS
5.0000 mg | ORAL_TABLET | Freq: Two times a day (BID) | ORAL | Status: DC
  Administered 2023-08-21: 5 mg via ORAL
  Filled 2023-08-21: qty 1

## 2023-08-21 MED ORDER — ISOSORBIDE MONONITRATE ER 60 MG PO TB24: 60.0000 mg | ORAL_TABLET | Freq: Two times a day (BID) | ORAL | 1 refills | Status: DC

## 2023-08-21 MED ORDER — HEPARIN BOLUS VIA INFUSION
1100.0000 [IU] | Freq: Once | INTRAVENOUS | Status: AC
  Administered 2023-08-21: 1100 [IU] via INTRAVENOUS
  Filled 2023-08-21: qty 1100

## 2023-08-21 MED ORDER — METOPROLOL SUCCINATE ER 25 MG PO TB24: 75.0000 mg | ORAL_TABLET | Freq: Every day | ORAL | 1 refills | Status: DC

## 2023-08-21 MED ORDER — VENTOLIN HFA 108 (90 BASE) MCG/ACT IN AERS: 1.0000 | INHALATION_SPRAY | RESPIRATORY_TRACT | 1 refills | Status: AC | PRN

## 2023-08-21 MED ORDER — QUETIAPINE FUMARATE 200 MG PO TABS: 200.0000 mg | ORAL_TABLET | Freq: Every day | ORAL | 1 refills | Status: DC

## 2023-08-21 MED ORDER — IRBESARTAN 150 MG PO TABS: 150.0000 mg | ORAL_TABLET | Freq: Every day | ORAL | 2 refills | Status: DC

## 2023-08-21 MED ORDER — SPIRIVA RESPIMAT 1.25 MCG/ACT IN AERS: 2.0000 | INHALATION_SPRAY | Freq: Every day | RESPIRATORY_TRACT | 1 refills | Status: DC

## 2023-08-21 MED ORDER — APIXABAN 5 MG PO TABS: 5.0000 mg | ORAL_TABLET | Freq: Two times a day (BID) | ORAL | 1 refills | Status: DC

## 2023-08-21 NOTE — Discharge Summary (Signed)
Physician Discharge Summary   Patient: Marcus [name] MRN: [id number] DOB: 1966/02/26  Admit date:     08/19/2023  Discharge date: 08/21/23  Discharge Physician: Marcus [name]   PCP: Marcus Lapping, NP   Recommendations at discharge:   follow-up with PCP in 1 to 2 weeks follow-up Marcus [name] [name] cardiology cadence furth PA in 1 to 2 weeks patient advised to abstain from drinking alcohol and smoking cigarettes   Discharge Diagnoses: Principal Problem:   Chest pain Active Problems:   Hypertension   CAD in native artery   Ischemic cardiomyopathy   Polysubstance abuse (HCC)   Cocaine abuse (HCC)   Carotid stenosis   HFrEF (heart failure with reduced ejection fraction) (HCC)  Marcus [name] is a 58 y.o. male with medical history significant for cad s/p stent x2, polysubstance abuse (etoh, cocaine), htn, hfpef, carotid stenosis s/p stent, presenting with chest pain. Patient reports he has ran out of his cardiac meds/home meds for past one month he has history of alcohol abuse and smoking  No family at bedside today   a fib with RVR new onset -- patient was started on IV heparin drip given his risk factors. His switch to oral eliquis. Per cardiology discontinue aspirin and Plavix -- Currently in sinus rhythm on metoprolol. -- Seen by Marcus [name] from Marcus [name] okay to discharge from cardiology standpoint -- Echo reviewed EF of 55 to 60% -- TSH stable  Chest pain in the setting of CAD with elevated troponin likely demand ischemia from a fib -- no acute EKG changes for ischemia -- resume statins, metoprolol, ARB, imdur -- patient chest pain free today. -- PRN nitroglycerin  Alcohol use history of smoking -- patient was placed on ciwa-- currently scoring low. Recommend cessation -- continue nicotine patch  History of depression -- resumed duloxetine and Seroquel  COPD -- resume home inhalers  Overall hemodynamically stable. Okay to discharge from cardiology standpoint. Patient  is agreeable        Consultants: Marcus [name] cardiology Procedures performed: none Disposition: Home Diet recommendation:  Discharge Diet Orders (From admission, onward)     Start     Ordered   08/21/23 0000  Diet - low sodium heart healthy        08/21/23 1300           Cardiac diet DISCHARGE MEDICATION: Allergies as of 08/21/2023       Reactions   Oxycodone-acetaminophen Other (See Comments)   Codeine Rash        Medication List     STOP taking these medications    aspirin 81 [name] tablet   clopidogrel 75 [name] tablet Commonly known as: PLAVIX   fluticasone-salmeterol 250-50 MCG/ACT Aepb Commonly known as: Advair Diskus Replaced by: mometasone-formoterol 200-5 MCG/ACT Aero       TAKE these medications    apixaban 5 [name] Tabs tablet Commonly known as: ELIQUIS Take 1 tablet (5 [name] total) by mouth 2 (two) times daily.   DULoxetine 30 [name] capsule Commonly known as: CYMBALTA Take 1 capsule (30 [name] total) by mouth daily. Start taking on: August 22, 2023 What changed: Another medication with the same name was removed. Continue taking this medication, and follow the directions you see here.   hydrOXYzine 25 [name] capsule Commonly known as: VISTARIL Take 25 [name] by mouth 2 (two) times daily.   irbesartan 150 [name] tablet Commonly known as: AVAPRO Take 1 tablet (150 [name] total) by mouth daily.   isosorbide mononitrate 60 [name] 24  hr tablet Commonly known as: IMDUR Take 1 tablet (60 [name] total) by mouth 2 (two) times daily. What changed: when to take this   metoprolol succinate 25 [name] 24 hr tablet Commonly known as: TOPROL-XL Take 3 tablets (75 [name] total) by mouth daily. Take with or immediately following a meal. Start taking on: August 22, 2023 What changed: medication strength   mometasone-formoterol 200-5 MCG/ACT Aero Commonly known as: DULERA Inhale 2 puffs into the lungs 2 (two) times daily. Replaces: fluticasone-salmeterol 250-50 MCG/ACT Aepb   multivitamin with  minerals Tabs tablet Take 1 tablet by mouth daily. Start taking on: August 22, 2023   nicotine 21 [name]/24hr patch Commonly known as: NICODERM CQ - dosed in [name]/24 hours Place 21 [name] onto the skin daily as needed.   nitroGLYCERIN 0.4 [name] SL tablet Commonly known as: NITROSTAT Place 1 tablet (0.4 [name] total) under the tongue every 5 (five) minutes as needed for chest pain.   pantoprazole 40 [name] tablet Commonly known as: PROTONIX Take 1 tablet (40 [name] total) by mouth daily.   QUEtiapine 200 [name] tablet Commonly known as: SEROQUEL Take 1 tablet (200 [name] total) by mouth at bedtime.   rosuvastatin 40 [name] tablet Commonly known as: CRESTOR Take 1 tablet (40 [name] total) by mouth daily.   Spiriva Respimat 1.25 MCG/ACT Aers Generic drug: Tiotropium Bromide Monohydrate Inhale 2 sprays into the lungs daily.   Ventolin HFA 108 (90 Base) MCG/ACT inhaler Generic drug: albuterol Inhale 1-2 puffs into the lungs every 4 (four) hours as needed for shortness of breath or wheezing. SMARTSIG:2 Puff(s) By Mouth Every 4 Hours PRN        Follow-up Information     Marcus Lapping, NP. Schedule an appointment as soon as possible for a visit in 1 week(s).   Specialty: Nurse Practitioner Contact information: [address] [place] [address] [phone]         Marcus [name], Cadence H, PA-C. Schedule an appointment as soon as possible for a visit in 1 week(s).   Specialty: Cardiology Why: A fib with RVR history of CAD Contact information: [address] [place] [address] [phone]                Discharge Exam: Marcus [name] Weights   08/19/23 1515 08/19/23 1554  Weight: 81.6 kg 80.2 kg   Alert and oriented times three respiratory clear to auscultation cardiovascular mild tachycardia no murmur neuro- alert and oriented times three  Condition at discharge: fair  The results of significant diagnostics from this hospitalization (including imaging, microbiology, ancillary and  laboratory) are listed below for reference.   Imaging Studies: ECHOCARDIOGRAM COMPLETE Result Date: 08/20/2023    ECHOCARDIOGRAM REPORT   Patient Name:   Marcus [name] Date of Exam: 08/20/2023 Medical Rec #:  [id number]        Height:       63.0 in Accession #:    [id number]       Weight:       176.9 lb Date of Birth:  01-04-1966        BSA:          1.835 m Patient Age:    57 years         BP:           185/98 mmHg Patient Gender: M                HR:           94  bpm. Exam Location:  [facility] Procedure: 2D Echo, Cardiac Doppler and Color Doppler Indications:     Chest pain R07.9  History:         Patient has prior history of Echocardiogram examinations, most                  recent 08/17/2022. CHF, Previous Myocardial Infarction; Risk                  Factors:Hypertension.  Sonographer:     Cristela Blue Referring Phys:  [id number] [facility] WOUK Diagnosing Phys: Debbe Odea MD IMPRESSIONS  1. Left ventricular ejection fraction, by estimation, is 55 to 60%. The left ventricle has normal function. The left ventricle has no regional wall motion abnormalities. There is mild left ventricular hypertrophy. Left ventricular diastolic parameters are consistent with Grade I diastolic dysfunction (impaired relaxation).  2. Right ventricular systolic function is normal. The right ventricular size is normal.  3. The mitral valve is normal in structure. Mild mitral valve regurgitation.  4. The aortic valve was not well visualized. Aortic valve regurgitation is not visualized.  5. The inferior vena cava is normal in size with greater than 50% respiratory variability, suggesting right atrial pressure of 3 mmHg. FINDINGS  Left Ventricle: Left ventricular ejection fraction, by estimation, is 55 to 60%. The left ventricle has normal function. The left ventricle has no regional wall motion abnormalities. The left ventricular internal cavity size was normal in size. There is  mild left ventricular hypertrophy. Left ventricular  diastolic parameters are consistent with Grade I diastolic dysfunction (impaired relaxation). Right Ventricle: The right ventricular size is normal. No increase in right ventricular wall thickness. Right ventricular systolic function is normal. Left Atrium: Left atrial size was normal in size. Right Atrium: Right atrial size was normal in size. Pericardium: There is no evidence of pericardial effusion. Mitral Valve: The mitral valve is normal in structure. Mild mitral valve regurgitation. Tricuspid Valve: The tricuspid valve is normal in structure. Tricuspid valve regurgitation is not demonstrated. Aortic Valve: The aortic valve was not well visualized. Aortic valve regurgitation is not visualized. Aortic valve mean gradient measures 2.0 mmHg. Aortic valve peak gradient measures 3.9 mmHg. Aortic valve area, by VTI measures 2.65 cm. Pulmonic Valve: The pulmonic valve was normal in structure. Pulmonic valve regurgitation is not visualized. Aorta: The aortic root is normal in size and structure. Venous: The inferior vena cava is normal in size with greater than 50% respiratory variability, suggesting right atrial pressure of 3 mmHg. IAS/Shunts: No atrial level shunt detected by color flow Doppler.  LEFT VENTRICLE PLAX 2D LVIDd:         3.20 cm   Diastology LVIDs:         2.20 cm   LV e' medial:    7.72 cm/s LV PW:         1.00 cm   LV E/e' medial:  10.5 LV IVS:        1.20 cm   LV e' lateral:   7.51 cm/s LVOT diam:     2.00 cm   LV E/e' lateral: 10.8 LV SV:         29 LV SV Index:   16 LVOT Area:     3.14 cm  RIGHT VENTRICLE RV Basal diam:  2.30 cm RV Mid diam:    1.60 cm LEFT ATRIUM             Index        RIGHT  ATRIUM          Index LA diam:        3.20 cm 1.74 cm/m   RA Area:     6.84 cm LA Vol (A2C):   15.7 ml 8.55 ml/m   RA Volume:   9.76 ml  5.32 ml/m LA Vol (A4C):   26.5 ml 14.44 ml/m LA Biplane Vol: 21.2 ml 11.55 ml/m  AORTIC VALVE AV Area (Vmax):    2.37 cm AV Area (Vmean):   2.28 cm AV Area (VTI):      2.65 cm AV Vmax:           98.30 cm/s AV Vmean:          68.800 cm/s AV VTI:            0.111 m AV Peak Grad:      3.9 mmHg AV Mean Grad:      2.0 mmHg LVOT Vmax:         74.10 cm/s LVOT Vmean:        50.000 cm/s LVOT VTI:          0.094 m LVOT/AV VTI ratio: 0.84  AORTA Ao Root diam: 2.30 cm MITRAL VALVE                TRICUSPID VALVE MV Area (PHT): 7.16 cm     TR Peak grad:   8.9 mmHg MV Decel Time: 106 msec     TR Vmax:        149.00 cm/s MV E velocity: 81.00 cm/s MV A velocity: 137.00 cm/s  SHUNTS MV E/A ratio:  0.59         Systemic VTI:  0.09 m                             Systemic Diam: 2.00 cm Debbe Odea MD Electronically signed by Debbe Odea MD Signature Date/Time: 08/20/2023/4:26:01 PM    Final    US Carotid Bilateral Result Date: 08/20/2023 CLINICAL DATA:  58 year old male with history of syncope and collapse. EXAM: BILATERAL CAROTID DUPLEX ULTRASOUND TECHNIQUE: Wallace Cullens scale imaging, color Doppler and duplex ultrasound were performed of bilateral carotid and vertebral arteries in the neck. COMPARISON:  None Available. FINDINGS: Criteria: Quantification of carotid stenosis is based on velocity parameters that correlate the residual internal carotid diameter with NASCET-based stenosis levels, using the diameter of the distal internal carotid lumen as the denominator for stenosis measurement. The following velocity measurements were obtained: RIGHT ICA: Peak systolic velocity 99 cm/sec, End diastolic velocity 24 cm/sec CCA: Peak systolic velocity 64 cm/sec SYSTOLIC ICA/CCA RATIO:  1.5 ECA: Peak systolic velocity 220 cm/sec LEFT ICA: Peak systolic velocity 249 cm/sec, End diastolic velocity 57 cm/sec CCA: 62 cm/sec SYSTOLIC ICA/CCA RATIO:  4.0 ECA: 220 cm/sec RIGHT CAROTID ARTERY: Mild scattered multifocal atherosclerotic plaque formation. No significant tortuosity. Normal low resistance waveforms. RIGHT VERTEBRAL ARTERY:  Antegrade flow. LEFT CAROTID ARTERY: Mild scattered multifocal  atherosclerotic plaque formation in the carotid bulb and bifurcation. Severe focal fibrofatty and calcific atherosclerotic calcification in the mid internal carotid artery. No significant tortuosity. Normal low resistance waveforms. LEFT VERTEBRAL ARTERY:  Antegrade flow. Upper extremity non-invasive blood pressures: Not obtained. IMPRESSION: 1. Right carotid artery system: Less than 50% stenosis secondary to atherosclerotic plaque formation. 2. Left carotid artery system: Greater than 69% but less than near occlusion secondary to focal atherosclerotic plaque formation in the mid internal carotid artery. 3.  Vertebral artery  system: Patent with antegrade flow bilaterally. Marliss Coots, MD [facility] Radiology Electronically Signed   By: Marliss Coots M.D.   On: 08/20/2023 12:01   CT Angio Chest/Abd/Pel for Dissection W and/or Wo Contrast Result Date: 08/19/2023 CLINICAL DATA:  Chest pain, arm numbness, history of coronary stenting EXAM: CT ANGIOGRAPHY CHEST, ABDOMEN AND PELVIS TECHNIQUE: Non-contrast CT of the chest was initially obtained. Multidetector CT imaging through the chest, abdomen and pelvis was performed using the standard protocol during bolus administration of intravenous contrast. Multiplanar reconstructed images and MIPs were obtained and reviewed to evaluate the vascular anatomy. RADIATION DOSE REDUCTION: This exam was performed according to the departmental dose-optimization program which includes automated exposure control, adjustment of the mA and/or kV according to patient size and/or use of iterative reconstruction technique. CONTRAST:  OMNIPAQUE IOHEXOL 350 [name]/ML SOLN COMPARISON:  CT angiogram chest abdomen pelvis, 06/28/2014 FINDINGS: CTA CHEST FINDINGS VASCULAR Aorta: Satisfactory opacification of the aorta. Normal contour and caliber of the thoracic aorta. Incidental note of bovine type two-vessel branching pattern of the aortic  arch. No evidence of aneurysm, dissection, or other acute aortic pathology. Moderate mixed calcific atherosclerosis. Cardiovascular: No evidence of pulmonary embolism on limited non-tailored examination. Normal heart size. Three-vessel coronary artery calcifications and stents. No pericardial effusion. Review of the MIP images confirms the above findings. NON VASCULAR Mediastinum/Nodes: No enlarged mediastinal, hilar, or axillary lymph nodes. Thyroid gland, trachea, and esophagus demonstrate no significant findings. Lungs/Pleura: Examination of the lungs is limited by breath motion artifact. Diffuse bilateral bronchial wall thickening. Background of fine centrilobular nodularity, most concentrated in the lung apices. No pleural effusion or pneumothorax. Musculoskeletal: No chest wall abnormality. No acute osseous findings. Review of the MIP images confirms the above findings. CTA ABDOMEN AND PELVIS FINDINGS VASCULAR Normal contour and caliber of the abdominal aorta. Severe mixed calcific atherosclerosis. No evidence of aneurysm, dissection, or other acute aortic pathology. Standard branching pattern of the abdominal aorta with solitary bilateral renal arteries. Review of the MIP images confirms the above findings. NON-VASCULAR Hepatobiliary: No solid liver abnormality is seen. Marked hepatic steatosis. No gallstones, gallbladder wall thickening, or biliary dilatation. Pancreas: Unremarkable. No pancreatic ductal dilatation or surrounding inflammatory changes. Spleen: Normal in size without significant abnormality. Adrenals/Urinary Tract: Adrenal glands are unremarkable. Kidneys are normal, without renal calculi, solid lesion, or hydronephrosis. Mildly distended urinary bladder. Stomach/Bowel: Stomach is within normal limits. Appendix appears normal. No evidence of bowel wall thickening, distention, or inflammatory changes. Descending and sigmoid diverticulosis. Lymphatic: No enlarged abdominal or pelvic lymph nodes.  Reproductive: No mass or other significant abnormality. Other: Fat containing bilateral inguinal hernias. Small fat containing umbilical hernia. No ascites. Musculoskeletal: No acute osseous findings. IMPRESSION: 1. Normal contour and caliber of the thoracic and abdominal aorta. Severe mixed calcific atherosclerosis of the abdominal aorta. No evidence of aneurysm, dissection, or other acute aortic pathology. 2. Coronary artery disease. 3. Diffuse bilateral bronchial wall thickening. Background of fine centrilobular nodularity, most concentrated in the lung apices. Findings are nonspecific and infectious or inflammatory although most commonly seen in smoking-related respiratory bronchiolitis. 4. Hepatic steatosis. 5. Descending and sigmoid diverticulosis without evidence of acute diverticulitis. Aortic Atherosclerosis (ICD10-I70.0). Electronically Signed   By: Jearld [name] M.D.   On: 08/19/2023 16:27    Microbiology: Results for orders placed or performed during the hospital encounter of 08/19/23  Resp panel by RT-PCR (RSV, Flu A&B, Covid) Anterior Nasal Swab     Status: None   Collection Time: 08/20/23  9:51 AM   Specimen: Anterior Nasal Swab  Result Value Ref Range Status   SARS Coronavirus 2 by RT PCR NEGATIVE NEGATIVE Final    Comment: (NOTE) SARS-CoV-2 target nucleic acids are NOT DETECTED.  The SARS-CoV-2 RNA is generally detectable in upper respiratory specimens during the acute phase of infection. The lowest concentration of SARS-CoV-2 viral copies this assay can detect is 138 copies/mL. A negative result does not preclude SARS-Cov-2 infection and should not be used as the sole basis for treatment or other patient management decisions. A negative result may occur with  improper specimen collection/handling, submission of specimen other than nasopharyngeal swab, presence of viral mutation(s) within the areas targeted by this assay, and inadequate number of viral copies(<138 copies/mL). A  negative result must be combined with clinical observations, patient history, and epidemiological information. The expected result is Negative.  Fact Sheet for Patients:  BloggerCourse.com  Fact Sheet for Healthcare Providers:  SeriousBroker.it  This test is no t yet approved or cleared by the [place] FDA and  has been authorized for detection and/or diagnosis of SARS-CoV-2 by FDA under an Emergency Use Authorization (EUA). This EUA will remain  in effect (meaning this test can be used) for the duration of the COVID-19 declaration under Section 564(b)(1) of the Act, 21 U.S.C.section 360bbb-3(b)(1), unless the authorization is terminated  or revoked sooner.       Influenza A by PCR NEGATIVE NEGATIVE Final   Influenza B by PCR NEGATIVE NEGATIVE Final    Comment: (NOTE) The Xpert Xpress SARS-CoV-2/FLU/RSV plus assay is intended as an aid in the diagnosis of influenza from Nasopharyngeal swab specimens and should not be used as a sole basis for treatment. Nasal washings and aspirates are unacceptable for Xpert Xpress SARS-CoV-2/FLU/RSV testing.  Fact Sheet for Patients: BloggerCourse.com  Fact Sheet for Healthcare Providers: SeriousBroker.it  This test is not yet approved or cleared by the [place] FDA and has been authorized for detection and/or diagnosis of SARS-CoV-2 by FDA under an Emergency Use Authorization (EUA). This EUA will remain in effect (meaning this test can be used) for the duration of the COVID-19 declaration under Section 564(b)(1) of the Act, 21 U.S.C. section 360bbb-3(b)(1), unless the authorization is terminated or revoked.     Resp Syncytial Virus by PCR NEGATIVE NEGATIVE Final    Comment: (NOTE) Fact Sheet for Patients: BloggerCourse.com  Fact Sheet for Healthcare  Providers: SeriousBroker.it  This test is not yet approved or cleared by the [place] FDA and has been authorized for detection and/or diagnosis of SARS-CoV-2 by FDA under an Emergency Use Authorization (EUA). This EUA will remain in effect (meaning this test can be used) for the duration of the COVID-19 declaration under Section 564(b)(1) of the Act, 21 U.S.C. section 360bbb-3(b)(1), unless the authorization is terminated or revoked.  Performed at [name], [address] [place], [place] [address]     Labs: CBC: Recent Labs  Lab 08/19/23 1518 08/20/23 0051 08/20/23 2202 08/21/23 0617  WBC 9.0 8.2  --  6.9  HGB 16.0 16.2 15.4 14.0  HCT 45.9 46.4 44.7 40.4  MCV 92.2 91.5  --  92.7  PLT 285 278  --  220   Basic Metabolic Panel: Recent Labs  Lab 08/19/23 1518 08/20/23 0951  NA 140  --   K 3.8  --   CL 98  --   CO2 23  --   GLUCOSE 107*  --   BUN 14  --   CREATININE  0.87  --   CALCIUM 9.3  --   [name]  --  1.9   Liver Function Tests: No results for input(s): "AST", "ALT", "ALKPHOS", "BILITOT", "PROT", "ALBUMIN" in the last 168 hours. CBG: Recent Labs  Lab 08/20/23 2223  GLUCAP 114*    Discharge time spent: greater than 30 minutes.  Signed: Enedina Finner, MD [facility] 08/21/2023

## 2023-08-21 NOTE — ED Notes (Signed)
Manuela Schwartz, NP was informed pt IV was disconnected, blood was on the sheet and floor, and pt became diaphoretic after helping him back in bed.

## 2023-08-21 NOTE — Progress Notes (Signed)
Rounding Note    Patient Name: Marcus [name] Date of Encounter: 08/21/2023  [facility] Cardiologist: Julien Nordmann, MD  Subjective   BP improving after restarting PTA meds. HS trop trending down 70>34. Remains in NSR. He denies chest pain.   Inpatient Medications    Scheduled Meds:  aspirin EC  81 mg Oral Daily   clopidogrel  75 mg Oral Daily   DULoxetine  30 mg Oral Daily   folic acid  1 mg Oral Daily   irbesartan  150 mg Oral Daily   isosorbide mononitrate  60 mg Oral BID   metoprolol succinate  75 mg Oral Daily   mometasone-formoterol  2 puff Inhalation BID   multivitamin with minerals  1 tablet Oral Daily   nicotine  21 mg Transdermal Daily   QUEtiapine  200 mg Oral QHS   rosuvastatin  40 mg Oral Daily   thiamine  100 mg Oral Daily   Or   thiamine  100 mg Intravenous Daily   Continuous Infusions:  heparin 1,050 Units/hr (08/21/23 0733)   PRN Meds: acetaminophen, LORazepam **OR** LORazepam, morphine injection, nitroGLYCERIN, ondansetron (ZOFRAN) IV   Vital Signs    Vitals:   08/21/23 0200 08/21/23 0305 08/21/23 0310 08/21/23 0740  BP: (!) 92/59 94/61  127/80  Pulse: 73 81  85  Resp:   20 16  Temp:    98.2 F (36.8 C)  TempSrc:    Oral  SpO2: 93% 96%  98%  Weight:      Height:       No intake or output data in the 24 hours ending 08/21/23 0935    08/19/2023    3:54 PM 08/19/2023    3:15 PM 04/21/2023    1:24 PM  Last 3 Weights  Weight (lbs) 176 lb 14.4 oz 180 lb 175 lb 9.6 oz  Weight (kg) 80.241 kg 81.647 kg 79.652 kg      Telemetry    NSR HR 80-100s - Personally Reviewed  ECG    No new - Personally Reviewed  Physical Exam   GEN: No acute distress.   Neck: No JVD Cardiac: RRR, no murmurs, rubs, or gallops.  Respiratory: Clear to auscultation bilaterally. GI: Soft, nontender, non-distended  MS: No edema; No deformity. Neuro:  Nonfocal  Psych: Normal affect   Labs    High Sensitivity Troponin:   Recent Labs  Lab  08/19/23 1518 08/19/23 1743 08/20/23 0950  TROPONINIHS 62* 70* 34*     Chemistry Recent Labs  Lab 08/19/23 1518 08/20/23 0951  NA 140  --   K 3.8  --   CL 98  --   CO2 23  --   GLUCOSE 107*  --   BUN 14  --   CREATININE 0.87  --   CALCIUM 9.3  --   MG  --  1.9  GFRNONAA >60  --   ANIONGAP 19*  --     Lipids  Recent Labs  Lab 08/20/23 1730  CHOL 189  TRIG 141  HDL 83  LDLCALC 78  CHOLHDL 2.3    Hematology Recent Labs  Lab 08/19/23 1518 08/20/23 0051 08/20/23 2202 08/21/23 0617  WBC 9.0 8.2  --  6.9  RBC 4.98 5.07  --  4.36  HGB 16.0 16.2 15.4 14.0  HCT 45.9 46.4 44.7 40.4  MCV 92.2 91.5  --  92.7  MCH 32.1 32.0  --  32.1  MCHC 34.9 34.9  --  34.7  RDW  13.7 13.5  --  13.2  PLT 285 278  --  220   Thyroid  Recent Labs  Lab 08/19/23 1914  TSH 1.216  FREET4 0.93    BNP Recent Labs  Lab 08/19/23 1518  BNP 47.6    DDimer  Recent Labs  Lab 08/19/23 1914  DDIMER 1.07*     Radiology    ECHOCARDIOGRAM COMPLETE Result Date: 08/20/2023    ECHOCARDIOGRAM REPORT   Patient Name:   WATT GEILER Date of Exam: 08/20/2023 Medical Rec #:  [id number]        Height:       63.0 in Accession #:    [id number]       Weight:       176.9 lb Date of Birth:  May 29, 1966        BSA:          1.835 m Patient Age:    57 years         BP:           185/98 mmHg Patient Gender: M                HR:           94 bpm. Exam Location:  [facility] Procedure: 2D Echo, Cardiac Doppler and Color Doppler Indications:     Chest pain R07.9  History:         Patient has prior history of Echocardiogram examinations, most                  recent 08/17/2022. CHF, Previous Myocardial Infarction; Risk                  Factors:Hypertension.  Sonographer:     Cristela Blue Referring Phys:  [id number] [facility] Diagnosing Phys: Debbe Odea MD IMPRESSIONS  1. Left ventricular ejection fraction, by estimation, is 55 to 60%. The left ventricle has normal function. The left ventricle has no regional wall  motion abnormalities. There is mild left ventricular hypertrophy. Left ventricular diastolic parameters are consistent with Grade I diastolic dysfunction (impaired relaxation).  2. Right ventricular systolic function is normal. The right ventricular size is normal.  3. The mitral valve is normal in structure. Mild mitral valve regurgitation.  4. The aortic valve was not well visualized. Aortic valve regurgitation is not visualized.  5. The inferior vena cava is normal in size with greater than 50% respiratory variability, suggesting right atrial pressure of 3 mmHg. FINDINGS  Left Ventricle: Left ventricular ejection fraction, by estimation, is 55 to 60%. The left ventricle has normal function. The left ventricle has no regional wall motion abnormalities. The left ventricular internal cavity size was normal in size. There is  mild left ventricular hypertrophy. Left ventricular diastolic parameters are consistent with Grade I diastolic dysfunction (impaired relaxation). Right Ventricle: The right ventricular size is normal. No increase in right ventricular wall thickness. Right ventricular systolic function is normal. Left Atrium: Left atrial size was normal in size. Right Atrium: Right atrial size was normal in size. Pericardium: There is no evidence of pericardial effusion. Mitral Valve: The mitral valve is normal in structure. Mild mitral valve regurgitation. Tricuspid Valve: The tricuspid valve is normal in structure. Tricuspid valve regurgitation is not demonstrated. Aortic Valve: The aortic valve was not well visualized. Aortic valve regurgitation is not visualized. Aortic valve mean gradient measures 2.0 mmHg. Aortic valve peak gradient measures 3.9 mmHg. Aortic valve area, by VTI measures 2.65 cm. Pulmonic Valve: The  pulmonic valve was normal in structure. Pulmonic valve regurgitation is not visualized. Aorta: The aortic root is normal in size and structure. Venous: The inferior vena cava is normal in size  with greater than 50% respiratory variability, suggesting right atrial pressure of 3 mmHg. IAS/Shunts: No atrial level shunt detected by color flow Doppler.  LEFT VENTRICLE PLAX 2D LVIDd:         3.20 cm   Diastology LVIDs:         2.20 cm   LV e' medial:    7.72 cm/s LV PW:         1.00 cm   LV E/e' medial:  10.5 LV IVS:        1.20 cm   LV e' lateral:   7.51 cm/s LVOT diam:     2.00 cm   LV E/e' lateral: 10.8 LV SV:         29 LV SV Index:   16 LVOT Area:     3.14 cm  RIGHT VENTRICLE RV Basal diam:  2.30 cm RV Mid diam:    1.60 cm LEFT ATRIUM             Index        RIGHT ATRIUM          Index LA diam:        3.20 cm 1.74 cm/m   RA Area:     6.84 cm LA Vol (A2C):   15.7 ml 8.55 ml/m   RA Volume:   9.76 ml  5.32 ml/m LA Vol (A4C):   26.5 ml 14.44 ml/m LA Biplane Vol: 21.2 ml 11.55 ml/m  AORTIC VALVE AV Area (Vmax):    2.37 cm AV Area (Vmean):   2.28 cm AV Area (VTI):     2.65 cm AV Vmax:           98.30 cm/s AV Vmean:          68.800 cm/s AV VTI:            0.111 m AV Peak Grad:      3.9 mmHg AV Mean Grad:      2.0 mmHg LVOT Vmax:         74.10 cm/s LVOT Vmean:        50.000 cm/s LVOT VTI:          0.094 m LVOT/AV VTI ratio: 0.84  AORTA Ao Root diam: 2.30 cm MITRAL VALVE                TRICUSPID VALVE MV Area (PHT): 7.16 cm     TR Peak grad:   8.9 mmHg MV Decel Time: 106 msec     TR Vmax:        149.00 cm/s MV E velocity: 81.00 cm/s MV A velocity: 137.00 cm/s  SHUNTS MV E/A ratio:  0.59         Systemic VTI:  0.09 m                             Systemic Diam: 2.00 cm Debbe Odea MD Electronically signed by Debbe Odea MD Signature Date/Time: 08/20/2023/4:26:01 PM    Final    US Carotid Bilateral Result Date: 08/20/2023 CLINICAL DATA:  58 year old male with history of syncope and collapse. EXAM: BILATERAL CAROTID DUPLEX ULTRASOUND TECHNIQUE: Wallace Cullens scale imaging, color Doppler and duplex ultrasound were performed of bilateral carotid and vertebral arteries in the neck. COMPARISON:  None  Available. FINDINGS: Criteria: Quantification of carotid stenosis is based on velocity parameters that correlate the residual internal carotid diameter with NASCET-based stenosis levels, using the diameter of the distal internal carotid lumen as the denominator for stenosis measurement. The following velocity measurements were obtained: RIGHT ICA: Peak systolic velocity 99 cm/sec, End diastolic velocity 24 cm/sec CCA: Peak systolic velocity 64 cm/sec SYSTOLIC ICA/CCA RATIO:  1.5 ECA: Peak systolic velocity 220 cm/sec LEFT ICA: Peak systolic velocity 249 cm/sec, End diastolic velocity 57 cm/sec CCA: 62 cm/sec SYSTOLIC ICA/CCA RATIO:  4.0 ECA: 220 cm/sec RIGHT CAROTID ARTERY: Mild scattered multifocal atherosclerotic plaque formation. No significant tortuosity. Normal low resistance waveforms. RIGHT VERTEBRAL ARTERY:  Antegrade flow. LEFT CAROTID ARTERY: Mild scattered multifocal atherosclerotic plaque formation in the carotid bulb and bifurcation. Severe focal fibrofatty and calcific atherosclerotic calcification in the mid internal carotid artery. No significant tortuosity. Normal low resistance waveforms. LEFT VERTEBRAL ARTERY:  Antegrade flow. Upper extremity non-invasive blood pressures: Not obtained. IMPRESSION: 1. Right carotid artery system: Less than 50% stenosis secondary to atherosclerotic plaque formation. 2. Left carotid artery system: Greater than 69% but less than near occlusion secondary to focal atherosclerotic plaque formation in the mid internal carotid artery. 3.  Vertebral artery system: Patent with antegrade flow bilaterally. Marliss Coots, MD [facility] Radiology Electronically Signed   By: Marliss Coots M.D.   On: 08/20/2023 12:01   CT Angio Chest/Abd/Pel for Dissection W and/or Wo Contrast Result Date: 08/19/2023 CLINICAL DATA:  Chest pain, arm numbness, history of coronary stenting EXAM: CT ANGIOGRAPHY CHEST, ABDOMEN AND PELVIS TECHNIQUE:  Non-contrast CT of the chest was initially obtained. Multidetector CT imaging through the chest, abdomen and pelvis was performed using the standard protocol during bolus administration of intravenous contrast. Multiplanar reconstructed images and MIPs were obtained and reviewed to evaluate the vascular anatomy. RADIATION DOSE REDUCTION: This exam was performed according to the departmental dose-optimization program which includes automated exposure control, adjustment of the mA and/or kV according to patient size and/or use of iterative reconstruction technique. CONTRAST:  OMNIPAQUE IOHEXOL 350 MG/ML SOLN COMPARISON:  CT angiogram chest abdomen pelvis, 06/28/2014 FINDINGS: CTA CHEST FINDINGS VASCULAR Aorta: Satisfactory opacification of the aorta. Normal contour and caliber of the thoracic aorta. Incidental note of bovine type two-vessel branching pattern of the aortic arch. No evidence of aneurysm, dissection, or other acute aortic pathology. Moderate mixed calcific atherosclerosis. Cardiovascular: No evidence of pulmonary embolism on limited non-tailored examination. Normal heart size. Three-vessel coronary artery calcifications and stents. No pericardial effusion. Review of the MIP images confirms the above findings. NON VASCULAR Mediastinum/Nodes: No enlarged mediastinal, hilar, or axillary lymph nodes. Thyroid gland, trachea, and esophagus demonstrate no significant findings. Lungs/Pleura: Examination of the lungs is limited by breath motion artifact. Diffuse bilateral bronchial wall thickening. Background of fine centrilobular nodularity, most concentrated in the lung apices. No pleural effusion or pneumothorax. Musculoskeletal: No chest wall abnormality. No acute osseous findings. Review of the MIP images confirms the above findings. CTA ABDOMEN AND PELVIS FINDINGS VASCULAR Normal contour and caliber of the abdominal aorta. Severe mixed calcific atherosclerosis. No evidence of aneurysm, dissection, or  other acute aortic pathology. Standard branching pattern of the abdominal aorta with solitary bilateral renal arteries. Review of the MIP images confirms the above findings. NON-VASCULAR Hepatobiliary: No solid liver abnormality is seen. Marked hepatic steatosis. No gallstones, gallbladder wall thickening, or biliary dilatation. Pancreas: Unremarkable. No pancreatic ductal dilatation or surrounding inflammatory changes. Spleen: Normal in size without significant  abnormality. Adrenals/Urinary Tract: Adrenal glands are unremarkable. Kidneys are normal, without renal calculi, solid lesion, or hydronephrosis. Mildly distended urinary bladder. Stomach/Bowel: Stomach is within normal limits. Appendix appears normal. No evidence of bowel wall thickening, distention, or inflammatory changes. Descending and sigmoid diverticulosis. Lymphatic: No enlarged abdominal or pelvic lymph nodes. Reproductive: No mass or other significant abnormality. Other: Fat containing bilateral inguinal hernias. Small fat containing umbilical hernia. No ascites. Musculoskeletal: No acute osseous findings. IMPRESSION: 1. Normal contour and caliber of the thoracic and abdominal aorta. Severe mixed calcific atherosclerosis of the abdominal aorta. No evidence of aneurysm, dissection, or other acute aortic pathology. 2. Coronary artery disease. 3. Diffuse bilateral bronchial wall thickening. Background of fine centrilobular nodularity, most concentrated in the lung apices. Findings are nonspecific and infectious or inflammatory although most commonly seen in smoking-related respiratory bronchiolitis. 4. Hepatic steatosis. 5. Descending and sigmoid diverticulosis without evidence of acute diverticulitis. Aortic Atherosclerosis (ICD10-I70.0). Electronically Signed   By: Jearld Lesch M.D.   On: 08/19/2023 16:27    Cardiac Studies   Echo 08/20/23 1. Left ventricular ejection fraction, by estimation, is 55 to 60%. The  left ventricle has normal  function. The left ventricle has no regional  wall motion abnormalities. There is mild left ventricular hypertrophy.  Left ventricular diastolic parameters  are consistent with Grade I diastolic dysfunction (impaired relaxation).   2. Right ventricular systolic function is normal. The right ventricular  size is normal.   3. The mitral valve is normal in structure. Mild mitral valve  regurgitation.   4. The aortic valve was not well visualized. Aortic valve regurgitation  is not visualized.   5. The inferior vena cava is normal in size with greater than 50%  respiratory variability, suggesting right atrial pressure of 3 mmHg.   Heart monitor 11/2022 Event monitor Patch Wear Time:  13 days and 13 hours (2024-04-06T18:22:38-0400 to 2024-04-20T08:13:44-0400)   Normal sinus rhythm Patient had a min HR of 45 bpm, max HR of 169 bpm, and avg HR of 76 bpm.    1 run of Ventricular Tachycardia occurred lasting 5 beats with a max rate of 169 bpm (avg 156 bpm).    2 Supraventricular Tachycardia runs occurred, the run with the fastest interval lasting 5 beats with a max rate of 117 bpm (avg 105 bpm); the run with the fastest interval was also the longest.    Isolated SVEs were rare (<1.0%), SVE Couplets were rare (<1.0%), and no SVE Triplets were present.    Isolated VEs were rare (<1.0%, 230), VE Couplets were rare (<1.0%, 4), and VE Triplets were rare (<1.0%, 1).    Patient triggered events (19) associated with normal sinus rhythm    Cardiac cath 07/2022 Conclusions: Severe single vessel coronary artery disease with 50-60% in-stent restenosis of ramus stent and 90% de-novo lesion just distal to the stent. Mild-moderate, noncritical disease involving the LAD, LCx, and RCA.  There is a 70% stenosis in the apical LAD, which is too small/distal for percutaneous intervention Mildly elevated left ventricular for pressure (LVEDP 19 mmHg). Successful PCI and intracoronary lithotripsy to ramus  intermedius with reduction of in-stent restenosis to 20-30% and reduction of de-novo lesion to 0% with TIMI-3 flow.   Recommendations: Overnight observation. Dual antiplatelet therapy with aspirin and clopidogrel for at least 12 months. Aggressive secondary prevention of coronary artery disease.   [name], MD [facility]   Coronary Diagrams   Diagnostic Dominance: Right  Intervention        Echo  07/2022 1. Left ventricular ejection fraction, by estimation, is 55 to 60%. The  left ventricle has normal function. The left ventricle has no regional  wall motion abnormalities. Left ventricular diastolic parameters are  consistent with Grade II diastolic  dysfunction (pseudonormalization). The average left ventricular global  longitudinal strain is -19.5 %. The global longitudinal strain is normal.   2. Right ventricular systolic function is normal. The right ventricular  size is normal.   3. The mitral valve is normal in structure. Mild mitral valve  regurgitation.   4. The aortic valve is tricuspid. Aortic valve regurgitation is not  visualized.     Patient Profile     58 y.o. male  CAD with stenting to the RI in 2015, HFrEF 2/2 ICM, carotid artery disease s/p right carotid stenting 07/31/2022, polysubstance use (ETOH and cocaine use) HTN, and HLD who is being seen 08/20/2023 for the evaluation of chest pain and tachycardia   Assessment & Plan    Chest pain/Elevated troponin CAD s/p PCI/DES 08/2022 - patient presented with chest pain found to have new onset Afib RVR, severe hypertension and mildly elevated troponin in the setting of significant alcohol use and medication non-compliance - HS trop 62>70>34 - EKG showed Afib RVR rates 140s>now in NSR - he reported he has been without meds for 1 months - restart PTA ASA 81mg  daily, Plavix 75mg  daily, Crestor 40mg  daily, Toprol 75mg  daily - he reports mild chest pain on interview - continue IV heparin x 48 hours - echo  showed LVEF 55-60%. No WMA, G1DD, mild MR.  - low suspicion for ACS, however cannot completely rule out. Reasons for demand ischemia would be arrhythmia and elevated BP. Continue medical therapy for now   Tachyarrhythmia, New onset Afib RVR - EKG and tele show Afib with rates up to 140s - now in NSR - patient reported palpitations prior to admission - Afib in the setting of significant alcohol use - CHADSVASC at least 3 (HTN, CHF, PAD). He is on IV heparin, but would require long-term a/c with Eliquis  - TSH wnl - continue BB for rate control   Syncope - seems to occur when he is drinking - afib could also be contributing - telemetry eiyh NSR - echo with normal LVEF - Carotid US showed >69% stenosis in the L and <50% stenosis in the right. Follow-up with [facility] - will likely need heart monitor at d/c. H/o SVT as well   HFrEF - history of ICM with improved LVEF - echo this admission LVEF 55-60%. No WMA, G1DD, mild MR.  - he appears euvolemic   Polysubstance use - he reports he has been drinking 3-12 beers most days - CIWA per IM - denies recent drug use - alcohol cessation encouraged   Hypertensive Urgency - Bps 199/100 - reports he has not been on cardiac meds for 1 month - PTA Imdur 60mg  BID, irbesartan 150mg  daily, metoprolol 75mg  daily restarted - BP improved    For questions or updates, please contact [facility] Please consult [email or web address] for contact info under        Signed, Jacqueline Delapena David Stall, PA-C  08/21/2023, 9:35 AM

## 2023-08-21 NOTE — Discharge Instructions (Signed)
Patient advised on smoking and alcohol abstinence

## 2023-08-21 NOTE — Consult Note (Signed)
PHARMACY - ANTICOAGULATION CONSULT NOTE  Pharmacy Consult for Heparin Indication: chest pain/ACS  Allergies  Allergen Reactions   Oxycodone-Acetaminophen Other (See Comments)   Codeine Rash   Patient Measurements: Height: 5\' 3"  (160 cm) Weight: 80.2 kg (176 lb 14.4 oz) IBW/kg (Calculated) : 56.9 Heparin Dosing Weight: 74.3 kg   Vital Signs: BP: 94/61 (02/01 0305) Pulse Rate: 81 (02/01 0305)  Labs: Recent Labs    08/19/23 1518 08/19/23 1743 08/19/23 1914 08/20/23 0051 08/20/23 0701 08/20/23 0950 08/20/23 2202 08/21/23 0617  HGB 16.0  --   --  16.2  --   --  15.4 14.0  HCT 45.9  --   --  46.4  --   --  44.7 40.4  PLT 285  --   --  278  --   --   --  220  APTT  --   --  149*  --   --   --   --   --   LABPROT 12.3  --   --   --   --   --   --   --   INR 0.9  --   --   --   --   --   --   --   HEPARINUNFRC  --   --   --  0.39 0.33  --   --  0.24*  CREATININE 0.87  --   --   --   --   --   --   --   TROPONINIHS 62* 70*  --   --   --  34*  --   --    Estimated Creatinine Clearance: 87.7 mL/min (by C-G formula based on SCr of 0.87 mg/dL).  Medical History: Past Medical History:  Diagnosis Date   CAD in native artery    a. 06/28/2014: STEMI, cath LM nl, mLAD 50%, mLCx 20%, OM1 small in size 80%, ramus 100% s/p PCI/DES 0%, EF 40%    Carotid artery disease (HCC)    CHF (congestive heart failure) (HCC)    Cocaine abuse (HCC)    Depression    H/O gastric ulcer    Hyperglycemia    a. A1C 5.6%   Hypertension    Ischemic cardiomyopathy    a. EF 40% by cath 06/28/2014   MI (myocardial infarction) (HCC)    Polysubstance abuse (HCC)    a. cocaine, tobacco, and alcohol    Tobacco abuse    Medications:  No PTA anticoagulation noted from chart review   Assessment: Marcus [name] is a 58 year old male that presented with chest pain and numbness to both arms. Elevated troponin of 62. From chart review, patient is not on any PTA anticoagulation. Pharmacy has been consulted  for initation and management of heparin infusion. Baseline labs: Hgb 16.0, PLT 285, INR 0.9, aPTT has been ordered.   1/31:  HL @ 0051 = 0.39, therapeutic X 1 1/31 0701 HL 0.33 Therapeutic x2 2/01 0617 HL 0.24      SUBtherapeutic   Goal of Therapy:  Heparin level 0.3-0.7 units/ml Monitor platelets by anticoagulation protocol: Yes   Plan:  2/01:  HL @ 0617 = 0.24, SUBtherapeutic  - Will order heparin 1100 units IV X 1 bolus and increase drip rate to 1050 units/hr - Will recheck HL 6 hrs after rate change Monitor CBC daily and for signs/symptoms of bleeding   Alayshia Marini D, PharmD 08/21/2023 7:02 AM

## 2023-08-26 ENCOUNTER — Encounter: Payer: Self-pay | Admitting: Medical

## 2023-08-26 ENCOUNTER — Ambulatory Visit: Payer: Medicaid Other | Attending: Medical | Admitting: Medical

## 2023-08-26 VITALS — BP 160/82 | HR 79 | Ht 63.0 in | Wt 171.8 lb

## 2023-08-26 DIAGNOSIS — I25118 Atherosclerotic heart disease of native coronary artery with other forms of angina pectoris: Secondary | ICD-10-CM

## 2023-08-26 DIAGNOSIS — I480 Paroxysmal atrial fibrillation: Secondary | ICD-10-CM | POA: Diagnosis present

## 2023-08-26 DIAGNOSIS — F1910 Other psychoactive substance abuse, uncomplicated: Secondary | ICD-10-CM | POA: Diagnosis present

## 2023-08-26 DIAGNOSIS — I255 Ischemic cardiomyopathy: Secondary | ICD-10-CM | POA: Diagnosis present

## 2023-08-26 DIAGNOSIS — I5020 Unspecified systolic (congestive) heart failure: Secondary | ICD-10-CM

## 2023-08-26 DIAGNOSIS — E782 Mixed hyperlipidemia: Secondary | ICD-10-CM | POA: Diagnosis present

## 2023-08-26 DIAGNOSIS — R079 Chest pain, unspecified: Secondary | ICD-10-CM | POA: Diagnosis not present

## 2023-08-26 DIAGNOSIS — I10 Essential (primary) hypertension: Secondary | ICD-10-CM

## 2023-08-26 MED ORDER — EZETIMIBE 10 MG PO TABS: 10.0000 mg | ORAL_TABLET | Freq: Every day | ORAL | 3 refills | Status: AC

## 2023-08-26 MED ORDER — IRBESARTAN 300 MG PO TABS: 300.0000 mg | ORAL_TABLET | Freq: Every day | ORAL | 3 refills | Status: DC

## 2023-08-26 NOTE — Progress Notes (Signed)
 Cardiology Office Note:  .   Date:  08/26/2023  ID:  Marcus [name], DOB 27-Jul-1965, MRN [id number] PCP: Macdonald Ping, NP  [facility] Providers Cardiologist:  Evalene Lunger, MD {   History of Present Illness: .   Marcus [name] is a 58 y.o. 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  The patient was seen in January 2024 reporting chest pain and dizziness.  Carotid ultrasound was ordered and he was set up for cardiac cath.  Cardiac cath 08/26/2022 showed severe single vessel CAD with 50-60% ISR of RI stent and 90% de novo lesion just distal to the stent, mild to moderate non-critical disease, mildly elevated LVEDP . The patient was treated with successful PCI and intracoronary lithotripsy to ramus intermedius with reduction of ISR to 20-30% and reduction of de-novo lesion to 0%. The patient had post-cath chest pain and was given SL NTG x 2 with resolution of pain. EKG showed no changes. He also reported chest pain during the procedure. The patient was kept overnight for observation. Patient was started on Imdur  30 mg daily at discharge.   carotid ultrasound showed 60 to 79% stenosis in the right internal carotid artery with 80 to 90% left internal carotid artery and left subclavian artery stenosis.  CTA head and neck showed severe critical stenosis with 80% PR VA in the left ICA with 50% stenosis.  The patient underwent carotid stenting 08/31/22.  The patient presented to the ER 08/19/23 with chest pain and tachycardia.  He reported he had been off his medications at  least a month and daily beer intake.  In the ER, he was found to be in new onset atrial fibrillation with hypertensive emergency.  Patient was started on IV heparin  and IV diltiazem  with conversion to normal sinus  rhythm.  Home BP medications were restarted. Patient was later transition to Eliquis  and metoprolol .  HS troponins were mildly elevated, felt to be supply/demand mismatch in the setting of rapid A-fib and hypertension.  Echo showed EF of 55 to 60%.  In the setting of Eliquis , aspirin  and Plavix  were held.  Today, the patient reports he has been Ok since being home. He has been taking all his medications. Says it took a couple days to get back to feeling normal, he felt he had low energy. Has not drank alcohol since discharge. Has occasional chest tightness. No shortness of breath. EKG shows NSR. He sees vascular surgery next month. BP today is high. No lower leg edema, fever, chills, N/V.  Studies Reviewed: SABRA   EKG Interpretation Date/Time:  Thursday August 26 2023 10:22:31 EST Ventricular Rate:  75 PR Interval:  152 QRS Duration:  126 QT Interval:  398 QTC Calculation: 444 R Axis:   132  Text Interpretation: Normal sinus rhythm Right bundle branch block Lateral infarct , age undetermined When compared with ECG of 19-Aug-2023 17:43, PREVIOUS ECG IS PRESENT Confirmed by Franchester, [place] Flett ([id number]) on 08/26/2023 10:29:56 AM    Echo 08/20/23 1. Left ventricular ejection fraction, by estimation, is 55 to 60%. The  left ventricle has normal function. The left ventricle has no regional  wall motion abnormalities. There is mild left ventricular hypertrophy.  Left ventricular diastolic parameters  are consistent with Grade I diastolic dysfunction (impaired relaxation).   2. Right ventricular systolic function  is normal. The right ventricular  size is normal.   3. The mitral valve is normal in structure. Mild mitral valve  regurgitation.   4. The aortic valve was not well visualized. Aortic valve regurgitation  is not visualized.   5. The inferior vena cava is normal in size with greater than 50%  respiratory variability, suggesting right atrial pressure of 3 mmHg.   Heart monitor 11/2022 Event  monitor Patch Wear Time:  13 days and 13 hours (2024-04-06T18:22:38-0400 to 2024-04-20T08:13:44-0400)   Normal sinus rhythm Patient had a min HR of 45 bpm, max HR of 169 bpm, and avg HR of 76 bpm.    1 run of Ventricular Tachycardia occurred lasting 5 beats with a max rate of 169 bpm (avg 156 bpm).    2 Supraventricular Tachycardia runs occurred, the run with the fastest interval lasting 5 beats with a max rate of 117 bpm (avg 105 bpm); the run with the fastest interval was also the longest.    Isolated SVEs were rare (<1.0%), SVE Couplets were rare (<1.0%), and no SVE Triplets were present.    Isolated VEs were rare (<1.0%, 230), VE Couplets were rare (<1.0%, 4), and VE Triplets were rare (<1.0%, 1).    Patient triggered events (19) associated with normal sinus rhythm    Cardiac cath 07/2022 Conclusions: Severe single vessel coronary artery disease with 50-60% in-stent restenosis of ramus stent and 90% de-novo lesion just distal to the stent. Mild-moderate, noncritical disease involving the LAD, LCx, and RCA.  There is a 70% stenosis in the apical LAD, which is too small/distal for percutaneous intervention Mildly elevated left ventricular for pressure (LVEDP 19 mmHg). Successful PCI and intracoronary lithotripsy to ramus intermedius with reduction of in-stent restenosis to 20-30% and reduction of de-novo lesion to 0% with TIMI-3 flow.   Recommendations: Overnight observation. Dual antiplatelet therapy with aspirin  and clopidogrel  for at least 12 months. Aggressive secondary prevention of coronary artery disease.   [name], MD [facility]   Coronary Diagrams   Diagnostic Dominance: Right  Intervention        Echo 07/2022 1. Left ventricular ejection fraction, by estimation, is 55 to 60%. The  left ventricle has normal function. The left ventricle has no regional  wall motion abnormalities. Left ventricular diastolic parameters are  consistent with Grade II  diastolic  dysfunction (pseudonormalization). The average left ventricular global  longitudinal strain is -19.5 %. The global longitudinal strain is normal.   2. Right ventricular systolic function is normal. The right ventricular  size is normal.   3. The mitral valve is normal in structure. Mild mitral valve  regurgitation.   4. The aortic valve is tricuspid. Aortic valve regurgitation is not  visualized.      Physical Exam:   VS:  BP (!) 160/82 (BP Location: Left Arm, Patient Position: Sitting, Cuff Size: Normal)   Pulse 79   Ht 5' 3 (1.6 m)   Wt 171 lb 12.8 oz (77.9 kg)   SpO2 99%   BMI 30.43 kg/m    Wt Readings from Last 3 Encounters:  08/26/23 171 lb 12.8 oz (77.9 kg)  08/19/23 176 lb 14.4 oz (80.2 kg)  04/21/23 175 lb 9.6 oz (79.7 kg)    GEN: Well nourished, well developed in no acute distress NECK: No JVD; No carotid bruits CARDIAC: RRR, no murmurs, rubs, gallops RESPIRATORY:  Clear to auscultation without rales, wheezing or rhonchi  ABDOMEN: Soft, non-tender, non-distended EXTREMITIES:  No edema; No deformity  ASSESSMENT AND PLAN: .    Paroxsymal Afib Recent admission for chest pain, mildly elevated troponin, medication noncompliance, severe hypertension, new onset afib RVR, and alcohol use.  Patient was started on IV Dilt with conversion to normal sinus rhythm.  High-sensitivity troponin elevated to 71 with flat trend, suspected supply/demand mismatch.  Given CHA2DS2-VASc of 3 the patient was placed on Eliquis  5 mg twice daily.  Aspirin  and Plavix  were discontinued.  Repeat echo showed improved EF 55 to 60%, no wall motion abnormality, grade 1 diastolic dysfunction, failure.  Today, patient is in normal sinus rhythm.  He reports compliance with Eliquis .  He denies bleeding issues, CBC today.  Continue Eliquis  5 mg twice daily for stroke prophylaxis and Toprol  75 mg daily for heart rate control.  CAD s/p PCI/DES 08/2022 Chest tightness Patient reports persistent chest  tightness, this has been an ongoing issue.  I will order a cardiac PET stress test. No Aspirin /Plavix  given Eliquis .  Continue, Toprol , statin, Imdur , SL nitroglycerin .  HFimpEF ICM Echo during hospitalization showed improved EF of 55 to 60%, grade 1 diastolic dysfunction.  Patient is euvolemic on exam. Continue BB therapy.  Hypertension Blood pressure today 160/82.  I will increase Irbesartan  to 300 mg daily.  Continue Toprol  75 mg daily and Imdur  60 mg twice daily.  HLD LDL 78. I will add Zetia  10mg  daily. Continue Crestor  40mg  daily.   Polysubstance use Patient denies any further alcohol consumption since discharge.    Informed Consent   Shared Decision Making/Informed Consent The risks [chest pain, shortness of breath, cardiac arrhythmias, dizziness, blood pressure fluctuations, myocardial infarction, stroke/transient ischemic attack, nausea, vomiting, allergic reaction, radiation exposure, metallic taste sensation and life-threatening complications (estimated to be 1 in 10,000)], benefits (risk stratification, diagnosing coronary artery disease, treatment guidance) and alternatives of a cardiac PET stress test were discussed in detail with Mr. Melhorn and he agrees to proceed.     Dispo: Follow-up in 2 months  Signed, Mildreth Reek VEAR Fishman, PA-C

## 2023-08-26 NOTE — Patient Instructions (Signed)
 Medication Instructions:  Your physician recommends the following medication changes.  START TAKING: Zetia  10 mg by mouth daily  INCREASE: Irbesartan  to 300 mg by mouth daily   *If you need a refill on your cardiac medications before your next appointment, please call your pharmacy*   Lab Work: Your provider would like for you to have following labs drawn today (CBC).     Testing/Procedures: Cardiac PET Please see instructions below   Follow-Up: At [facility], you and your health needs are our priority.  As part of our continuing mission to provide you with exceptional heart care, we have created designated Provider Care Teams.  These Care Teams include your primary Cardiologist (physician) and Advanced Practice Providers (APPs -  Physician Assistants and Nurse Practitioners) who all work together to provide you with the care you need, when you need it.  We recommend signing up for the patient portal called MyChart.  Sign up information is provided on this After Visit Summary.  MyChart is used to connect with patients for Virtual Visits (Telemedicine).  Patients are able to view lab/test results, encounter notes, upcoming appointments, etc.  Non-urgent messages can be sent to your provider as well.   To learn more about what you can do with MyChart, go to [email or web address].    Your next appointment:   2 month(s)  Provider:   You may see Timothy Gollan, MD or one of the following Advanced Practice Providers on your designated Care Team:   Lonni Meager, NP Bernardino Bring, PA-C Cadence Franchester, PA-C Tylene Lunch, NP Barnie Hila, NP       Please report to Radiology at [facility] Main Entrance, medical mall, 30 mins prior to your test.  [address]  [place], [place]  How to Prepare for Your Cardiac PET/CT Stress Test:  Nothing to eat or drink, except water, 3 hours prior to arrival time.  NO caffeine /decaffeinated  products, or chocolate 12 hours prior to arrival. (Please note decaffeinated beverages (teas/coffees) still contain caffeine ).  If you have caffeine  within 12 hours prior, the test will need to be rescheduled.  Medication instructions: Do not take erectile dysfunction medications for 72 hours prior to test (sildenafil, tadalafil) Do not take nitrates (isosorbide  mononitrate, Ranexa) the day before or day of test Do not take tamsulosin the day before or morning of test Hold theophylline containing medications for 12 hours. Hold Dipyridamole 48 hours prior to the test.  Diabetic Preparation: If able to eat breakfast prior to 3 hour fasting, you may take all medications, including your insulin. Do not worry if you miss your breakfast dose of insulin - start at your next meal. If you do not eat prior to 3 hour fast-Hold all diabetes (oral and insulin) medications. Patients who wear a continuous glucose monitor MUST remove the device prior to scanning.  You may take your remaining medications with water.  NO perfume, cologne or lotion on chest or abdomen area. FEMALES - Please avoid wearing dresses to this appointment.  Total time is 1 to 2 hours; you may want to bring reading material for the waiting time.  IF YOU THINK YOU MAY BE PREGNANT, OR ARE NURSING PLEASE INFORM THE TECHNOLOGIST.  In preparation for your appointment, medication and supplies will be purchased.  Appointment availability is limited, so if you need to cancel or reschedule, please call the Radiology Department Scheduler at [phone] 24 hours in advance to avoid a cancellation fee of $100.00  What to Expect  When you Arrive:  Once you arrive and check in for your appointment, you will be taken to a preparation room within the Radiology Department.  A technologist or Nurse will obtain your medical history, verify that you are correctly prepped for the exam, and explain the procedure.  Afterwards, an IV will be started in  your arm and electrodes will be placed on your skin for EKG monitoring during the stress portion of the exam. Then you will be escorted to the PET/CT scanner.  There, staff will get you positioned on the scanner and obtain a blood pressure and EKG.  During the exam, you will continue to be connected to the EKG and blood pressure machines.  A small, safe amount of a radioactive tracer will be injected in your IV to obtain a series of pictures of your heart along with an injection of a stress agent.    After your Exam:  It is recommended that you eat a meal and drink a caffeinated beverage to counter act any effects of the stress agent.  Drink plenty of fluids for the remainder of the day and urinate frequently for the first couple of hours after the exam.  Your doctor will inform you of your test results within 7-10 business days.  For more information and frequently asked questions, please visit our website: [email or web address]  For questions about your test or how to prepare for your test, please call: Cardiac Imaging Nurse Navigators Office: [phone]

## 2023-08-27 LAB — CBC
Hematocrit: 44.8 % (ref 37.5–51.0)
Hemoglobin: 14.9 g/dL (ref 13.0–17.7)
MCH: 31.7 pg (ref 26.6–33.0)
MCHC: 33.3 g/dL (ref 31.5–35.7)
MCV: 95 fL (ref 79–97)
Platelets: 271 10*3/uL (ref 150–450)
RBC: 4.7 x10E6/uL (ref 4.14–5.80)
RDW: 13.3 % (ref 11.6–15.4)
WBC: 7 10*3/uL (ref 3.4–10.8)

## 2023-09-21 ENCOUNTER — Ambulatory Visit (INDEPENDENT_AMBULATORY_CARE_PROVIDER_SITE_OTHER): Payer: Medicaid Other

## 2023-09-21 ENCOUNTER — Ambulatory Visit (INDEPENDENT_AMBULATORY_CARE_PROVIDER_SITE_OTHER): Payer: Medicare Other | Admitting: Nurse Practitioner

## 2023-09-21 ENCOUNTER — Encounter (INDEPENDENT_AMBULATORY_CARE_PROVIDER_SITE_OTHER): Payer: Self-pay | Admitting: Nurse Practitioner

## 2023-09-21 VITALS — BP 161/83 | HR 65 | Resp 18 | Ht 63.0 in | Wt 167.2 lb

## 2023-09-21 DIAGNOSIS — I6523 Occlusion and stenosis of bilateral carotid arteries: Secondary | ICD-10-CM | POA: Diagnosis not present

## 2023-09-21 DIAGNOSIS — I10 Essential (primary) hypertension: Secondary | ICD-10-CM

## 2023-09-21 DIAGNOSIS — E782 Mixed hyperlipidemia: Secondary | ICD-10-CM

## 2023-09-21 DIAGNOSIS — I6521 Occlusion and stenosis of right carotid artery: Secondary | ICD-10-CM | POA: Diagnosis not present

## 2023-09-21 NOTE — Progress Notes (Signed)
 Subjective:    Patient ID: Marcus [name], male    DOB: 06-12-1966, 58 y.o.   MRN: [id number] Chief Complaint  Patient presents with   Follow-up    fu + Carotid    The patient is seen for follow up evaluation of carotid stenosis. The carotid stenosis followed by ultrasound.   The patient denies amaurosis fugax. There is no recent history of TIA symptoms or focal motor deficits. There is no prior documented CVA.  He does however note having recent lightheaded and presyncopal episodes.  This is all following some medication changes due to atrial fibrillation.  The patient is taking Eliquis and Plavix  There is no history of migraine headaches. There is no history of seizures.  No recent shortening of the patient's walking distance or new symptoms consistent with claudication.  No history of rest pain symptoms. No new ulcers or wounds of the lower extremities have occurred.  There is no history of DVT, PE or superficial thrombophlebitis. No documented recent episodes of angina or shortness of breath documented.   Carotid Duplex done today shows less than 50% stenosis of the right ICA stent.  However his left has 80 to 99% stenosis, estimated to be near 80%.  He has antegrade flow in the bilateral vertebrals with normal flow in the bilateral subclavian arteries.    Review of Systems  Neurological:  Positive for syncope and light-headedness.  All other systems reviewed and are negative.      Objective:   Physical Exam Vitals reviewed.  Neck:     Vascular: Carotid bruit present.  Cardiovascular:     Rate and Rhythm: Normal rate and regular rhythm.     Pulses:          Radial pulses are 2+ on the right side and 2+ on the left side.  Pulmonary:     Effort: Pulmonary effort is normal.  Skin:    General: Skin is warm and dry.  Neurological:     Mental Status: He is alert and oriented to person, place, and time.  Psychiatric:        Mood and Affect: Mood normal.         Behavior: Behavior normal.        Thought Content: Thought content normal.        Judgment: Judgment normal.     BP (!) 161/83   Pulse 65   Resp 18   Ht 5\' 3"  (1.6 m)   Wt 167 lb 3.2 oz (75.8 kg)   BMI 29.62 kg/m   Past Medical History:  Diagnosis Date   CAD in native artery    a. 06/28/2014: STEMI, cath LM nl, mLAD 50%, mLCx 20%, OM1 small in size 80%, ramus 100% s/p PCI/DES 0%, EF 40%    Carotid artery disease (HCC)    CHF (congestive heart failure) (HCC)    Cocaine abuse (HCC)    Depression    H/O gastric ulcer    Hyperglycemia    a. A1C 5.6%   Hypertension    Ischemic cardiomyopathy    a. EF 40% by cath 06/28/2014   MI (myocardial infarction) (HCC)    Polysubstance abuse (HCC)    a. cocaine, tobacco, and alcohol    Tobacco abuse     Social History   Socioeconomic History   Marital status: Single    Spouse name: Not on file   Number of children: Not on file   Years of education: Not on file  Highest education level: Not on file  Occupational History   Not on file  Tobacco Use   Smoking status: Every Day    Current packs/day: 1.00    Average packs/day: 1 pack/day for 48.2 years (48.2 ttl pk-yrs)    Types: Cigarettes    Start date: 07/21/1975   Smokeless tobacco: Never   Tobacco comments:    Pt. Is actively trying to quit and is wearing the patch.   Vaping Use   Vaping status: Never Used  Substance and Sexual Activity   Alcohol use: Not Currently    Alcohol/week: 35.0 standard drinks of alcohol    Types: 35 Cans of beer per week   Drug use: No    Types: Cocaine, Marijuana    Comment: none in at least 2 years   Sexual activity: Not on file  Other Topics Concern   Not on file  Social History Narrative   Not on file   Social Drivers of Health   Financial Resource Strain: Not on file  Food Insecurity: Not on file  Transportation Needs: Not on file  Physical Activity: Not on file  Stress: Not on file  Social Connections: Not on file  Intimate  Partner Violence: Not on file    Past Surgical History:  Procedure Laterality Date   CARDIAC CATHETERIZATION  06/28/2014   x1 stent   CAROTID PTA/STENT INTERVENTION Right 08/31/2022   Procedure: CAROTID PTA/STENT INTERVENTION;  Surgeon: Annice Needy, MD;  Location: [facility] INVASIVE CV LAB;  Service: Cardiovascular;  Laterality: Right;   COLONOSCOPY N/A 08/12/2022   Procedure: COLONOSCOPY;  Surgeon: Toledo, Boykin Nearing, MD;  Location: [facility] ENDOSCOPY;  Service: Gastroenterology;  Laterality: N/A;   CORONARY STENT INTERVENTION N/A 08/26/2022   Procedure: CORONARY STENT INTERVENTION;  Surgeon: [name], MD;  Location: [facility] INVASIVE CV LAB;  Service: Cardiovascular;  Laterality: N/A;   ESOPHAGOGASTRODUODENOSCOPY N/A 08/12/2022   Procedure: ESOPHAGOGASTRODUODENOSCOPY (EGD);  Surgeon: Toledo, Boykin Nearing, MD;  Location: [facility] ENDOSCOPY;  Service: Gastroenterology;  Laterality: N/A;   LEFT HEART CATH AND CORONARY ANGIOGRAPHY Left 08/26/2022   Procedure: LEFT HEART CATH AND CORONARY ANGIOGRAPHY;  Surgeon: [name], MD;  Location: [facility] INVASIVE CV LAB;  Service: Cardiovascular;  Laterality: Left;   NISSEN FUNDOPLICATION     stomach ulcer surgery      Family History  Problem Relation Age of Onset   Heart failure Mother    Stroke Father    Heart failure Father    Prostate cancer Brother    Heart attack Brother     Allergies  Allergen Reactions   Oxycodone-Acetaminophen Other (See Comments)   Codeine Rash       Latest Ref Rng & Units 08/26/2023   10:34 AM 08/21/2023    6:17 AM 08/20/2023   10:02 PM  CBC  WBC 3.4 - 10.8 x10E3/uL 7.0  6.9    Hemoglobin 13.0 - 17.7 g/dL 09.8  11.9  14.7   Hematocrit 37.5 - 51.0 % 44.8  40.4  44.7   Platelets 150 - 450 x10E3/uL 271  220        CMP     Component Value Date/Time   NA 140 08/19/2023 1518   NA 139 06/29/2014 0353   K 3.8 08/19/2023 1518   K 3.6 06/29/2014 0353   CL 98 08/19/2023 1518   CL 107 06/29/2014 0353   CO2 23 08/19/2023 1518    CO2 25 06/29/2014 0353   GLUCOSE 107 (H) 08/19/2023 1518   GLUCOSE 109 (H)  06/29/2014 0353   BUN 14 08/19/2023 1518   BUN 8 06/29/2014 0353   CREATININE 0.87 08/19/2023 1518   CREATININE 0.92 06/29/2014 0353   CALCIUM 9.3 08/19/2023 1518   CALCIUM 8.6 06/29/2014 0353   PROT 7.7 08/12/2022 1540   PROT 7.6 06/28/2014 0718   ALBUMIN 4.2 08/12/2022 1540   ALBUMIN 3.7 06/28/2014 0718   AST 31 08/12/2022 1540   AST 257 (H) 06/28/2014 0718   ALT 36 08/12/2022 1540   ALT 48 06/28/2014 0718   ALKPHOS 60 08/12/2022 1540   ALKPHOS 86 06/28/2014 0718   BILITOT 0.6 08/12/2022 1540   BILITOT 0.2 06/28/2014 0718   GFRNONAA >60 08/19/2023 1518   GFRNONAA >60 06/29/2014 0353     No results found.     Assessment & Plan:   1. Stenosis of right carotid artery (Primary) Recommend:  The patient has some symptoms that are possibly related to his worsening carotid stenosis.  However, the patient has now progressed and has a lesion the is >75%.  Patient should undergo CT angiography of the carotid arteries to define the degree of stenosis of the internal carotid arteries bilaterally and the anatomic suitability for surgery.  If the patient does indeed need surgery cardiac clearance will be required, once cleared the patient will be scheduled for surgery.  The risks, benefits and alternative therapies were reviewed in detail with the patient.  All questions were answered.  The patient agrees to proceed with imaging.  Continue antiplatelet therapy as prescribed. Continue management of CAD, HTN and Hyperlipidemia. Healthy heart diet, encouraged exercise at least 4 times per week.  - CT ANGIO NECK W OR WO CONTRAST; Future  2. Primary hypertension Continue antihypertensive medications as already ordered, these medications have been reviewed and there are no changes at this time.  3. Mixed hyperlipidemia Continue statin as ordered and reviewed, no changes at this time   Current Outpatient  Medications on File Prior to Visit  Medication Sig Dispense Refill   apixaban (ELIQUIS) 5 MG TABS tablet Take 1 tablet (5 mg total) by mouth 2 (two) times daily. 60 tablet 1   clopidogrel (PLAVIX) 75 MG tablet Take 75 mg by mouth daily.     DULoxetine (CYMBALTA) 30 MG capsule Take 1 capsule (30 mg total) by mouth daily. 30 capsule 3   ezetimibe (ZETIA) 10 MG tablet Take 1 tablet (10 mg total) by mouth daily. 90 tablet 3   hydrOXYzine (VISTARIL) 25 MG capsule Take 25 mg by mouth 2 (two) times daily.     irbesartan (AVAPRO) 300 MG tablet Take 1 tablet (300 mg total) by mouth daily. 90 tablet 3   isosorbide mononitrate (IMDUR) 60 MG 24 hr tablet Take 1 tablet (60 mg total) by mouth 2 (two) times daily. 60 tablet 1   metoprolol succinate (TOPROL-XL) 25 MG 24 hr tablet Take 3 tablets (75 mg total) by mouth daily. Take with or immediately following a meal. 90 tablet 1   mometasone-formoterol (DULERA) 200-5 MCG/ACT AERO Inhale 2 puffs into the lungs 2 (two) times daily. 1 each 0   Multiple Vitamin (MULTIVITAMIN WITH MINERALS) TABS tablet Take 1 tablet by mouth daily. 30 tablet 0   nicotine (NICODERM CQ - DOSED IN MG/24 HOURS) 21 mg/24hr patch Place 21 mg onto the skin daily as needed.     nitroGLYCERIN (NITROSTAT) 0.4 MG SL tablet Place 1 tablet (0.4 mg total) under the tongue every 5 (five) minutes as needed for chest pain. 25 tablet 0  pantoprazole (PROTONIX) 40 MG tablet Take 1 tablet (40 mg total) by mouth daily. 30 tablet 1   QUEtiapine (SEROQUEL) 200 MG tablet Take 1 tablet (200 mg total) by mouth at bedtime. 30 tablet 1   rosuvastatin (CRESTOR) 40 MG tablet Take 1 tablet (40 mg total) by mouth daily. 30 tablet 1   SPIRIVA RESPIMAT 1.25 MCG/ACT AERS Inhale 2 sprays into the lungs daily. 4 g 1   VENTOLIN HFA 108 (90 Base) MCG/ACT inhaler Inhale 1-2 puffs into the lungs every 4 (four) hours as needed for shortness of breath or wheezing. SMARTSIG:2 Puff(s) By Mouth Every 4 Hours PRN 8 g 1   No  current facility-administered medications on file prior to visit.    There are no Patient Instructions on file for this visit. No follow-ups on file.   Georgiana Spinner, NP

## 2023-09-27 ENCOUNTER — Telehealth (INDEPENDENT_AMBULATORY_CARE_PROVIDER_SITE_OTHER): Payer: Self-pay | Admitting: Vascular Surgery

## 2023-09-27 NOTE — Telephone Encounter (Signed)
 LVM for pt TCB and schedule an appt for : CT results. see jd

## 2023-09-29 ENCOUNTER — Ambulatory Visit: Admission: RE | Admit: 2023-09-29 | Source: Ambulatory Visit

## 2023-09-29 ENCOUNTER — Telehealth (HOSPITAL_COMMUNITY): Payer: Self-pay | Admitting: *Deleted

## 2023-09-29 NOTE — Telephone Encounter (Signed)
 Attempted to call patient regarding upcoming cardiac PET appointment. Left message on voicemail with name and callback number Johney Frame RN Navigator Cardiac Imaging [facility] [phone] Office  Advised to avoid caffeine for 12 hours prior to test and hold imdur.

## 2023-09-30 ENCOUNTER — Ambulatory Visit: Admission: RE | Admit: 2023-09-30 | Payer: Medicare Other | Source: Ambulatory Visit

## 2023-10-06 ENCOUNTER — Telehealth (HOSPITAL_COMMUNITY): Payer: Self-pay | Admitting: *Deleted

## 2023-10-06 NOTE — Telephone Encounter (Signed)
 Attempted to call patient regarding upcoming cardiac PET appointment. Left message on voicemail with name and callback number Johney Frame RN Navigator Cardiac Imaging [facility] [phone] Office

## 2023-10-07 ENCOUNTER — Ambulatory Visit
Admission: RE | Admit: 2023-10-07 | Discharge: 2023-10-07 | Disposition: A | Discharge status: Home / Self Care | Source: Ambulatory Visit | Attending: Medical | Admitting: Medical

## 2023-10-07 DIAGNOSIS — R079 Chest pain, unspecified: Secondary | ICD-10-CM | POA: Diagnosis present

## 2023-10-07 DIAGNOSIS — I700 Atherosclerosis of aorta: Secondary | ICD-10-CM | POA: Insufficient documentation

## 2023-10-07 DIAGNOSIS — I2510 Atherosclerotic heart disease of native coronary artery without angina pectoris: Secondary | ICD-10-CM | POA: Insufficient documentation

## 2023-10-07 DIAGNOSIS — K8020 Calculus of gallbladder without cholecystitis without obstruction: Secondary | ICD-10-CM | POA: Insufficient documentation

## 2023-10-07 DIAGNOSIS — K760 Fatty (change of) liver, not elsewhere classified: Secondary | ICD-10-CM | POA: Diagnosis not present

## 2023-10-07 LAB — NM PET CT CARDIAC PERFUSION MULTI W/ABSOLUTE BLOODFLOW
LV dias vol: 122 mL (ref 62–150)
LV sys vol: 62 mL
MBFR: 2.04
Nuc Rest EF: 46 %
Nuc Stress EF: 49 %
Peak HR: 93 {beats}/min
Rest HR: 66 {beats}/min
Rest MBF: 0.71 ml/g/min
Rest Nuclear Isotope Dose: 19.7 mCi
SRS: 11
SSS: 15
ST Depression (mm): 0 mm
Stress MBF: 1.45 ml/g/min
Stress Nuclear Isotope Dose: 19.7 mCi
TID: 1.1

## 2023-10-07 MED ORDER — RUBIDIUM RB82 GENERATOR (RUBYFILL)
25.0000 | PACK | Freq: Once | INTRAVENOUS | Status: AC
  Administered 2023-10-07: 19.69 via INTRAVENOUS

## 2023-10-07 MED ORDER — REGADENOSON 0.4 MG/5ML IV SOLN
INTRAVENOUS | Status: AC
  Filled 2023-10-07: qty 5

## 2023-10-07 MED ORDER — RUBIDIUM RB82 GENERATOR (RUBYFILL)
25.0000 | PACK | Freq: Once | INTRAVENOUS | Status: AC
  Administered 2023-10-07: 19.66 via INTRAVENOUS

## 2023-10-07 MED ORDER — REGADENOSON 0.4 MG/5ML IV SOLN
0.4000 mg | Freq: Once | INTRAVENOUS | Status: AC
  Administered 2023-10-07: 0.4 mg via INTRAVENOUS
  Filled 2023-10-07: qty 5

## 2023-10-07 MED ORDER — DEXTROSE 5 % IV SOLN
INTRAVENOUS | Status: AC
  Filled 2023-10-07: qty 50

## 2023-10-07 MED ORDER — CAFFEINE CITRATE BASE COMPONENT 10 MG/ML IV SOLN
INTRAVENOUS | Status: AC
  Filled 2023-10-07: qty 3

## 2023-10-07 NOTE — Progress Notes (Signed)
 Patient presents for a cardiac PET stress test and tolerated procedure without incident. Patient maintained acceptable vital signs throughout the test and was offered caffeine after test.  Patient ambulated out of department with a steady gait.

## 2023-10-14 ENCOUNTER — Telehealth (INDEPENDENT_AMBULATORY_CARE_PROVIDER_SITE_OTHER): Payer: Self-pay | Admitting: Vascular Surgery

## 2023-10-14 ENCOUNTER — Ambulatory Visit
Admission: RE | Admit: 2023-10-14 | Discharge: 2023-10-14 | Disposition: A | Discharge status: Home / Self Care | Source: Ambulatory Visit | Attending: Nurse Practitioner | Admitting: Nurse Practitioner

## 2023-10-14 DIAGNOSIS — I6521 Occlusion and stenosis of right carotid artery: Secondary | ICD-10-CM | POA: Insufficient documentation

## 2023-10-14 LAB — POCT I-STAT CREATININE: Creatinine, Ser: 1.1 mg/dL (ref 0.61–1.24)

## 2023-10-14 MED ORDER — IOHEXOL 350 MG/ML SOLN
75.0000 mL | Freq: Once | INTRAVENOUS | Status: AC | PRN
  Administered 2023-10-14: 75 mL via INTRAVENOUS

## 2023-10-14 NOTE — Progress Notes (Signed)
 Please bring in to review CT with JD

## 2023-10-14 NOTE — Telephone Encounter (Signed)
 LV for pt TCB and schedule an appt for CT results with Dr. Wyn Quaker.

## 2023-10-25 ENCOUNTER — Ambulatory Visit: Payer: Medicaid Other | Attending: Medical | Admitting: Medical

## 2023-10-25 NOTE — Progress Notes (Deleted)
 Cardiology Office Note:  .   Date:  10/25/2023  ID:  Marcus [name], DOB 05-Apr-1966, MRN [id number] PCP: Levonne Lapping, NP  [facility] Providers Cardiologist:  Julien Nordmann, MD {  History of Present Illness: .   Marcus [name] is a 58 y.o. 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   The patient was seen in January 2024 reporting chest pain and dizziness.  Carotid ultrasound was ordered and he was set up for cardiac cath.  Cardiac cath 08/26/2022 showed severe single vessel CAD with 50-60% ISR of RI stent and 90% de novo lesion just distal to the stent, mild to moderate non-critical disease, mildly elevated LVEDP . The patient was treated with successful PCI and intracoronary lithotripsy to ramus intermedius with reduction of ISR to 20-30% and reduction of de-novo lesion to 0%. The patient had post-cath chest pain and was given SL NTG x 2 with resolution of pain. EKG showed no changes. He also reported chest pain during the procedure. The patient was kept overnight for observation. Patient was started on Imdur 30 mg daily at discharge.    carotid ultrasound showed 60 to 79% stenosis in the right internal carotid artery with 80 to 90% left internal carotid artery and left subclavian artery stenosis.  CTA head and neck showed severe critical stenosis with 80% PR VA in the left ICA with 50% stenosis.  The patient underwent carotid stenting 08/31/22.   The patient presented to the ER 08/19/23 with chest pain and tachycardia.  He reported he had been off his medications at  least a month and daily beer intake.  In the ER, he was found to be in new onset atrial fibrillation with hypertensive emergency.  Patient was started on IV heparin and IV diltiazem with conversion to normal sinus  rhythm.  Home BP medications were restarted. Patient was later transition to Eliquis and metoprolol.  HS troponins were mildly elevated, felt to be supply/demand mismatch in the setting of rapid A-fib and hypertension.  Echo showed EF of 55 to 60%.  In the setting of Eliquis, aspirin and Plavix were held.the patient was seen in follow-up and remained in NSR. Cardiac PET stress was abnormal, no evidence of ischemia, evidence of infarction, EF 49%, intermediate risk.   Today,   ROS: ***  Studies Reviewed: .         Echo 08/25/2023 1. Left ventricular ejection fraction, by estimation, is 55 to 60%. The  left ventricle has normal function. The left ventricle has no regional  wall motion abnormalities. There is mild left ventricular hypertrophy.  Left ventricular diastolic parameters  are consistent with Grade I diastolic dysfunction (impaired relaxation).   2. Right ventricular systolic function is normal. The right ventricular  size is normal.   3. The mitral valve is normal in structure. Mild mitral valve  regurgitation.   4. The aortic valve was not well visualized. Aortic valve regurgitation  is not visualized.   5. The inferior vena cava is normal in size with greater than 50%  respiratory variability, suggesting right atrial pressure of 3 mmHg.   Heart monitor 11/2022 Event monitor Patch Wear Time:  13 days and 13 hours (2024-04-06T18:22:38-0400 to 2024-04-20T08:13:44-0400)   Normal sinus rhythm Patient had a min HR of 45 bpm, max HR of 169 bpm,  and avg HR of 76 bpm.    1 run of Ventricular Tachycardia occurred lasting 5 beats with a max rate of 169 bpm (avg 156 bpm).    2 Supraventricular Tachycardia runs occurred, the run with the fastest interval lasting 5 beats with a max rate of 117 bpm (avg 105 bpm); the run with the fastest interval was also the longest.    Isolated SVEs were rare (<1.0%), SVE Couplets were rare (<1.0%), and no SVE Triplets were present.    Isolated VEs  were rare (<1.0%, 230), VE Couplets were rare (<1.0%, 4), and VE Triplets were rare (<1.0%, 1).    Patient triggered events (19) associated with normal sinus rhythm    Cardiac cath 07/2022 Conclusions: Severe single vessel coronary artery disease with 50-60% in-stent restenosis of ramus stent and 90% de-novo lesion just distal to the stent. Mild-moderate, noncritical disease involving the LAD, LCx, and RCA.  There is a 70% stenosis in the apical LAD, which is too small/distal for percutaneous intervention Mildly elevated left ventricular for pressure (LVEDP 19 mmHg). Successful PCI and intracoronary lithotripsy to ramus intermedius with reduction of in-stent restenosis to 20-30% and reduction of de-novo lesion to 0% with TIMI-3 flow.   Recommendations: Overnight observation. Dual antiplatelet therapy with aspirin and clopidogrel for at least 12 months. Aggressive secondary prevention of coronary artery disease.   [name], MD [facility]   Coronary Diagrams   Diagnostic Dominance: Right  Intervention        Echo 07/2022 1. Left ventricular ejection fraction, by estimation, is 55 to 60%. The  left ventricle has normal function. The left ventricle has no regional  wall motion abnormalities. Left ventricular diastolic parameters are  consistent with Grade II diastolic  dysfunction (pseudonormalization). The average left ventricular global  longitudinal strain is -19.5 %. The global longitudinal strain is normal.   2. Right ventricular systolic function is normal. The right ventricular  size is normal.   3. The mitral valve is normal in structure. Mild mitral valve  regurgitation.   4. The aortic valve is tricuspid. Aortic valve regurgitation is not  visualized.   *** Risk Assessment/Calculations:   {Does this patient have ATRIAL FIBRILLATION?:[id number]} No BP recorded.  {Refresh Note OR Click here to enter BP  :1}***       Physical Exam:   VS:  There were no  vitals taken for this visit.   Wt Readings from Last 3 Encounters:  09/21/23 167 lb 3.2 oz (75.8 kg)  08/26/23 171 lb 12.8 oz (77.9 kg)  08/19/23 176 lb 14.4 oz (80.2 kg)    GEN: Well nourished, well developed in no acute distress NECK: No JVD; No carotid bruits CARDIAC: ***RRR, no murmurs, rubs, gallops RESPIRATORY:  Clear to auscultation without rales, wheezing or rhonchi  ABDOMEN: Soft, non-tender, non-distended EXTREMITIES:  No edema; No deformity   ASSESSMENT AND PLAN: .   ***    {Are you ordering a CV Procedure (e.g. stress test, cath, DCCV, TEE, etc)?   Press F2        :[id number]}  Dispo: ***  Signed, Mayvis Agudelo David Stall, PA-C

## 2023-11-18 ENCOUNTER — Telehealth (INDEPENDENT_AMBULATORY_CARE_PROVIDER_SITE_OTHER): Payer: Self-pay | Admitting: Vascular Surgery

## 2023-11-18 NOTE — Telephone Encounter (Signed)
 LVM for pt TCB and schedule appt with Dr. Vonna Guardian  CT results - see JD

## 2023-11-24 ENCOUNTER — Telehealth (INDEPENDENT_AMBULATORY_CARE_PROVIDER_SITE_OTHER): Payer: Self-pay | Admitting: Vascular Surgery

## 2023-11-24 NOTE — Telephone Encounter (Signed)
 LVM for pt TCB and schedule CT results - see JD appt.

## 2023-12-06 ENCOUNTER — Other Ambulatory Visit: Payer: Self-pay

## 2023-12-06 ENCOUNTER — Emergency Department

## 2023-12-06 ENCOUNTER — Emergency Department
Admission: EM | Admit: 2023-12-06 | Discharge: 2023-12-06 | Disposition: A | Discharge status: Home / Self Care | Attending: Emergency Medicine | Admitting: Emergency Medicine

## 2023-12-06 ENCOUNTER — Encounter: Payer: Self-pay | Admitting: Intensive Care

## 2023-12-06 DIAGNOSIS — R079 Chest pain, unspecified: Secondary | ICD-10-CM | POA: Diagnosis present

## 2023-12-06 DIAGNOSIS — R4182 Altered mental status, unspecified: Secondary | ICD-10-CM | POA: Diagnosis present

## 2023-12-06 DIAGNOSIS — Z7901 Long term (current) use of anticoagulants: Secondary | ICD-10-CM | POA: Diagnosis not present

## 2023-12-06 DIAGNOSIS — D72829 Elevated white blood cell count, unspecified: Secondary | ICD-10-CM | POA: Insufficient documentation

## 2023-12-06 DIAGNOSIS — E876 Hypokalemia: Secondary | ICD-10-CM | POA: Diagnosis not present

## 2023-12-06 LAB — PROTIME-INR
INR: 1.1 (ref 0.8–1.2)
Prothrombin Time: 13.9 s (ref 11.4–15.2)

## 2023-12-06 LAB — HEPATIC FUNCTION PANEL
ALT: 90 U/L — ABNORMAL HIGH (ref 0–44)
AST: 141 U/L — ABNORMAL HIGH (ref 15–41)
Albumin: 4.4 g/dL (ref 3.5–5.0)
Alkaline Phosphatase: 74 U/L (ref 38–126)
Bilirubin, Direct: 0.4 mg/dL — ABNORMAL HIGH (ref 0.0–0.2)
Indirect Bilirubin: 2 mg/dL — ABNORMAL HIGH (ref 0.3–0.9)
Total Bilirubin: 2.4 mg/dL — ABNORMAL HIGH (ref 0.0–1.2)
Total Protein: 8.2 g/dL — ABNORMAL HIGH (ref 6.5–8.1)

## 2023-12-06 LAB — TROPONIN I (HIGH SENSITIVITY)
Troponin I (High Sensitivity): 31 ng/L — ABNORMAL HIGH (ref <18)
Troponin I (High Sensitivity): 36 ng/L — ABNORMAL HIGH

## 2023-12-06 LAB — BASIC METABOLIC PANEL WITH GFR
Anion gap: 15 (ref 5–15)
BUN: 17 mg/dL (ref 6–20)
CO2: 18 mmol/L — ABNORMAL LOW (ref 22–32)
Calcium: 9.1 mg/dL (ref 8.9–10.3)
Chloride: 103 mmol/L (ref 98–111)
Creatinine, Ser: 0.66 mg/dL (ref 0.61–1.24)
GFR, Estimated: >60 mL/min (ref >60)
Glucose, Bld: 130 mg/dL — ABNORMAL HIGH (ref 70–99)
Potassium: 3.4 mmol/L — ABNORMAL LOW (ref 3.5–5.1)
Sodium: 136 mmol/L (ref 135–145)

## 2023-12-06 LAB — CBC
HCT: 49 % (ref 39.0–52.0)
Hemoglobin: 16.4 g/dL (ref 13.0–17.0)
MCH: 32.1 pg (ref 26.0–34.0)
MCHC: 33.5 g/dL (ref 30.0–36.0)
MCV: 95.9 fL (ref 80.0–100.0)
Platelets: 199 10*3/uL (ref 150–400)
RBC: 5.11 MIL/uL (ref 4.22–5.81)
RDW: 12.8 % (ref 11.5–15.5)
WBC: 10.9 10*3/uL — ABNORMAL HIGH (ref 4.0–10.5)
nRBC: 0 % (ref 0.0–0.2)

## 2023-12-06 LAB — LIPASE, BLOOD: Lipase: 41 U/L (ref 11–51)

## 2023-12-06 MED ORDER — LORAZEPAM 2 MG/ML IJ SOLN
1.0000 mg | Freq: Once | INTRAMUSCULAR | Status: AC
  Administered 2023-12-06: 1 mg via INTRAVENOUS
  Filled 2023-12-06: qty 1

## 2023-12-06 MED ORDER — MORPHINE SULFATE (PF) 4 MG/ML IV SOLN
4.0000 mg | Freq: Once | INTRAVENOUS | Status: AC
  Administered 2023-12-06: 4 mg via INTRAVENOUS
  Filled 2023-12-06: qty 1

## 2023-12-06 MED ORDER — IOHEXOL 350 MG/ML SOLN
100.0000 mL | Freq: Once | INTRAVENOUS | Status: AC | PRN
  Administered 2023-12-06: 100 mL via INTRAVENOUS

## 2023-12-06 MED ORDER — ONDANSETRON HCL 4 MG/2ML IJ SOLN
4.0000 mg | Freq: Once | INTRAMUSCULAR | Status: AC
  Administered 2023-12-06: 4 mg via INTRAVENOUS
  Filled 2023-12-06: qty 2
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
## 2023-12-06 NOTE — ED Notes (Signed)
 Pt went to CT

## 2023-12-06 NOTE — ED Notes (Addendum)
 Pt resting.

## 2023-12-06 NOTE — ED Triage Notes (Signed)
 Patient c/o central and left sided, sharp, chest pain. History of A-fib  Reports history of having stents placed

## 2023-12-06 NOTE — Discharge Instructions (Addendum)
 Your workup was reassuring with troponins that were reassuring, EKG however you are very high risk for heart problems and if you develop return of pain or worsening pain you should return to the ER for repeat EKG, cardiac markers.  After discussion with cardiology given your resolution of pain they felt comfortable with you following up outpatient but it is important that you return if symptoms are changing or worsening. Avoid any cocaine.   IMPRESSION: 1. No acute aortic syndrome. No acute inflammatory process identified within the chest, abdomen or pelvis. 2. There is a small focal intimal flap along the anterior left side of the infrarenal abdominal aorta, as described above, essentially unchanged since the prior study. Moderate narrowing of the right common iliac artery and left internal iliac artery at origin, as described above. Moderate to severe narrowing of the inferior mesenteric artery at origin. 3. Multiple other nonacute observations, as described above.

## 2023-12-06 NOTE — ED Notes (Signed)
 Pt taken to xray

## 2023-12-07 ENCOUNTER — Encounter (INDEPENDENT_AMBULATORY_CARE_PROVIDER_SITE_OTHER): Payer: Self-pay

## 2023-12-09 NOTE — Progress Notes (Signed)
 Cardiology Office Note  Date:  12/10/2023   ID:  Marcus [name], DOB 1965/11/27, MRN [id number]  PCP:  Pcp, No   Chief Complaint  Patient presents with   [facility] follow up; chest pain     HPI:  [name] is a 58 year old gentleman with past medical history of Polysubstance abuse in 2015, ETOH abuse, tobacco up to 1 pack of cigarettes daily, and cocaine (postive 4/18).  Coronary artery disease, ST elevation MI December 2015 of the ramus intermedius status post PCI drug-eluting stent placement well,  February 2024 PCI to distal ramus Ejection fraction 55 to 60% Who presents for follow-up of his chronic chest pain, coronary disease, PAD, carotid stent, atrial fibrillation  Last seen by myself in clinic July 2023 Last seen by one of our providers February 2025 On disability  Catheterization February 2024 Stent to distal ramus  carotid ultrasound showed 60 to 79% stenosis in the right internal carotid artery with 80 to 90% left internal carotid artery and left subclavian artery stenosis.  CTA head and neck showed severe critical stenosis with 80% PR VA in the left ICA with 50% stenosis.  The patient underwent carotid stenting 08/31/22.   In the hospital August 19, 2023 atrial fibrillation Normal echocardiogram On Eliquis  5 twice daily  PET stress test March 2025 No significant ischemia, old infarct  Seen in the emergency room Dec 02, 2023 Reported having chest pain left side Troponins essentially negative in the 30s nontrending Blood pressure elevated 180 systolic Lots of stress at home, "horrible roommates and their dogs"  CT scan May 2025 with Moderate narrowing of the right common iliac artery and left internal iliac artery at origin, Moderate to severe narrowing of the inferior mesenteric artery at origin.  Continues to smoke 1 pack/day No active chest pain  EKG personally reviewed by myself on todays visit EKG Interpretation Date/Time:  Friday Dec 10 2023  09:39:43 EDT Ventricular Rate:  60 PR Interval:  158 QRS Duration:  128 QT Interval:  436 QTC Calculation: 436 R Axis:   133  Text Interpretation: Normal sinus rhythm Right bundle branch block Lateral infarct , age undetermined When compared with ECG of 06-Dec-2023 12:26, Premature ventricular complexes are no longer Present Criteria for Septal infarct are no longer Present T wave amplitude has decreased in Lateral leads QT has shortened Confirmed by Bethany Hirt [id number]) on 12/10/2023 9:42:38 AM    Other past medical history reviewed admitted to [facility] on 12/10 after developing continuous chest pain that radiated to his back the night before with a ST elevation MI. EKG revealed borderline 1 mm of lateral ST elevation. Troponin was found to be 39.00. CT of the chest showed no aortic dissection. He was taken urgently to the cardiac cath lab that showed an occluded Ramus intermedius s/p PCI/DES Residual 50% LAD, 80% OM disease  PMH:   has a past medical history of CAD in native artery, Carotid artery disease (HCC), CHF (congestive heart failure) (HCC), Cocaine abuse (HCC), Depression, H/O gastric ulcer, Hyperglycemia, Hypertension, Ischemic cardiomyopathy, MI (myocardial infarction) (HCC), Polysubstance abuse (HCC), and Tobacco abuse.  PSH:    Past Surgical History:  Procedure Laterality Date   CARDIAC CATHETERIZATION  06/28/2014   x1 stent   CAROTID PTA/STENT INTERVENTION Right 08/31/2022   Procedure: CAROTID PTA/STENT INTERVENTION;  Surgeon: Celso College, MD;  Location: [facility] INVASIVE CV LAB;  Service: Cardiovascular;  Laterality: Right;   COLONOSCOPY N/A 08/12/2022   Procedure: COLONOSCOPY;  Surgeon: Franklin, Teodoro K,  MD;  Location: [facility] ENDOSCOPY;  Service: Gastroenterology;  Laterality: N/A;   CORONARY STENT INTERVENTION N/A 08/26/2022   Procedure: CORONARY STENT INTERVENTION;  Surgeon: Sammy Crisp, MD;  Location: [facility] INVASIVE CV LAB;  Service: Cardiovascular;  Laterality: N/A;    ESOPHAGOGASTRODUODENOSCOPY N/A 08/12/2022   Procedure: ESOPHAGOGASTRODUODENOSCOPY (EGD);  Surgeon: Toledo, Alphonsus Jeans, MD;  Location: [facility] ENDOSCOPY;  Service: Gastroenterology;  Laterality: N/A;   LEFT HEART CATH AND CORONARY ANGIOGRAPHY Left 08/26/2022   Procedure: LEFT HEART CATH AND CORONARY ANGIOGRAPHY;  Surgeon: Sammy Crisp, MD;  Location: [facility] INVASIVE CV LAB;  Service: Cardiovascular;  Laterality: Left;   NISSEN FUNDOPLICATION     stomach ulcer surgery      Current Outpatient Medications  Medication Sig Dispense Refill   apixaban  (ELIQUIS ) 5 MG TABS tablet Take 1 tablet (5 mg total) by mouth 2 (two) times daily. 60 tablet 1   clopidogrel  (PLAVIX ) 75 MG tablet Take 75 mg by mouth daily.     DULoxetine  (CYMBALTA ) 30 MG capsule Take 1 capsule (30 mg total) by mouth daily. 30 capsule 3   ezetimibe  (ZETIA ) 10 MG tablet Take 1 tablet (10 mg total) by mouth daily. 90 tablet 3   hydrOXYzine  (VISTARIL ) 25 MG capsule Take 25 mg by mouth 2 (two) times daily.     irbesartan  (AVAPRO ) 300 MG tablet Take 1 tablet (300 mg total) by mouth daily. 90 tablet 3   isosorbide  mononitrate (IMDUR ) 60 MG 24 hr tablet Take 1 tablet (60 mg total) by mouth 2 (two) times daily. 60 tablet 1   metoprolol  succinate (TOPROL -XL) 25 MG 24 hr tablet Take 3 tablets (75 mg total) by mouth daily. Take with or immediately following a meal. 90 tablet 1   mometasone -formoterol  (DULERA ) 200-5 MCG/ACT AERO Inhale 2 puffs into the lungs 2 (two) times daily. 1 each 0   Multiple Vitamin (MULTIVITAMIN WITH MINERALS) TABS tablet Take 1 tablet by mouth daily. 30 tablet 0   nicotine  (NICODERM CQ  - DOSED IN MG/24 HOURS) 21 mg/24hr patch Place 21 mg onto the skin daily as needed.     nitroGLYCERIN  (NITROSTAT ) 0.4 MG SL tablet Place 1 tablet (0.4 mg total) under the tongue every 5 (five) minutes as needed for chest pain. 25 tablet 0   pantoprazole  (PROTONIX ) 40 MG tablet Take 1 tablet (40 mg total) by mouth daily. 30 tablet 1   QUEtiapine   (SEROQUEL ) 200 MG tablet Take 1 tablet (200 mg total) by mouth at bedtime. 30 tablet 1   rosuvastatin  (CRESTOR ) 40 MG tablet Take 1 tablet (40 mg total) by mouth daily. 30 tablet 1   SPIRIVA  RESPIMAT 1.25 MCG/ACT AERS Inhale 2 sprays into the lungs daily. 4 g 1   VENTOLIN  HFA 108 (90 Base) MCG/ACT inhaler Inhale 1-2 puffs into the lungs every 4 (four) hours as needed for shortness of breath or wheezing. SMARTSIG:2 Puff(s) By Mouth Every 4 Hours PRN 8 g 1   No current facility-administered medications for this visit.   Allergies:   Oxycodone-acetaminophen  and Codeine   Social History:  The patient  reports that he has been smoking cigarettes. He started smoking about 48 years ago. He has a 48.4 pack-year smoking history. He has never used smokeless tobacco. He reports current alcohol use of about 12.0 standard drinks of alcohol per week. He reports that he does not use drugs.   Family History:   family history includes Heart attack in his brother; Heart failure in his father and mother; Prostate cancer in his brother;  Stroke in his father.   Review of Systems: Review of Systems  Constitutional: Negative.   HENT: Negative.    Respiratory: Negative.    Cardiovascular: Negative.   Gastrointestinal: Negative.   Musculoskeletal: Negative.   Neurological: Negative.   Psychiatric/Behavioral: Negative.    All other systems reviewed and are negative.   PHYSICAL EXAM: VS:  There were no vitals taken for this visit. , BMI There is no height or weight on file to calculate BMI. Constitutional:  oriented to person, place, and time. No distress.  HENT:  Head: Grossly normal Eyes:  no discharge. No scleral icterus.  Neck: No JVD, no carotid bruits  Cardiovascular: Regular rate and rhythm, no murmurs appreciated Pulmonary/Chest: Clear to auscultation bilaterally, no wheezes or rales Abdominal: Soft.  no distension.  no tenderness.  Musculoskeletal: Normal range of motion Neurological:  normal  muscle tone. Coordination normal. No atrophy Skin: Skin warm and dry Psychiatric: normal affect, pleasant  Recent Labs: 08/19/2023: B Natriuretic Peptide 47.6; TSH 1.216 08/20/2023: Magnesium  1.9 12/06/2023: ALT 90; BUN 17; Creatinine, Ser 0.66; Hemoglobin 16.4; Platelets 199; Potassium 3.4; Sodium 136    Lipid Panel Lab Results  Component Value Date   CHOL 189 08/20/2023   HDL 83 08/20/2023   LDLCALC 78 08/20/2023   TRIG 141 08/20/2023    Wt Readings from Last 3 Encounters:  12/06/23 160 lb (72.6 kg)  09/21/23 167 lb 3.2 oz (75.8 kg)  08/26/23 171 lb 12.8 oz (77.9 kg)    ASSESSMENT AND PLAN:  Problem List Items Addressed This Visit       Cardiology Problems   Ischemic cardiomyopathy   Relevant Orders   EKG 12-Lead   HFrEF (heart failure with reduced ejection fraction) (HCC)   Relevant Orders   EKG 12-Lead   Coronary artery disease   Relevant Orders   EKG 12-Lead     Other   Chest pain   Relevant Orders   EKG 12-Lead   Polysubstance abuse (HCC)   Other Visit Diagnoses       Paroxysmal atrial fibrillation (HCC)    -  Primary   Relevant Orders   EKG 12-Lead     Essential hypertension       Relevant Orders   EKG 12-Lead     Hyperlipidemia, mixed         Tobacco use         Alcohol use          Coronary disease with stable angina Currently with no symptoms of angina. No further workup at this time. Continue current medication regimen.  Recent evaluation in the emergency room in the setting of severe stress Periodically calling the police on his roommates, unable to evict them due to financial issues Recent ischemic workup March 2025 with no significant ischemia  Essential hypertension  No changes made to the medications. Difficulty affording some of his medications, scheduled to receive his disability check third of every month  Hyperlipidemia Recommend he stay on his Crestor  40 daily and Zetia   Smoker We have encouraged him to continue to work on  weaning his cigarettes and smoking cessation. He will continue to work on this and does not want any assistance with chantix.     Signed, Juanda Noon, M.D., Ph.D. [facility] [place], [place] [phone]

## 2023-12-10 ENCOUNTER — Other Ambulatory Visit: Payer: Self-pay | Admitting: Cardiovascular Disease

## 2023-12-10 ENCOUNTER — Ambulatory Visit: Attending: Cardiovascular Disease | Admitting: Cardiovascular Disease

## 2023-12-10 ENCOUNTER — Encounter: Payer: Self-pay | Admitting: Cardiovascular Disease

## 2023-12-10 VITALS — BP 140/82 | HR 60 | Ht 63.0 in | Wt 158.1 lb

## 2023-12-10 DIAGNOSIS — E782 Mixed hyperlipidemia: Secondary | ICD-10-CM | POA: Insufficient documentation

## 2023-12-10 DIAGNOSIS — F1090 Alcohol use, unspecified, uncomplicated: Secondary | ICD-10-CM | POA: Diagnosis present

## 2023-12-10 DIAGNOSIS — R079 Chest pain, unspecified: Secondary | ICD-10-CM | POA: Insufficient documentation

## 2023-12-10 DIAGNOSIS — I25118 Atherosclerotic heart disease of native coronary artery with other forms of angina pectoris: Secondary | ICD-10-CM | POA: Diagnosis present

## 2023-12-10 DIAGNOSIS — F1910 Other psychoactive substance abuse, uncomplicated: Secondary | ICD-10-CM | POA: Diagnosis present

## 2023-12-10 DIAGNOSIS — I10 Essential (primary) hypertension: Secondary | ICD-10-CM | POA: Diagnosis present

## 2023-12-10 DIAGNOSIS — I255 Ischemic cardiomyopathy: Secondary | ICD-10-CM | POA: Insufficient documentation

## 2023-12-10 DIAGNOSIS — I480 Paroxysmal atrial fibrillation: Secondary | ICD-10-CM | POA: Insufficient documentation

## 2023-12-10 DIAGNOSIS — Z720 Tobacco use: Secondary | ICD-10-CM | POA: Insufficient documentation

## 2023-12-10 DIAGNOSIS — I5020 Unspecified systolic (congestive) heart failure: Secondary | ICD-10-CM | POA: Diagnosis present

## 2023-12-10 MED ORDER — APIXABAN 5 MG PO TABS: 5.0000 mg | ORAL_TABLET | Freq: Two times a day (BID) | ORAL | 1 refills | Status: DC

## 2023-12-10 MED ORDER — ROSUVASTATIN CALCIUM 40 MG PO TABS: 40.0000 mg | ORAL_TABLET | Freq: Every day | ORAL | 3 refills | Status: DC

## 2023-12-10 MED ORDER — IRBESARTAN 300 MG PO TABS: 300.0000 mg | ORAL_TABLET | Freq: Every day | ORAL | 3 refills | Status: DC

## 2023-12-10 MED ORDER — METOPROLOL SUCCINATE ER 25 MG PO TB24: 75.0000 mg | ORAL_TABLET | Freq: Every day | ORAL | 3 refills | Status: DC

## 2023-12-10 MED ORDER — ISOSORBIDE MONONITRATE ER 60 MG PO TB24: 60.0000 mg | ORAL_TABLET | Freq: Two times a day (BID) | ORAL | 3 refills | Status: AC

## 2023-12-10 NOTE — Patient Instructions (Signed)

## 2023-12-17 ENCOUNTER — Ambulatory Visit (INDEPENDENT_AMBULATORY_CARE_PROVIDER_SITE_OTHER): Admitting: Vascular Surgery

## 2023-12-17 ENCOUNTER — Encounter (INDEPENDENT_AMBULATORY_CARE_PROVIDER_SITE_OTHER): Payer: Self-pay | Admitting: Vascular Surgery

## 2023-12-17 VITALS — BP 189/101 | HR 70 | Resp 18 | Ht 63.0 in | Wt 163.4 lb

## 2023-12-17 DIAGNOSIS — I700 Atherosclerosis of aorta: Secondary | ICD-10-CM | POA: Diagnosis not present

## 2023-12-17 DIAGNOSIS — I10 Essential (primary) hypertension: Secondary | ICD-10-CM | POA: Diagnosis not present

## 2023-12-17 DIAGNOSIS — E782 Mixed hyperlipidemia: Secondary | ICD-10-CM

## 2023-12-17 DIAGNOSIS — I6521 Occlusion and stenosis of right carotid artery: Secondary | ICD-10-CM

## 2023-12-17 DIAGNOSIS — I255 Ischemic cardiomyopathy: Secondary | ICD-10-CM

## 2023-12-17 DIAGNOSIS — Z720 Tobacco use: Secondary | ICD-10-CM

## 2023-12-17 DIAGNOSIS — F1910 Other psychoactive substance abuse, uncomplicated: Secondary | ICD-10-CM

## 2023-12-17 NOTE — Assessment & Plan Note (Signed)
 As for his carotid disease, he is 1 year status post right carotid stent placement.  This was patent both on his recent duplex and the CT scan.  He had a duplex done at the hospital about 2 months ago which showed greater than 70% left ICA stenosis.  CT angiogram was officially reported as a 60% left ICA stenosis with a patent right carotid stent.  I think this is significantly under estimated by my review.  It would clearly appear to be at least a 75 to 80% stenosis. Given the duplex findings and my evaluation of the CT scan, I think a carotid angiogram would be prudent at this point for further evaluation of the disease.  If this is indeed a greater than 70% stenosis, carotid stent placement would be planned at that time as long as the anatomy is amenable.  He has significant cardiomyopathy and avoidance of general anesthesia would be preferable for treatment of his carotid disease as long as the anatomy is not prohibitive for carotid stenting.  I have discussed the risks and benefits of the procedure.  Patient is on Plavix  and Eliquis  as well as a statin agent and should continue these.

## 2023-12-17 NOTE — Progress Notes (Signed)
 MRN : [id number]  Marcus [name] is a 58 y.o. (Feb 06, 1966) male who presents with chief complaint of  Chief Complaint  Patient presents with   Follow-up    Ct results  .  History of Present Illness: Patient returns today in follow up of his carotid disease as well as a recent CT scan of his chest abdomen and pelvis.  He had a CT scan of the neck as well.  I have reviewed both scans.  He had some aortic atherosclerosis and mild iliac artery narrowing but this was stable from her previous scan and certainly not a critical situation.  He is on appropriate medical therapy for this. As for his carotid disease, he is 1 year status post right carotid stent placement.  This was patent both on his recent duplex and the CT scan.  He had a duplex done at the hospital about 2 months ago which showed greater than 70% left ICA stenosis.  CT angiogram was officially reported as a 60% left ICA stenosis with a patent right carotid stent.  I think this is significantly under estimated by my review.  It would clearly appear to be at least a 75 to 80% stenosis.  Current Outpatient Medications  Medication Sig Dispense Refill   apixaban  (ELIQUIS ) 5 MG TABS tablet Take 1 tablet (5 mg total) by mouth 2 (two) times daily. 180 tablet 1   clopidogrel  (PLAVIX ) 75 MG tablet Take 75 mg by mouth daily.     DULoxetine  (CYMBALTA ) 30 MG capsule Take 1 capsule (30 mg total) by mouth daily. 30 capsule 3   hydrOXYzine  (VISTARIL ) 25 MG capsule Take 25 mg by mouth 2 (two) times daily.     irbesartan  (AVAPRO ) 300 MG tablet Take 1 tablet (300 mg total) by mouth daily. 90 tablet 3   isosorbide  mononitrate (IMDUR ) 60 MG 24 hr tablet Take 1 tablet (60 mg total) by mouth 2 (two) times daily. 180 tablet 3   metoprolol  succinate (TOPROL -XL) 25 MG 24 hr tablet Take 3 tablets (75 mg total) by mouth daily. Take with or immediately following a meal. 90 tablet 3   mometasone -formoterol  (DULERA ) 200-5 MCG/ACT AERO Inhale 2 puffs into the  lungs 2 (two) times daily. 1 each 0   Multiple Vitamin (MULTIVITAMIN WITH MINERALS) TABS tablet Take 1 tablet by mouth daily. 30 tablet 0   nicotine  (NICODERM CQ  - DOSED IN MG/24 HOURS) 21 mg/24hr patch Place 21 mg onto the skin daily as needed.     nitroGLYCERIN  (NITROSTAT ) 0.4 MG SL tablet Place 1 tablet (0.4 mg total) under the tongue every 5 (five) minutes as needed for chest pain. 25 tablet 0   pantoprazole  (PROTONIX ) 40 MG tablet Take 1 tablet (40 mg total) by mouth daily. 30 tablet 1   QUEtiapine  (SEROQUEL ) 200 MG tablet Take 1 tablet (200 mg total) by mouth at bedtime. 30 tablet 1   rosuvastatin  (CRESTOR ) 40 MG tablet Take 1 tablet (40 mg total) by mouth daily. 90 tablet 3   SPIRIVA  RESPIMAT 1.25 MCG/ACT AERS Inhale 2 sprays into the lungs daily. 4 g 1   VENTOLIN  HFA 108 (90 Base) MCG/ACT inhaler Inhale 1-2 puffs into the lungs every 4 (four) hours as needed for shortness of breath or wheezing. SMARTSIG:2 Puff(s) By Mouth Every 4 Hours PRN 8 g 1   ezetimibe  (ZETIA ) 10 MG tablet Take 1 tablet (10 mg total) by mouth daily. 90 tablet 3   No current facility-administered medications for this visit.  Past Medical History:  Diagnosis Date   CAD in native artery    a. 06/28/2014: STEMI, cath LM nl, mLAD 50%, mLCx 20%, OM1 small in size 80%, ramus 100% s/p PCI/DES 0%, EF 40%    Carotid artery disease (HCC)    CHF (congestive heart failure) (HCC)    Cocaine abuse (HCC)    Depression    H/O gastric ulcer    Hyperglycemia    a. A1C 5.6%   Hypertension    Ischemic cardiomyopathy    a. EF 40% by cath 06/28/2014   MI (myocardial infarction) (HCC)    Polysubstance abuse (HCC)    a. cocaine, tobacco, and alcohol    Tobacco abuse     Past Surgical History:  Procedure Laterality Date   CARDIAC CATHETERIZATION  06/28/2014   x1 stent   CAROTID PTA/STENT INTERVENTION Right 08/31/2022   Procedure: CAROTID PTA/STENT INTERVENTION;  Surgeon: Celso College, MD;  Location: [facility];   Service: Cardiovascular;  Laterality: Right;   COLONOSCOPY N/A 08/12/2022   Procedure: COLONOSCOPY;  Surgeon: Toledo, Alphonsus Jeans, MD;  Location: [facility];  Service: Gastroenterology;  Laterality: N/A;   CORONARY STENT INTERVENTION N/A 08/26/2022   Procedure: CORONARY STENT INTERVENTION;  Surgeon: Sammy Crisp, MD;  Location: [facility];  Service: Cardiovascular;  Laterality: N/A;   ESOPHAGOGASTRODUODENOSCOPY N/A 08/12/2022   Procedure: ESOPHAGOGASTRODUODENOSCOPY (EGD);  Surgeon: Toledo, Alphonsus Jeans, MD;  Location: [facility] END[facility];  Service: Gastroenterology;  Laterality: N/A;   LEFT HEART CATH AND CORONARY ANGIOGRAPHY Left 08/26/2022   Procedure: LEFT HEART CATH AND CORONARY ANGIOGRAPHY;  Surgeon: Sammy Crisp, MD;  Location: [facility];  Service: Cardiovascular;  Laterality: Left;   NISSEN FUNDOPLICATION     stomach ulcer surgery       Social History   Tobacco Use   Smoking status: Every Day    Current packs/day: 1.00    Average packs/day: 1 pack/day for 48.4 years (48.4 ttl pk-yrs)    Types: Cigarettes    Start date: 07/21/1975   Smokeless tobacco: Never   Tobacco comments:    Pt. Is actively trying to quit and is wearing the patch.   Vaping Use   Vaping status: Never Used  Substance Use Topics   Alcohol use: Yes    Alcohol/week: 12.0 standard drinks of alcohol    Types: 12 Cans of beer per week   Drug use: No    Types: Cocaine, Marijuana    Comment: none in at least 2 years      Family History  Problem Relation Age of Onset   Heart failure Mother    Stroke Father    Heart failure Father    Prostate cancer Brother    Heart attack Brother      Allergies  Allergen Reactions   Oxycodone-Acetaminophen  Other (See Comments)   Codeine Rash     REVIEW OF SYSTEMS (Negative unless checked)   Constitutional: [] Weight loss  [] Fever  [] Chills Cardiac: [] Chest pain   [] Chest pressure   [] Palpitations   [] Shortness of breath when laying flat    [] Shortness of breath at rest   [x] Shortness of breath with exertion. Vascular:  [] Pain in legs with walking   [] Pain in legs at rest   [] Pain in legs when laying flat   [] Claudication   [] Pain in feet when walking  [] Pain in feet at rest  [] Pain in feet when laying flat   [] History of DVT   [] Phlebitis   [] Swelling in  legs   [] Varicose veins   [] Non-healing ulcers Pulmonary:   [] Uses home oxygen   [] Productive cough   [] Hemoptysis   [] Wheeze  [] COPD   [] Asthma Neurologic:  [] Dizziness  [x] Blackouts   [] Seizures   [] History of stroke   [] History of TIA  [] Aphasia   [] Temporary blindness   [] Dysphagia   [] Weakness or numbness in arms   [] Weakness or numbness in legs Musculoskeletal:  [x] Arthritis   [] Joint swelling   [] Joint pain   [] Low back pain Hematologic:  [] Easy bruising  [] Easy bleeding   [] Hypercoagulable state   [] Anemic  [] Hepatitis Gastrointestinal:  [] Blood in stool   [] Vomiting blood  [] Gastroesophageal reflux/heartburn   [] Abdominal pain Genitourinary:  [] Chronic kidney disease   [] Difficult urination  [] Frequent urination  [] Burning with urination   [] Hematuria Skin:  [] Rashes   [] Ulcers   [] Wounds Psychological:  [] History of anxiety   [x]  History of major depression.  Physical Examination  BP (!) 189/101   Pulse 70   Resp 18   Ht 5\' 3"  (1.6 m)   Wt 163 lb 6.4 oz (74.1 kg)   BMI 28.95 kg/m  Gen:  WD/WN, NAD. Appears older than stated age. Head: [place]/AT, No temporalis wasting. Ear/Nose/Throat: Hearing grossly intact, nares w/o erythema or drainage Eyes: Conjunctiva clear. Sclera non-icteric Neck: Supple.  Trachea midline Pulmonary:  Good air movement, no use of accessory muscles.  Cardiac: RRR, no JVD Vascular:  Vessel Right Left  Radial Palpable Palpable       Musculoskeletal: M/S 5/5 throughout.  No deformity or atrophy. No edema. Neurologic: Sensation grossly intact in extremities.  Symmetrical.  Speech is fluent.  Psychiatric: Judgment intact, Mood & affect  appropriate for pt's clinical situation. Dermatologic: No rashes or ulcers noted.  No cellulitis or open wounds.      Labs Recent Results (from the past 2160 hours)  NM PET CT CARDIAC PERFUSION MULTI W/ABSOLUTE BLOODFLOW     Status: None   Collection Time: 10/07/23  9:28 AM  Result Value Ref Range   Rest Nuclear Isotope Dose 19.7 mCi   Stress Nuclear Isotope Dose 19.7 mCi   Rest HR 66.0 bpm   Rest BP 154/59 mmHg   Peak HR 93 bpm   Peak BP 160/64 mmHg   SSS 15.0    SRS 11.0    TID 1.10    Nuc Stress EF 49 %   Nuc Rest EF 46 %   ST Depression (mm) 0 mm   LV sys vol 62.0 mL   LV dias vol 122.0 62 - 150 mL   Rest MBF 0.71 ml/g/min   Stress MBF 1.45 ml/g/min   MBFR 2.04   I-STAT creatinine     Status: None   Collection Time: 10/14/23  9:21 AM  Result Value Ref Range   Creatinine, Ser 1.10 0.61 - 1.24 mg/dL  Basic metabolic panel     Status: Abnormal   Collection Time: 12/06/23  8:25 AM  Result Value Ref Range   Sodium 136 135 - 145 mmol/L   Potassium 3.4 (L) 3.5 - 5.1 mmol/L   Chloride 103 98 - 111 mmol/L   CO2 18 (L) 22 - 32 mmol/L   Glucose, Bld 130 (H) 70 - 99 mg/dL    Comment: Glucose reference range applies only to samples taken after fasting for at least 8 hours.   BUN 17 6 - 20 mg/dL   Creatinine, Ser 1.61 0.61 - 1.24 mg/dL   Calcium  9.1 8.9 - 10.3 mg/dL  GFR, Estimated >60 >60 mL/min    Comment: (NOTE) Calculated using the CKD-EPI Creatinine Equation (2021)    Anion gap 15 5 - 15    Comment: Performed at [facility], [address] [place], [place] [address]  CBC     Status: Abnormal   Collection Time: 12/06/23  8:25 AM  Result Value Ref Range   WBC 10.9 (H) 4.0 - 10.5 K/uL   RBC 5.11 4.22 - 5.81 MIL/uL   Hemoglobin 16.4 13.0 - 17.0 g/dL   HCT 44.0 10.2 - 72.5 %   MCV 95.9 80.0 - 100.0 fL   MCH 32.1 26.0 - 34.0 pg   MCHC 33.5 30.0 - 36.0 g/dL   RDW 36.6 44.0 - 34.7 %   Platelets 199 150 - 400 K/uL   nRBC 0.0 0.0 - 0.2 %    Comment:  Performed at [facility], [address], [place], [place] [address]  Troponin I (High Sensitivity)     Status: Abnormal   Collection Time: 12/06/23  8:25 AM  Result Value Ref Range   Troponin I (High Sensitivity) 31 (H) <18 ng/L    Comment: (NOTE) Elevated high sensitivity troponin I (hsTnI) values and significant  changes across serial measurements may suggest ACS but many other  chronic and acute conditions are known to elevate hsTnI results.  Refer to the "Links" section for chest pain algorithms and additional  guidance. Performed at [facility], [address] [place], [place] [address]   Protime-INR (order if Patient is taking Coumadin / Warfarin)     Status: None   Collection Time: 12/06/23  8:25 AM  Result Value Ref Range   Prothrombin Time 13.9 11.4 - 15.2 seconds   INR 1.1 0.8 - 1.2    Comment: (NOTE) INR goal varies based on device and disease states. Performed at [facility], [address] [place], [place] [address]   Hepatic function panel     Status: Abnormal   Collection Time: 12/06/23  8:30 AM  Result Value Ref Range   Total Protein 8.2 (H) 6.5 - 8.1 g/dL   Albumin 4.4 3.5 - 5.0 g/dL   AST 951 (H) 15 - 41 U/L   ALT 90 (H) 0 - 44 U/L   Alkaline Phosphatase 74 38 - 126 U/L   Total Bilirubin 2.4 (H) 0.0 - 1.2 mg/dL   Bilirubin, Direct 0.4 (H) 0.0 - 0.2 mg/dL   Indirect Bilirubin 2.0 (H) 0.3 - 0.9 mg/dL    Comment: Performed at [facility], [address] [place], [place] [address]  Lipase, blood     Status: None   Collection Time: 12/06/23  8:30 AM  Result Value Ref Range   Lipase 41 11 - 51 U/L    Comment: Performed at [facility], [address], [place], [place] [address]  Troponin I (High Sensitivity)     Status: Abnormal   Collection Time: 12/06/23 10:25 AM  Result Value Ref Range   Troponin I (High Sensitivity) 36 (H) <18 ng/L    Comment: (NOTE) Elevated high sensitivity troponin I (hsTnI)  values and significant  changes across serial measurements may suggest ACS but many other  chronic and acute conditions are known to elevate hsTnI results.  Refer to the "Links" section for chest pain algorithms and additional  guidance. Performed at [facility], [address], [place], [place] [address]     Radiology CT HEAD WO CONTRAST ( ) Result Date: 12/06/2023 CLINICAL  DATA:  Head trauma, abnormal mental status (Age 82-64y); Ataxia, cervical trauma. EXAM: CT HEAD WITHOUT CONTRAST CT CERVICAL SPINE WITHOUT CONTRAST TECHNIQUE: Multidetector CT imaging of the head and cervical spine was performed following the standard protocol without intravenous contrast. Multiplanar CT image reconstructions of the cervical spine were also generated. RADIATION DOSE REDUCTION: This exam was performed according to the departmental dose-optimization program which includes automated exposure control, adjustment of the mA and/or kV according to patient size and/or use of iterative reconstruction technique. COMPARISON:  CT angiography neck from 10/14/2023 and CT scan head from 11/17/2009. FINDINGS: CT HEAD FINDINGS Brain: No evidence of acute infarction, hemorrhage, hydrocephalus, extra-axial collection or mass lesion/mass effect. There is bilateral periventricular hypodensity, which is non-specific but most likely seen in the settings of microvascular ischemic changes. Mild in extent. Otherwise normal appearance of brain parenchyma. Ventricles are prominent but cerebral volume is age appropriate. Vascular: No hyperdense vessel or unexpected calcification. Intracranial arteriosclerosis. Skull: Normal. Negative for fracture or focal lesion. Sinuses/Orbits: Moderate mucoperiosteal thickening noted in the bilateral frontal sinus, bilateral frontoethmoidal recess, bilateral chambers of sphenoid sinus and bilateral ethmoidal air cells. No air-fluid levels to suggest acute sinusitis. Other: Visualized mastoid air  cells are unremarkable. No mastoid effusion. CT CERVICAL SPINE FINDINGS Alignment: There is reversal of cervical lordosis, which may be positional or due to muscle spasm. This examination does not assess for ligamentous injury or stability. Skull base and vertebrae: No acute fracture. No primary bone lesion or focal pathologic process. Soft tissues and spinal canal: No prevertebral fluid or swelling. No visible canal hematoma. Disc levels: Mild-to-moderate multilevel degenerative changes characterized by reduced intervertebral disc height, most pronounced at C6-7 level, and mild multilevel marginal osteophyte formation. Upper chest: Negative. Other: Vascular stent noted in the proximal portion of right internal carotid artery. IMPRESSION: 1. No acute intracranial abnormality. 2. No acute osseous injury or traumatic listhesis of the cervical spine. 3. Other nonacute observations, as described above. Electronically Signed   By: Beula Brunswick M.D.   On: 12/06/2023 10:52   CT Cervical Spine Wo Contrast Result Date: 12/06/2023 CLINICAL DATA:  Head trauma, abnormal mental status (Age 43-64y); Ataxia, cervical trauma. EXAM: CT HEAD WITHOUT CONTRAST CT CERVICAL SPINE WITHOUT CONTRAST TECHNIQUE: Multidetector CT imaging of the head and cervical spine was performed following the standard protocol without intravenous contrast. Multiplanar CT image reconstructions of the cervical spine were also generated. RADIATION DOSE REDUCTION: This exam was performed according to the departmental dose-optimization program which includes automated exposure control, adjustment of the mA and/or kV according to patient size and/or use of iterative reconstruction technique. COMPARISON:  CT angiography neck from 10/14/2023 and CT scan head from 11/17/2009. FINDINGS: CT HEAD FINDINGS Brain: No evidence of acute infarction, hemorrhage, hydrocephalus, extra-axial collection or mass lesion/mass effect. There is bilateral periventricular  hypodensity, which is non-specific but most likely seen in the settings of microvascular ischemic changes. Mild in extent. Otherwise normal appearance of brain parenchyma. Ventricles are prominent but cerebral volume is age appropriate. Vascular: No hyperdense vessel or unexpected calcification. Intracranial arteriosclerosis. Skull: Normal. Negative for fracture or focal lesion. Sinuses/Orbits: Moderate mucoperiosteal thickening noted in the bilateral frontal sinus, bilateral frontoethmoidal recess, bilateral chambers of sphenoid sinus and bilateral ethmoidal air cells. No air-fluid levels to suggest acute sinusitis. Other: Visualized mastoid air cells are unremarkable. No mastoid effusion. CT CERVICAL SPINE FINDINGS Alignment: There is reversal of cervical lordosis, which may be positional or due to muscle spasm. This examination does not assess for ligamentous injury  or stability. Skull base and vertebrae: No acute fracture. No primary bone lesion or focal pathologic process. Soft tissues and spinal canal: No prevertebral fluid or swelling. No visible canal hematoma. Disc levels: Mild-to-moderate multilevel degenerative changes characterized by reduced intervertebral disc height, most pronounced at C6-7 level, and mild multilevel marginal osteophyte formation. Upper chest: Negative. Other: Vascular stent noted in the proximal portion of right internal carotid artery. IMPRESSION: 1. No acute intracranial abnormality. 2. No acute osseous injury or traumatic listhesis of the cervical spine. 3. Other nonacute observations, as described above. Electronically Signed   By: Beula Brunswick M.D.   On: 12/06/2023 10:52   CT Angio Chest/Abd/Pel for Dissection W and/or Wo Contrast Result Date: 12/06/2023 CLINICAL DATA:  Acute aortic syndrome (AAS) suspected. Central and left-sided sharp chest pain. EXAM: CT ANGIOGRAPHY CHEST, ABDOMEN AND PELVIS TECHNIQUE: Non-contrast CT of the chest was initially obtained. Multidetector CT  imaging through the chest, abdomen and pelvis was performed using the standard protocol during bolus administration of intravenous contrast. Multiplanar reconstructed images and MIPs were obtained and reviewed to evaluate the vascular anatomy. RADIATION DOSE REDUCTION: This exam was performed according to the departmental dose-optimization program which includes automated exposure control, adjustment of the mA and/or kV according to patient size and/or use of iterative reconstruction technique. CONTRAST:  OMNIPAQUE  IOHEXOL  350 MG/ML SOLN COMPARISON:  CT Angiograph chest, abdomen and pelvis from 08/19/2023. FINDINGS: CTA CHEST FINDINGS Cardiovascular: No acute intramural hematoma noted in the thoracic aorta on the unenhanced images. Thoracic aorta is normal in caliber without aneurysm, dissection, vasculitis or significant stenosis. Normal cardiac size. No pericardial effusion. No aortic aneurysm. There are coronary artery calcifications, in keeping with coronary artery disease. There are also mild peripheral atherosclerotic vascular calcifications of thoracic aorta and its major branches. There is satisfactory opacification of bilateral pulmonary arteries. No embolism seen up to the proximal subsegmental pulmonary artery level. Mediastinum/Nodes: Visualized thyroid gland appears grossly unremarkable. No solid / cystic mediastinal masses. The esophagus is nondistended precluding optimal assessment. No axillary, mediastinal or hilar lymphadenopathy by size criteria. Lungs/Pleura: The central tracheo-bronchial tree is patent. There are dependent changes in bilateral lungs. No mass or consolidation. No pleural effusion or pneumothorax. No suspicious lung nodules. Musculoskeletal: The visualized soft tissues of the chest wall are grossly unremarkable. No suspicious osseous lesions. There are mild multilevel degenerative changes in the visualized spine. Review of the MIP images confirms the above findings. CTA  ABDOMEN AND PELVIS FINDINGS VASCULAR Aorta: There is a small focal intimal flap noted along the anterior left side of the infrarenal abdominal aorta (series 7, image 158 and series 10, image 70), extending from 12 to 4 o'clock position, without associated aneurysm. Appearance is essentially unchanged since the prior study. The abdominal aorta is otherwise normal in caliber and without aneurysm, vasculitis or significant stenosis. Celiac: Patent without evidence of aneurysm, dissection, vasculitis or significant stenosis. SMA: Patent without evidence of aneurysm, dissection, vasculitis or significant stenosis. Renals: Both renal arteries are patent without evidence of aneurysm, dissection, vasculitis, fibromuscular dysplasia or significant stenosis. IMA: There is moderate to severe narrowing at the origin. The artery is otherwise patent without evidence of aneurysm, dissection, or vasculitis. Inflow: Moderate narrowing at the origin of right common iliac artery due to predominantly noncalcified plaque, grossly similar to the prior study. There is moderate narrowing of the left internal iliac artery at the level of origin, grossly similar to the prior study. The arteries are otherwise patent without evidence of aneurysm, dissection, vasculitis  or significant stenosis. Veins: No obvious venous abnormality within the limitations of this arterial phase study. Review of the MIP images confirms the above findings. NON-VASCULAR Hepatobiliary: The liver is normal in size. Non-cirrhotic configuration. No suspicious mass. No intrahepatic or extrahepatic bile duct dilation. No calcified gallstones. Normal gallbladder wall thickness. No pericholecystic inflammatory changes. Pancreas: Unremarkable. No pancreatic ductal dilatation or surrounding inflammatory changes. Spleen: Within normal limits. No focal lesion. Adrenals/Urinary Tract: Adrenal glands are unremarkable. No suspicious renal mass. No hydronephrosis. No renal or  ureteric calculi. Unremarkable urinary bladder. Stomach/Bowel: No disproportionate dilation of the small or large bowel loops. No evidence of abnormal bowel wall thickening or inflammatory changes. The appendix is unremarkable. There are multiple diverticula mainly in the left hemi colon, without imaging signs of diverticulitis. Vascular/Lymphatic: No ascites or pneumoperitoneum. No abdominal or pelvic lymphadenopathy, by size criteria. There are mild peripheral atherosclerotic vascular calcifications of the aorta and its major branches. Reproductive: Normal size prostate. Symmetric seminal vesicles. Other: There is a small fat containing right periumbilical hernia. There are bilateral small fat containing inguinal hernias. The soft tissues and abdominal wall are otherwise unremarkable. Musculoskeletal: No suspicious osseous lesions. There are mild - moderate multilevel degenerative changes in the visualized spine. Review of the MIP images confirms the above findings. IMPRESSION: 1. No acute aortic syndrome. No acute inflammatory process identified within the chest, abdomen or pelvis. 2. There is a small focal intimal flap along the anterior left side of the infrarenal abdominal aorta, as described above, essentially unchanged since the prior study. Moderate narrowing of the right common iliac artery and left internal iliac artery at origin, as described above. Moderate to severe narrowing of the inferior mesenteric artery at origin. 3. Multiple other nonacute observations, as described above. Aortic Atherosclerosis (ICD10-I70.0). Electronically Signed   By: Beula Brunswick M.D.   On: 12/06/2023 10:44   DG Chest 2 View Result Date: 12/06/2023 CLINICAL DATA:  58 year old male with chest pain EXAM: CHEST - 2 VIEW COMPARISON:  11/09/2022 FINDINGS: Cardiomediastinal silhouette unchanged in size and contour. No evidence of central vascular congestion. No interlobular septal thickening. No pneumothorax or pleural  effusion. Coarsened interstitial markings, with no confluent airspace disease. No acute displaced fracture. Degenerative changes of the spine. IMPRESSION: No active cardiopulmonary disease. Electronically Signed   By: Myrlene Asper D.O.   On: 12/06/2023 09:36    Assessment/Plan  Hypertension blood pressure control important in reducing the progression of atherosclerotic disease. On appropriate oral medications.   Hyperlipidemia lipid control important in reducing the progression of atherosclerotic disease. Continue statin therapy   Polysubstance abuse (HCC) Particularly stimulants such as cocaine or methamphetamines significantly increase the risk of vascular disease.  Tobacco abuse Increases risk of atherosclerotic vascular disease and cessation would be of benefit.  Aortic atherosclerosis (HCC) He had some aortic atherosclerosis and mild iliac artery narrowing but this was stable from her previous scan and certainly not a critical situation.  He is on appropriate medical therapy for this.  I would continue this medical therapy.  We can check ABIs annually going forward unless he develops worrisome PAD symptoms.  Ischemic cardiomyopathy Carotid stent would be preferable to carotid endarterectomy to avoid general anesthesia secondary to his severe heart disease.  Carotid stenosis As for his carotid disease, he is 1 year status post right carotid stent placement.  This was patent both on his recent duplex and the CT scan.  He had a duplex done at the hospital about 2 months ago which showed  greater than 70% left ICA stenosis.  CT angiogram was officially reported as a 60% left ICA stenosis with a patent right carotid stent.  I think this is significantly under estimated by my review.  It would clearly appear to be at least a 75 to 80% stenosis. Given the duplex findings and my evaluation of the CT scan, I think a carotid angiogram would be prudent at this point for further evaluation of the  disease.  If this is indeed a greater than 70% stenosis, carotid stent placement would be planned at that time as long as the anatomy is amenable.  He has significant cardiomyopathy and avoidance of general anesthesia would be preferable for treatment of his carotid disease as long as the anatomy is not prohibitive for carotid stenting.  I have discussed the risks and benefits of the procedure.  Patient is on Plavix  and Eliquis  as well as a statin agent and should continue these.    Mikki Alexander, MD  12/17/2023 2:12 PM    This note was created with Dragon medical transcription system.  Any errors from dictation are purely unintentional

## 2023-12-17 NOTE — Assessment & Plan Note (Signed)
 Carotid stent would be preferable to carotid endarterectomy to avoid general anesthesia secondary to his severe heart disease.

## 2023-12-17 NOTE — Assessment & Plan Note (Signed)
 lipid control important in reducing the progression of atherosclerotic disease. Continue statin therapy

## 2023-12-17 NOTE — H&P (View-Only) (Signed)
 MRN : [id number]  Marcus [name] is a 58 y.o. (Feb 06, 1966) male who presents with chief complaint of  Chief Complaint  Patient presents with   Follow-up    Ct results  .  History of Present Illness: Patient returns today in follow up of his carotid disease as well as a recent CT scan of his chest abdomen and pelvis.  He had a CT scan of the neck as well.  I have reviewed both scans.  He had some aortic atherosclerosis and mild iliac artery narrowing but this was stable from her previous scan and certainly not a critical situation.  He is on appropriate medical therapy for this. As for his carotid disease, he is 1 year status post right carotid stent placement.  This was patent both on his recent duplex and the CT scan.  He had a duplex done at the hospital about 2 months ago which showed greater than 70% left ICA stenosis.  CT angiogram was officially reported as a 60% left ICA stenosis with a patent right carotid stent.  I think this is significantly under estimated by my review.  It would clearly appear to be at least a 75 to 80% stenosis.  Current Outpatient Medications  Medication Sig Dispense Refill   apixaban  (ELIQUIS ) 5 MG TABS tablet Take 1 tablet (5 mg total) by mouth 2 (two) times daily. 180 tablet 1   clopidogrel  (PLAVIX ) 75 MG tablet Take 75 mg by mouth daily.     DULoxetine  (CYMBALTA ) 30 MG capsule Take 1 capsule (30 mg total) by mouth daily. 30 capsule 3   hydrOXYzine  (VISTARIL ) 25 MG capsule Take 25 mg by mouth 2 (two) times daily.     irbesartan  (AVAPRO ) 300 MG tablet Take 1 tablet (300 mg total) by mouth daily. 90 tablet 3   isosorbide  mononitrate (IMDUR ) 60 MG 24 hr tablet Take 1 tablet (60 mg total) by mouth 2 (two) times daily. 180 tablet 3   metoprolol  succinate (TOPROL -XL) 25 MG 24 hr tablet Take 3 tablets (75 mg total) by mouth daily. Take with or immediately following a meal. 90 tablet 3   mometasone -formoterol  (DULERA ) 200-5 MCG/ACT AERO Inhale 2 puffs into the  lungs 2 (two) times daily. 1 each 0   Multiple Vitamin (MULTIVITAMIN WITH MINERALS) TABS tablet Take 1 tablet by mouth daily. 30 tablet 0   nicotine  (NICODERM CQ  - DOSED IN MG/24 HOURS) 21 mg/24hr patch Place 21 mg onto the skin daily as needed.     nitroGLYCERIN  (NITROSTAT ) 0.4 MG SL tablet Place 1 tablet (0.4 mg total) under the tongue every 5 (five) minutes as needed for chest pain. 25 tablet 0   pantoprazole  (PROTONIX ) 40 MG tablet Take 1 tablet (40 mg total) by mouth daily. 30 tablet 1   QUEtiapine  (SEROQUEL ) 200 MG tablet Take 1 tablet (200 mg total) by mouth at bedtime. 30 tablet 1   rosuvastatin  (CRESTOR ) 40 MG tablet Take 1 tablet (40 mg total) by mouth daily. 90 tablet 3   SPIRIVA  RESPIMAT 1.25 MCG/ACT AERS Inhale 2 sprays into the lungs daily. 4 g 1   VENTOLIN  HFA 108 (90 Base) MCG/ACT inhaler Inhale 1-2 puffs into the lungs every 4 (four) hours as needed for shortness of breath or wheezing. SMARTSIG:2 Puff(s) By Mouth Every 4 Hours PRN 8 g 1   ezetimibe  (ZETIA ) 10 MG tablet Take 1 tablet (10 mg total) by mouth daily. 90 tablet 3   No current facility-administered medications for this visit.  Past Medical History:  Diagnosis Date   CAD in native artery    a. 06/28/2014: STEMI, cath LM nl, mLAD 50%, mLCx 20%, OM1 small in size 80%, ramus 100% s/p PCI/DES 0%, EF 40%    Carotid artery disease (HCC)    CHF (congestive heart failure) (HCC)    Cocaine abuse (HCC)    Depression    H/O gastric ulcer    Hyperglycemia    a. A1C 5.6%   Hypertension    Ischemic cardiomyopathy    a. EF 40% by cath 06/28/2014   MI (myocardial infarction) (HCC)    Polysubstance abuse (HCC)    a. cocaine, tobacco, and alcohol    Tobacco abuse     Past Surgical History:  Procedure Laterality Date   CARDIAC CATHETERIZATION  06/28/2014   x1 stent   CAROTID PTA/STENT INTERVENTION Right 08/31/2022   Procedure: CAROTID PTA/STENT INTERVENTION;  Surgeon: Celso College, MD;  Location: [facility];   Service: Cardiovascular;  Laterality: Right;   COLONOSCOPY N/A 08/12/2022   Procedure: COLONOSCOPY;  Surgeon: Toledo, Alphonsus Jeans, MD;  Location: [facility];  Service: Gastroenterology;  Laterality: N/A;   CORONARY STENT INTERVENTION N/A 08/26/2022   Procedure: CORONARY STENT INTERVENTION;  Surgeon: Sammy Crisp, MD;  Location: [facility];  Service: Cardiovascular;  Laterality: N/A;   ESOPHAGOGASTRODUODENOSCOPY N/A 08/12/2022   Procedure: ESOPHAGOGASTRODUODENOSCOPY (EGD);  Surgeon: Toledo, Alphonsus Jeans, MD;  Location: [facility] ENDOS[facility];  Service: Gastroenterology;  Laterality: N/A;   LEFT HEART CATH AND CORONARY ANGIOGRAPHY Left 08/26/2022   Procedure: LEFT HEART CATH AND CORONARY ANGIOGRAPHY;  Surgeon: Sammy Crisp, MD;  Location: [facility];  Service: Cardiovascular;  Laterality: Left;   NISSEN FUNDOPLICATION     stomach ulcer surgery       Social History   Tobacco Use   Smoking status: Every Day    Current packs/day: 1.00    Average packs/day: 1 pack/day for 48.4 years (48.4 ttl pk-yrs)    Types: Cigarettes    Start date: 07/21/1975   Smokeless tobacco: Never   Tobacco comments:    Pt. Is actively trying to quit and is wearing the patch.   Vaping Use   Vaping status: Never Used  Substance Use Topics   Alcohol use: Yes    Alcohol/week: 12.0 standard drinks of alcohol    Types: 12 Cans of beer per week   Drug use: No    Types: Cocaine, Marijuana    Comment: none in at least 2 years      Family History  Problem Relation Age of Onset   Heart failure Mother    Stroke Father    Heart failure Father    Prostate cancer Brother    Heart attack Brother      Allergies  Allergen Reactions   Oxycodone-Acetaminophen  Other (See Comments)   Codeine Rash     REVIEW OF SYSTEMS (Negative unless checked)   Constitutional: [] Weight loss  [] Fever  [] Chills Cardiac: [] Chest pain   [] Chest pressure   [] Palpitations   [] Shortness of breath when laying flat    [] Shortness of breath at rest   [x] Shortness of breath with exertion. Vascular:  [] Pain in legs with walking   [] Pain in legs at rest   [] Pain in legs when laying flat   [] Claudication   [] Pain in feet when walking  [] Pain in feet at rest  [] Pain in feet when laying flat   [] History of DVT   [] Phlebitis   [] Swelling in  legs   [] Varicose veins   [] Non-healing ulcers Pulmonary:   [] Uses home oxygen   [] Productive cough   [] Hemoptysis   [] Wheeze  [] COPD   [] Asthma Neurologic:  [] Dizziness  [x] Blackouts   [] Seizures   [] History of stroke   [] History of TIA  [] Aphasia   [] Temporary blindness   [] Dysphagia   [] Weakness or numbness in arms   [] Weakness or numbness in legs Musculoskeletal:  [x] Arthritis   [] Joint swelling   [] Joint pain   [] Low back pain Hematologic:  [] Easy bruising  [] Easy bleeding   [] Hypercoagulable state   [] Anemic  [] Hepatitis Gastrointestinal:  [] Blood in stool   [] Vomiting blood  [] Gastroesophageal reflux/heartburn   [] Abdominal pain Genitourinary:  [] Chronic kidney disease   [] Difficult urination  [] Frequent urination  [] Burning with urination   [] Hematuria Skin:  [] Rashes   [] Ulcers   [] Wounds Psychological:  [] History of anxiety   [x]  History of major depression.  Physical Examination  BP (!) 189/101   Pulse 70   Resp 18   Ht 5\' 3"  (1.6 m)   Wt 163 lb 6.4 oz (74.1 kg)   BMI 28.95 kg/m  Gen:  WD/WN, NAD. Appears older than stated age. Head: [place]/AT, No temporalis wasting. Ear/Nose/Throat: Hearing grossly intact, nares w/o erythema or drainage Eyes: Conjunctiva clear. Sclera non-icteric Neck: Supple.  Trachea midline Pulmonary:  Good air movement, no use of accessory muscles.  Cardiac: RRR, no JVD Vascular:  Vessel Right Left  Radial Palpable Palpable       Musculoskeletal: M/S 5/5 throughout.  No deformity or atrophy. No edema. Neurologic: Sensation grossly intact in extremities.  Symmetrical.  Speech is fluent.  Psychiatric: Judgment intact, Mood & affect  appropriate for pt's clinical situation. Dermatologic: No rashes or ulcers noted.  No cellulitis or open wounds.      Labs Recent Results (from the past 2160 hours)  NM PET CT CARDIAC PERFUSION MULTI W/ABSOLUTE BLOODFLOW     Status: None   Collection Time: 10/07/23  9:28 AM  Result Value Ref Range   Rest Nuclear Isotope Dose 19.7 mCi   Stress Nuclear Isotope Dose 19.7 mCi   Rest HR 66.0 bpm   Rest BP 154/59 mmHg   Peak HR 93 bpm   Peak BP 160/64 mmHg   SSS 15.0    SRS 11.0    TID 1.10    Nuc Stress EF 49 %   Nuc Rest EF 46 %   ST Depression (mm) 0 mm   LV sys vol 62.0 mL   LV dias vol 122.0 62 - 150 mL   Rest MBF 0.71 ml/g/min   Stress MBF 1.45 ml/g/min   MBFR 2.04   I-STAT creatinine     Status: None   Collection Time: 10/14/23  9:21 AM  Result Value Ref Range   Creatinine, Ser 1.10 0.61 - 1.24 mg/dL  Basic metabolic panel     Status: Abnormal   Collection Time: 12/06/23  8:25 AM  Result Value Ref Range   Sodium 136 135 - 145 mmol/L   Potassium 3.4 (L) 3.5 - 5.1 mmol/L   Chloride 103 98 - 111 mmol/L   CO2 18 (L) 22 - 32 mmol/L   Glucose, Bld 130 (H) 70 - 99 mg/dL    Comment: Glucose reference range applies only to samples taken after fasting for at least 8 hours.   BUN 17 6 - 20 mg/dL   Creatinine, Ser 1.61 0.61 - 1.24 mg/dL   Calcium  9.1 8.9 - 10.3 mg/dL  GFR, Estimated >60 >60 mL/min    Comment: (NOTE) Calculated using the CKD-EPI Creatinine Equation (2021)    Anion gap 15 5 - 15    Comment: Performed at [facility], [address] [place], [place] [address]  CBC     Status: Abnormal   Collection Time: 12/06/23  8:25 AM  Result Value Ref Range   WBC 10.9 (H) 4.0 - 10.5 K/uL   RBC 5.11 4.22 - 5.81 MIL/uL   Hemoglobin 16.4 13.0 - 17.0 g/dL   HCT 44.0 10.2 - 72.5 %   MCV 95.9 80.0 - 100.0 fL   MCH 32.1 26.0 - 34.0 pg   MCHC 33.5 30.0 - 36.0 g/dL   RDW 36.6 44.0 - 34.7 %   Platelets 199 150 - 400 K/uL   nRBC 0.0 0.0 - 0.2 %    Comment:  Performed at [facility], [address], [place], [place] [address]  Troponin I (High Sensitivity)     Status: Abnormal   Collection Time: 12/06/23  8:25 AM  Result Value Ref Range   Troponin I (High Sensitivity) 31 (H) <18 ng/L    Comment: (NOTE) Elevated high sensitivity troponin I (hsTnI) values and significant  changes across serial measurements may suggest ACS but many other  chronic and acute conditions are known to elevate hsTnI results.  Refer to the "Links" section for chest pain algorithms and additional  guidance. Performed at [facility], [address] [place], [place] [address]   Protime-INR (order if Patient is taking Coumadin / Warfarin)     Status: None   Collection Time: 12/06/23  8:25 AM  Result Value Ref Range   Prothrombin Time 13.9 11.4 - 15.2 seconds   INR 1.1 0.8 - 1.2    Comment: (NOTE) INR goal varies based on device and disease states. Performed at [facility], [address] [place], [place] [address]   Hepatic function panel     Status: Abnormal   Collection Time: 12/06/23  8:30 AM  Result Value Ref Range   Total Protein 8.2 (H) 6.5 - 8.1 g/dL   Albumin 4.4 3.5 - 5.0 g/dL   AST 951 (H) 15 - 41 U/L   ALT 90 (H) 0 - 44 U/L   Alkaline Phosphatase 74 38 - 126 U/L   Total Bilirubin 2.4 (H) 0.0 - 1.2 mg/dL   Bilirubin, Direct 0.4 (H) 0.0 - 0.2 mg/dL   Indirect Bilirubin 2.0 (H) 0.3 - 0.9 mg/dL    Comment: Performed at [facility], [address] [place], [place] [address]  Lipase, blood     Status: None   Collection Time: 12/06/23  8:30 AM  Result Value Ref Range   Lipase 41 11 - 51 U/L    Comment: Performed at [facility], [address], [place], [place] [address]  Troponin I (High Sensitivity)     Status: Abnormal   Collection Time: 12/06/23 10:25 AM  Result Value Ref Range   Troponin I (High Sensitivity) 36 (H) <18 ng/L    Comment: (NOTE) Elevated high sensitivity troponin I (hsTnI)  values and significant  changes across serial measurements may suggest ACS but many other  chronic and acute conditions are known to elevate hsTnI results.  Refer to the "Links" section for chest pain algorithms and additional  guidance. Performed at [facility], [address], [place], [place] [address]     Radiology CT HEAD WO CONTRAST ( ) Result Date: 12/06/2023 CLINICAL  DATA:  Head trauma, abnormal mental status (Age 82-64y); Ataxia, cervical trauma. EXAM: CT HEAD WITHOUT CONTRAST CT CERVICAL SPINE WITHOUT CONTRAST TECHNIQUE: Multidetector CT imaging of the head and cervical spine was performed following the standard protocol without intravenous contrast. Multiplanar CT image reconstructions of the cervical spine were also generated. RADIATION DOSE REDUCTION: This exam was performed according to the departmental dose-optimization program which includes automated exposure control, adjustment of the mA and/or kV according to patient size and/or use of iterative reconstruction technique. COMPARISON:  CT angiography neck from 10/14/2023 and CT scan head from 11/17/2009. FINDINGS: CT HEAD FINDINGS Brain: No evidence of acute infarction, hemorrhage, hydrocephalus, extra-axial collection or mass lesion/mass effect. There is bilateral periventricular hypodensity, which is non-specific but most likely seen in the settings of microvascular ischemic changes. Mild in extent. Otherwise normal appearance of brain parenchyma. Ventricles are prominent but cerebral volume is age appropriate. Vascular: No hyperdense vessel or unexpected calcification. Intracranial arteriosclerosis. Skull: Normal. Negative for fracture or focal lesion. Sinuses/Orbits: Moderate mucoperiosteal thickening noted in the bilateral frontal sinus, bilateral frontoethmoidal recess, bilateral chambers of sphenoid sinus and bilateral ethmoidal air cells. No air-fluid levels to suggest acute sinusitis. Other: Visualized mastoid air  cells are unremarkable. No mastoid effusion. CT CERVICAL SPINE FINDINGS Alignment: There is reversal of cervical lordosis, which may be positional or due to muscle spasm. This examination does not assess for ligamentous injury or stability. Skull base and vertebrae: No acute fracture. No primary bone lesion or focal pathologic process. Soft tissues and spinal canal: No prevertebral fluid or swelling. No visible canal hematoma. Disc levels: Mild-to-moderate multilevel degenerative changes characterized by reduced intervertebral disc height, most pronounced at C6-7 level, and mild multilevel marginal osteophyte formation. Upper chest: Negative. Other: Vascular stent noted in the proximal portion of right internal carotid artery. IMPRESSION: 1. No acute intracranial abnormality. 2. No acute osseous injury or traumatic listhesis of the cervical spine. 3. Other nonacute observations, as described above. Electronically Signed   By: Beula Brunswick M.D.   On: 12/06/2023 10:52   CT Cervical Spine Wo Contrast Result Date: 12/06/2023 CLINICAL DATA:  Head trauma, abnormal mental status (Age 43-64y); Ataxia, cervical trauma. EXAM: CT HEAD WITHOUT CONTRAST CT CERVICAL SPINE WITHOUT CONTRAST TECHNIQUE: Multidetector CT imaging of the head and cervical spine was performed following the standard protocol without intravenous contrast. Multiplanar CT image reconstructions of the cervical spine were also generated. RADIATION DOSE REDUCTION: This exam was performed according to the departmental dose-optimization program which includes automated exposure control, adjustment of the mA and/or kV according to patient size and/or use of iterative reconstruction technique. COMPARISON:  CT angiography neck from 10/14/2023 and CT scan head from 11/17/2009. FINDINGS: CT HEAD FINDINGS Brain: No evidence of acute infarction, hemorrhage, hydrocephalus, extra-axial collection or mass lesion/mass effect. There is bilateral periventricular  hypodensity, which is non-specific but most likely seen in the settings of microvascular ischemic changes. Mild in extent. Otherwise normal appearance of brain parenchyma. Ventricles are prominent but cerebral volume is age appropriate. Vascular: No hyperdense vessel or unexpected calcification. Intracranial arteriosclerosis. Skull: Normal. Negative for fracture or focal lesion. Sinuses/Orbits: Moderate mucoperiosteal thickening noted in the bilateral frontal sinus, bilateral frontoethmoidal recess, bilateral chambers of sphenoid sinus and bilateral ethmoidal air cells. No air-fluid levels to suggest acute sinusitis. Other: Visualized mastoid air cells are unremarkable. No mastoid effusion. CT CERVICAL SPINE FINDINGS Alignment: There is reversal of cervical lordosis, which may be positional or due to muscle spasm. This examination does not assess for ligamentous injury  or stability. Skull base and vertebrae: No acute fracture. No primary bone lesion or focal pathologic process. Soft tissues and spinal canal: No prevertebral fluid or swelling. No visible canal hematoma. Disc levels: Mild-to-moderate multilevel degenerative changes characterized by reduced intervertebral disc height, most pronounced at C6-7 level, and mild multilevel marginal osteophyte formation. Upper chest: Negative. Other: Vascular stent noted in the proximal portion of right internal carotid artery. IMPRESSION: 1. No acute intracranial abnormality. 2. No acute osseous injury or traumatic listhesis of the cervical spine. 3. Other nonacute observations, as described above. Electronically Signed   By: Beula Brunswick M.D.   On: 12/06/2023 10:52   CT Angio Chest/Abd/Pel for Dissection W and/or Wo Contrast Result Date: 12/06/2023 CLINICAL DATA:  Acute aortic syndrome (AAS) suspected. Central and left-sided sharp chest pain. EXAM: CT ANGIOGRAPHY CHEST, ABDOMEN AND PELVIS TECHNIQUE: Non-contrast CT of the chest was initially obtained. Multidetector CT  imaging through the chest, abdomen and pelvis was performed using the standard protocol during bolus administration of intravenous contrast. Multiplanar reconstructed images and MIPs were obtained and reviewed to evaluate the vascular anatomy. RADIATION DOSE REDUCTION: This exam was performed according to the departmental dose-optimization program which includes automated exposure control, adjustment of the mA and/or kV according to patient size and/or use of iterative reconstruction technique. CONTRAST:  OMNIPAQUE  IOHEXOL  350 MG/ML SOLN COMPARISON:  CT Angiograph chest, abdomen and pelvis from 08/19/2023. FINDINGS: CTA CHEST FINDINGS Cardiovascular: No acute intramural hematoma noted in the thoracic aorta on the unenhanced images. Thoracic aorta is normal in caliber without aneurysm, dissection, vasculitis or significant stenosis. Normal cardiac size. No pericardial effusion. No aortic aneurysm. There are coronary artery calcifications, in keeping with coronary artery disease. There are also mild peripheral atherosclerotic vascular calcifications of thoracic aorta and its major branches. There is satisfactory opacification of bilateral pulmonary arteries. No embolism seen up to the proximal subsegmental pulmonary artery level. Mediastinum/Nodes: Visualized thyroid gland appears grossly unremarkable. No solid / cystic mediastinal masses. The esophagus is nondistended precluding optimal assessment. No axillary, mediastinal or hilar lymphadenopathy by size criteria. Lungs/Pleura: The central tracheo-bronchial tree is patent. There are dependent changes in bilateral lungs. No mass or consolidation. No pleural effusion or pneumothorax. No suspicious lung nodules. Musculoskeletal: The visualized soft tissues of the chest wall are grossly unremarkable. No suspicious osseous lesions. There are mild multilevel degenerative changes in the visualized spine. Review of the MIP images confirms the above findings. CTA  ABDOMEN AND PELVIS FINDINGS VASCULAR Aorta: There is a small focal intimal flap noted along the anterior left side of the infrarenal abdominal aorta (series 7, image 158 and series 10, image 70), extending from 12 to 4 o'clock position, without associated aneurysm. Appearance is essentially unchanged since the prior study. The abdominal aorta is otherwise normal in caliber and without aneurysm, vasculitis or significant stenosis. Celiac: Patent without evidence of aneurysm, dissection, vasculitis or significant stenosis. SMA: Patent without evidence of aneurysm, dissection, vasculitis or significant stenosis. Renals: Both renal arteries are patent without evidence of aneurysm, dissection, vasculitis, fibromuscular dysplasia or significant stenosis. IMA: There is moderate to severe narrowing at the origin. The artery is otherwise patent without evidence of aneurysm, dissection, or vasculitis. Inflow: Moderate narrowing at the origin of right common iliac artery due to predominantly noncalcified plaque, grossly similar to the prior study. There is moderate narrowing of the left internal iliac artery at the level of origin, grossly similar to the prior study. The arteries are otherwise patent without evidence of aneurysm, dissection, vasculitis  or significant stenosis. Veins: No obvious venous abnormality within the limitations of this arterial phase study. Review of the MIP images confirms the above findings. NON-VASCULAR Hepatobiliary: The liver is normal in size. Non-cirrhotic configuration. No suspicious mass. No intrahepatic or extrahepatic bile duct dilation. No calcified gallstones. Normal gallbladder wall thickness. No pericholecystic inflammatory changes. Pancreas: Unremarkable. No pancreatic ductal dilatation or surrounding inflammatory changes. Spleen: Within normal limits. No focal lesion. Adrenals/Urinary Tract: Adrenal glands are unremarkable. No suspicious renal mass. No hydronephrosis. No renal or  ureteric calculi. Unremarkable urinary bladder. Stomach/Bowel: No disproportionate dilation of the small or large bowel loops. No evidence of abnormal bowel wall thickening or inflammatory changes. The appendix is unremarkable. There are multiple diverticula mainly in the left hemi colon, without imaging signs of diverticulitis. Vascular/Lymphatic: No ascites or pneumoperitoneum. No abdominal or pelvic lymphadenopathy, by size criteria. There are mild peripheral atherosclerotic vascular calcifications of the aorta and its major branches. Reproductive: Normal size prostate. Symmetric seminal vesicles. Other: There is a small fat containing right periumbilical hernia. There are bilateral small fat containing inguinal hernias. The soft tissues and abdominal wall are otherwise unremarkable. Musculoskeletal: No suspicious osseous lesions. There are mild - moderate multilevel degenerative changes in the visualized spine. Review of the MIP images confirms the above findings. IMPRESSION: 1. No acute aortic syndrome. No acute inflammatory process identified within the chest, abdomen or pelvis. 2. There is a small focal intimal flap along the anterior left side of the infrarenal abdominal aorta, as described above, essentially unchanged since the prior study. Moderate narrowing of the right common iliac artery and left internal iliac artery at origin, as described above. Moderate to severe narrowing of the inferior mesenteric artery at origin. 3. Multiple other nonacute observations, as described above. Aortic Atherosclerosis (ICD10-I70.0). Electronically Signed   By: Beula Brunswick M.D.   On: 12/06/2023 10:44   DG Chest 2 View Result Date: 12/06/2023 CLINICAL DATA:  58 year old male with chest pain EXAM: CHEST - 2 VIEW COMPARISON:  11/09/2022 FINDINGS: Cardiomediastinal silhouette unchanged in size and contour. No evidence of central vascular congestion. No interlobular septal thickening. No pneumothorax or pleural  effusion. Coarsened interstitial markings, with no confluent airspace disease. No acute displaced fracture. Degenerative changes of the spine. IMPRESSION: No active cardiopulmonary disease. Electronically Signed   By: Myrlene Asper D.O.   On: 12/06/2023 09:36    Assessment/Plan  Hypertension blood pressure control important in reducing the progression of atherosclerotic disease. On appropriate oral medications.   Hyperlipidemia lipid control important in reducing the progression of atherosclerotic disease. Continue statin therapy   Polysubstance abuse (HCC) Particularly stimulants such as cocaine or methamphetamines significantly increase the risk of vascular disease.  Tobacco abuse Increases risk of atherosclerotic vascular disease and cessation would be of benefit.  Aortic atherosclerosis (HCC) He had some aortic atherosclerosis and mild iliac artery narrowing but this was stable from her previous scan and certainly not a critical situation.  He is on appropriate medical therapy for this.  I would continue this medical therapy.  We can check ABIs annually going forward unless he develops worrisome PAD symptoms.  Ischemic cardiomyopathy Carotid stent would be preferable to carotid endarterectomy to avoid general anesthesia secondary to his severe heart disease.  Carotid stenosis As for his carotid disease, he is 1 year status post right carotid stent placement.  This was patent both on his recent duplex and the CT scan.  He had a duplex done at the hospital about 2 months ago which showed  greater than 70% left ICA stenosis.  CT angiogram was officially reported as a 60% left ICA stenosis with a patent right carotid stent.  I think this is significantly under estimated by my review.  It would clearly appear to be at least a 75 to 80% stenosis. Given the duplex findings and my evaluation of the CT scan, I think a carotid angiogram would be prudent at this point for further evaluation of the  disease.  If this is indeed a greater than 70% stenosis, carotid stent placement would be planned at that time as long as the anatomy is amenable.  He has significant cardiomyopathy and avoidance of general anesthesia would be preferable for treatment of his carotid disease as long as the anatomy is not prohibitive for carotid stenting.  I have discussed the risks and benefits of the procedure.  Patient is on Plavix  and Eliquis  as well as a statin agent and should continue these.    Mikki Alexander, MD  12/17/2023 2:12 PM    This note was created with Dragon medical transcription system.  Any errors from dictation are purely unintentional

## 2023-12-17 NOTE — Assessment & Plan Note (Signed)
 He had some aortic atherosclerosis and mild iliac artery narrowing but this was stable from her previous scan and certainly not a critical situation.  He is on appropriate medical therapy for this.  I would continue this medical therapy.  We can check ABIs annually going forward unless he develops worrisome PAD symptoms.

## 2023-12-17 NOTE — Patient Instructions (Signed)
Carotid Angioplasty With Stent Carotid angioplasty with stent is a procedure to open or widen an artery in the neck (carotid artery) that has become narrowed. This is done by inflating a small balloon inside the artery and then placing a small piece of metal that looks like a coil or spring (stent) inside the artery. The stent helps keep the artery open by supporting the artery walls. The carotid arteries supply blood to the brain. When fats, cholesterol, and other materials (plaque) build up in an artery, the artery becomes narrow and can become blocked. This can reduce or block blood flow to certain areas of the brain, which can cause serious health problems, including stroke. The stent helps to keep the artery open so that blood can flow to the brain. Tell a health care provider about: Any allergies you have. All medicines you are taking, including vitamins, herbs, eye drops, creams, and over-the-counter medicines. Any problems you or family members have had with anesthesia. Any bleeding problems you have. Any surgeries you have had. Any medical conditions you have. Whether you are pregnant or may be pregnant. What are the risks? Your health care provider will talk with you about risks. These may include: Stroke. The stent becoming blocked. Problems at the access site, such as a large amount of blood collecting under your skin (hematoma). Allergic reactions to medicines or dyes. Damage to other structures or organs, or to the carotid artery. Infection. Heart attack. Death. This is rare. What happens before the procedure? Follow instructions from your health care provider about what you may eat and drink. Ask your health care provider about: Changing or stopping your regular medicines. These include any diabetes medicines or blood thinners (anticoagulants) you take. Whether aspirin or other blood thinners are recommended before this procedure. Taking over-the-counter medicines, vitamins,  herbs, and supplements. Do not use any products that contain nicotine or tobacco for at least 4 weeks before the procedure. These products include cigarettes, chewing tobacco, and vaping devices, such as e-cigarettes. If you need help quitting, ask your health care provider. Ask your health care provider: How your surgery site will be marked. What steps will be taken to help prevent infection. These steps may include washing skin with a soap that kills germs. You may have blood tests and imaging tests. What happens during the procedure?  An IV will be inserted into one of your veins. You may be given: A sedative. This helps you relax. Anesthesia. This will: Numb certain areas of your body. Make you fall asleep for surgery. Most commonly, an incision will be made in your groin. In some cases, an incision may be made in your wrist or forearm instead of your groin. A small, thin tube (catheter) will be inserted through an incision and into an artery. The catheter will be threaded upward into your carotid artery. An X-ray machine will help your health care provider guide the catheter to the correct place in your artery. Dye will be injected into the catheter and will travel to the narrow or blocked part of your carotid artery. X-ray images will be taken of how the dye flows through your artery. While the images are being taken, you may be given instructions about breathing, swallowing, moving, or talking. A filter will be inserted into your artery. This will be used to catch plaque that comes loose in your artery during the procedure. This reduces the risk of plaque moving into your brain. A small balloon will be inserted into your artery.  The balloon will be inflated for a few seconds to widen your artery and will then be removed. The stent will be placed in your artery. A second small balloon will be inserted into your artery and inflated. This expands the stent inside of your artery so that the  stent holds the artery walls open. The second balloon will then be removed. The catheter, filter, and first balloon will be removed from your artery and pressure will be held on your carotid artery to stop bleeding. Your incision may be closed with stitches (sutures), skin glue, or adhesive strips. A bandage (dressing) will be placed over your incision. The procedure may vary among health care providers and hospitals. What happens after the procedure? Your blood pressure, heart rate, breathing rate, and blood oxygen level will be monitored until you leave the hospital or clinic. Your mental status and movements (neurological status) will be monitored. You may need to have pressure placed on the incision site to prevent bleeding. You will need to keep the area still for a few hours, or as long as told by your health care provider. If the procedure was done in your groin, you will be told not to bend or cross your legs. Most people stay in the hospital overnight. This information is not intended to replace advice given to you by your health care provider. Make sure you discuss any questions you have with your health care provider. Document Revised: 12/02/2021 Document Reviewed: 12/02/2021 Elsevier Patient Education  2024 [employer].

## 2023-12-17 NOTE — Assessment & Plan Note (Signed)
 Particularly stimulants such as cocaine or methamphetamines significantly increase the risk of vascular disease.

## 2023-12-17 NOTE — Assessment & Plan Note (Signed)
 blood pressure control important in reducing the progression of atherosclerotic disease. On appropriate oral medications.

## 2023-12-17 NOTE — Assessment & Plan Note (Signed)
 Increases risk of atherosclerotic vascular disease and cessation would be of benefit.

## 2023-12-22 ENCOUNTER — Telehealth (INDEPENDENT_AMBULATORY_CARE_PROVIDER_SITE_OTHER): Payer: Self-pay

## 2023-12-22 NOTE — Telephone Encounter (Signed)
 Spoke with the patient and he is scheduled for a left carotid stent placement with Dr. Vonna Guardian  at the [facility]. Pre-procedure instructions were discussed and will be mailed to patient.

## 2023-12-27 ENCOUNTER — Telehealth (INDEPENDENT_AMBULATORY_CARE_PROVIDER_SITE_OTHER): Payer: Self-pay

## 2023-12-27 DIAGNOSIS — I6522 Occlusion and stenosis of left carotid artery: Secondary | ICD-10-CM

## 2023-12-27 NOTE — Telephone Encounter (Signed)
 I attempted to contact the patient to reschedule his left carotid stent placement with Dr. Vonna Guardian that was for 12/27/23 to 01/03/24. Per request of patient when he called and left a message to reschedule. Patient will be rescheduled to 01/03/24 with a 8:30 am arrival time to the [facility]. Pre-procedure instructions will be mailed.

## 2024-01-03 ENCOUNTER — Inpatient Hospital Stay
Admission: RE | Admit: 2024-01-03 | Discharge: 2024-01-04 | DRG: 036 | Disposition: A | Discharge status: Home / Self Care | Source: Ambulatory Visit | Attending: Vascular Surgery | Admitting: Vascular Surgery

## 2024-01-03 ENCOUNTER — Other Ambulatory Visit: Payer: Self-pay

## 2024-01-03 ENCOUNTER — Encounter
Admission: RE | Disposition: A | Discharge status: Home / Self Care | Payer: Self-pay | Source: Ambulatory Visit | Attending: Vascular Surgery

## 2024-01-03 ENCOUNTER — Encounter: Payer: Self-pay | Admitting: Vascular Surgery

## 2024-01-03 DIAGNOSIS — E785 Hyperlipidemia, unspecified: Secondary | ICD-10-CM | POA: Diagnosis present

## 2024-01-03 DIAGNOSIS — Z8042 Family history of malignant neoplasm of prostate: Secondary | ICD-10-CM | POA: Diagnosis not present

## 2024-01-03 DIAGNOSIS — Z885 Allergy status to narcotic agent status: Secondary | ICD-10-CM | POA: Diagnosis not present

## 2024-01-03 DIAGNOSIS — I6522 Occlusion and stenosis of left carotid artery: Principal | ICD-10-CM | POA: Diagnosis present

## 2024-01-03 DIAGNOSIS — Z8249 Family history of ischemic heart disease and other diseases of the circulatory system: Secondary | ICD-10-CM

## 2024-01-03 DIAGNOSIS — Z8711 Personal history of peptic ulcer disease: Secondary | ICD-10-CM | POA: Diagnosis not present

## 2024-01-03 DIAGNOSIS — Z823 Family history of stroke: Secondary | ICD-10-CM | POA: Diagnosis not present

## 2024-01-03 DIAGNOSIS — Z95828 Presence of other vascular implants and grafts: Secondary | ICD-10-CM

## 2024-01-03 DIAGNOSIS — Z79899 Other long term (current) drug therapy: Secondary | ICD-10-CM

## 2024-01-03 DIAGNOSIS — I700 Atherosclerosis of aorta: Secondary | ICD-10-CM | POA: Diagnosis present

## 2024-01-03 DIAGNOSIS — Z7951 Long term (current) use of inhaled steroids: Secondary | ICD-10-CM | POA: Diagnosis not present

## 2024-01-03 DIAGNOSIS — R519 Headache, unspecified: Secondary | ICD-10-CM | POA: Diagnosis not present

## 2024-01-03 DIAGNOSIS — Z7902 Long term (current) use of antithrombotics/antiplatelets: Secondary | ICD-10-CM

## 2024-01-03 DIAGNOSIS — I255 Ischemic cardiomyopathy: Secondary | ICD-10-CM | POA: Diagnosis present

## 2024-01-03 DIAGNOSIS — Z955 Presence of coronary angioplasty implant and graft: Secondary | ICD-10-CM | POA: Diagnosis not present

## 2024-01-03 DIAGNOSIS — F1410 Cocaine abuse, uncomplicated: Secondary | ICD-10-CM | POA: Diagnosis present

## 2024-01-03 DIAGNOSIS — I2510 Atherosclerotic heart disease of native coronary artery without angina pectoris: Secondary | ICD-10-CM | POA: Diagnosis present

## 2024-01-03 DIAGNOSIS — F17210 Nicotine dependence, cigarettes, uncomplicated: Secondary | ICD-10-CM | POA: Diagnosis present

## 2024-01-03 DIAGNOSIS — I509 Heart failure, unspecified: Secondary | ICD-10-CM | POA: Diagnosis present

## 2024-01-03 DIAGNOSIS — I110 Hypertensive heart disease with heart failure: Secondary | ICD-10-CM | POA: Diagnosis present

## 2024-01-03 DIAGNOSIS — I252 Old myocardial infarction: Secondary | ICD-10-CM

## 2024-01-03 DIAGNOSIS — Z7901 Long term (current) use of anticoagulants: Secondary | ICD-10-CM

## 2024-01-03 HISTORY — PX: CAROTID PTA/STENT INTERVENTION: CATH118231

## 2024-01-03 LAB — CREATININE, SERUM
Creatinine, Ser: 0.82 mg/dL (ref 0.61–1.24)
GFR, Estimated: >60 mL/min (ref >60)

## 2024-01-03 LAB — POCT ACTIVATED CLOTTING TIME: Activated Clotting Time: 268 s

## 2024-01-03 LAB — MRSA NEXT GEN BY PCR, NASAL: MRSA by PCR Next Gen: NOT DETECTED

## 2024-01-03 LAB — GLUCOSE, CAPILLARY
Glucose-Capillary: 103 mg/dL — ABNORMAL HIGH (ref 70–99)
Glucose-Capillary: 117 mg/dL — ABNORMAL HIGH (ref 70–99)
Glucose-Capillary: 116 mg/dL — ABNORMAL HIGH (ref 70–99)

## 2024-01-03 LAB — BUN: BUN: 15 mg/dL (ref 6–20)

## 2024-01-03 SURGERY — CAROTID PTA/STENT INTERVENTION
Anesthesia: Moderate Sedation | Laterality: Left

## 2024-01-03 MED ORDER — METOPROLOL SUCCINATE ER 50 MG PO TB24
75.0000 mg | ORAL_TABLET | Freq: Every day | ORAL | Status: DC
  Administered 2024-01-03 – 2024-01-04 (×2): 75 mg via ORAL
  Filled 2024-01-03 (×2): qty 2

## 2024-01-03 MED ORDER — HYDROMORPHONE HCL 1 MG/ML IJ SOLN: 1.0000 mg | Freq: Once | INTRAMUSCULAR | Status: DC | PRN

## 2024-01-03 MED ORDER — HYDROXYZINE HCL 25 MG PO TABS
25.0000 mg | ORAL_TABLET | Freq: Two times a day (BID) | ORAL | Status: DC
  Administered 2024-01-03 – 2024-01-04 (×2): 25 mg via ORAL
  Filled 2024-01-03 (×2): qty 1

## 2024-01-03 MED ORDER — CEFAZOLIN SODIUM-DEXTROSE 2-4 GM/100ML-% IV SOLN
2.0000 g | Freq: Three times a day (TID) | INTRAVENOUS | Status: AC
  Administered 2024-01-03 – 2024-01-04 (×2): 2 g via INTRAVENOUS
  Filled 2024-01-03 (×2): qty 100

## 2024-01-03 MED ORDER — SORBITOL 70 % SOLN: 30.0000 mL | Freq: Every day | Status: DC | PRN

## 2024-01-03 MED ORDER — CEFAZOLIN SODIUM-DEXTROSE 2-4 GM/100ML-% IV SOLN
INTRAVENOUS | Status: AC
  Filled 2024-01-03: qty 100

## 2024-01-03 MED ORDER — DULOXETINE HCL 30 MG PO CPEP
30.0000 mg | ORAL_CAPSULE | Freq: Every day | ORAL | Status: DC
  Administered 2024-01-03 – 2024-01-04 (×2): 30 mg via ORAL
  Filled 2024-01-03 (×2): qty 1

## 2024-01-03 MED ORDER — MIDAZOLAM HCL 2 MG/2ML IJ SOLN
INTRAMUSCULAR | Status: DC | PRN
  Administered 2024-01-03 (×2): 1 mg via INTRAVENOUS

## 2024-01-03 MED ORDER — ATROPINE SULFATE 1 MG/10ML IJ SOSY
PREFILLED_SYRINGE | INTRAMUSCULAR | Status: AC
Start: 2024-01-03 — End: 2024-01-03
  Filled 2024-01-03: qty 10

## 2024-01-03 MED ORDER — ATROPINE SULFATE 1 MG/10ML IJ SOSY
PREFILLED_SYRINGE | INTRAMUSCULAR | Status: DC | PRN
  Administered 2024-01-03: 1 mg via INTRAVENOUS

## 2024-01-03 MED ORDER — PHENOL 1.4 % MT LIQD: 1.0000 | OROMUCOSAL | Status: DC | PRN

## 2024-01-03 MED ORDER — ONDANSETRON HCL 4 MG/2ML IJ SOLN: 4.0000 mg | Freq: Four times a day (QID) | INTRAMUSCULAR | Status: DC | PRN

## 2024-01-03 MED ORDER — ACETAMINOPHEN 325 MG RE SUPP
325.0000 mg | RECTAL | Status: DC | PRN
  Filled 2024-01-03: qty 2

## 2024-01-03 MED ORDER — HEPARIN SODIUM (PORCINE) 1000 UNIT/ML IJ SOLN
INTRAMUSCULAR | Status: AC
  Filled 2024-01-03: qty 10

## 2024-01-03 MED ORDER — ADULT MULTIVITAMIN W/MINERALS CH
1.0000 | ORAL_TABLET | Freq: Every day | ORAL | Status: DC
  Administered 2024-01-03 – 2024-01-04 (×2): 1 via ORAL
  Filled 2024-01-03 (×2): qty 1

## 2024-01-03 MED ORDER — PHENYLEPHRINE HCL-NACL 20-0.9 MG/250ML-% IV SOLN
INTRAVENOUS | Status: AC
Start: 2024-01-03 — End: 2024-01-03
  Filled 2024-01-03: qty 250

## 2024-01-03 MED ORDER — POTASSIUM CHLORIDE CRYS ER 20 MEQ PO TBCR: 40.0000 meq | EXTENDED_RELEASE_TABLET | Freq: Every day | ORAL | Status: DC | PRN

## 2024-01-03 MED ORDER — MIDAZOLAM HCL 2 MG/ML PO SYRP: 8.0000 mg | ORAL_SOLUTION | Freq: Once | ORAL | Status: DC | PRN

## 2024-01-03 MED ORDER — HEPARIN SODIUM (PORCINE) 1000 UNIT/ML IJ SOLN
INTRAMUSCULAR | Status: DC | PRN
  Administered 2024-01-03: 7000 [IU] via INTRAVENOUS

## 2024-01-03 MED ORDER — EZETIMIBE 10 MG PO TABS
10.0000 mg | ORAL_TABLET | Freq: Every day | ORAL | Status: DC
  Administered 2024-01-03 – 2024-01-04 (×2): 10 mg via ORAL
  Filled 2024-01-03 (×2): qty 1

## 2024-01-03 MED ORDER — MORPHINE SULFATE (PF) 4 MG/ML IV SOLN
2.0000 mg | INTRAVENOUS | Status: DC | PRN
  Administered 2024-01-03 (×3): 4 mg via INTRAVENOUS
  Administered 2024-01-03: 2 mg via INTRAVENOUS
  Administered 2024-01-04 (×2): 4 mg via INTRAVENOUS
  Filled 2024-01-03 (×6): qty 1

## 2024-01-03 MED ORDER — LIDOCAINE-EPINEPHRINE (PF) 1 %-1:200000 IJ SOLN
INTRAMUSCULAR | Status: DC | PRN
  Administered 2024-01-03: 20 mL

## 2024-01-03 MED ORDER — PHENYLEPHRINE 80 MCG/ML (10ML) SYRINGE FOR IV PUSH (FOR BLOOD PRESSURE SUPPORT)
PREFILLED_SYRINGE | INTRAVENOUS | Status: AC
Start: 2024-01-03 — End: 2024-01-03
  Filled 2024-01-03: qty 10

## 2024-01-03 MED ORDER — ROSUVASTATIN CALCIUM 20 MG PO TABS
40.0000 mg | ORAL_TABLET | Freq: Every day | ORAL | Status: DC
  Administered 2024-01-03 – 2024-01-04 (×2): 40 mg via ORAL
  Filled 2024-01-03: qty 4
  Filled 2024-01-03: qty 2
  Filled 2024-01-03: qty 4
  Filled 2024-01-03: qty 2

## 2024-01-03 MED ORDER — FENTANYL CITRATE (PF) 100 MCG/2ML IJ SOLN
INTRAMUSCULAR | Status: AC
  Filled 2024-01-03: qty 2

## 2024-01-03 MED ORDER — HEPARIN (PORCINE) IN NACL 2000-0.9 UNIT/L-% IV SOLN
INTRAVENOUS | Status: DC | PRN
Start: 2024-01-03 — End: 2024-01-03

## 2024-01-03 MED ORDER — ALBUTEROL SULFATE (2.5 MG/3ML) 0.083% IN NEBU: 2.5000 mg | INHALATION_SOLUTION | RESPIRATORY_TRACT | Status: DC | PRN

## 2024-01-03 MED ORDER — SODIUM CHLORIDE 0.9 % IV SOLN: INTRAVENOUS | Status: DC

## 2024-01-03 MED ORDER — MIDAZOLAM HCL 2 MG/2ML IJ SOLN
INTRAMUSCULAR | Status: AC
  Filled 2024-01-03: qty 4

## 2024-01-03 MED ORDER — CHLORHEXIDINE GLUCONATE CLOTH 2 % EX PADS
6.0000 | MEDICATED_PAD | Freq: Every day | CUTANEOUS | Status: DC
  Administered 2024-01-03: 6 via TOPICAL

## 2024-01-03 MED ORDER — SENNOSIDES-DOCUSATE SODIUM 8.6-50 MG PO TABS: 1.0000 | ORAL_TABLET | Freq: Every evening | ORAL | Status: DC | PRN

## 2024-01-03 MED ORDER — FENTANYL CITRATE PF 50 MCG/ML IJ SOSY
PREFILLED_SYRINGE | INTRAMUSCULAR | Status: AC
  Filled 2024-01-03: qty 1

## 2024-01-03 MED ORDER — HYDRALAZINE HCL 20 MG/ML IJ SOLN
INTRAMUSCULAR | Status: AC
  Filled 2024-01-03: qty 1

## 2024-01-03 MED ORDER — METHYLPREDNISOLONE SODIUM SUCC 125 MG IJ SOLR: 125.0000 mg | Freq: Once | INTRAMUSCULAR | Status: DC | PRN

## 2024-01-03 MED ORDER — FENTANYL CITRATE (PF) 100 MCG/2ML IJ SOLN
INTRAMUSCULAR | Status: DC | PRN
  Administered 2024-01-03 (×2): 50 ug via INTRAVENOUS

## 2024-01-03 MED ORDER — METOPROLOL TARTRATE 5 MG/5ML IV SOLN: 2.5000 mg | INTRAVENOUS | Status: DC | PRN

## 2024-01-03 MED ORDER — LABETALOL HCL 5 MG/ML IV SOLN: 10.0000 mg | INTRAVENOUS | Status: DC | PRN

## 2024-01-03 MED ORDER — NITROGLYCERIN 0.4 MG SL SUBL: 0.4000 mg | SUBLINGUAL_TABLET | SUBLINGUAL | Status: DC | PRN

## 2024-01-03 MED ORDER — HEPARIN SODIUM (PORCINE) 1000 UNIT/ML IJ SOLN
INTRAMUSCULAR | Status: AC
Start: 2024-01-03 — End: 2024-01-03
  Filled 2024-01-03: qty 10

## 2024-01-03 MED ORDER — DIPHENHYDRAMINE HCL 50 MG/ML IJ SOLN: 50.0000 mg | Freq: Once | INTRAMUSCULAR | Status: DC | PRN

## 2024-01-03 MED ORDER — FAMOTIDINE 20 MG PO TABS: 40.0000 mg | ORAL_TABLET | Freq: Once | ORAL | Status: DC | PRN

## 2024-01-03 MED ORDER — PANTOPRAZOLE SODIUM 40 MG PO TBEC
40.0000 mg | DELAYED_RELEASE_TABLET | Freq: Every day | ORAL | Status: DC
  Administered 2024-01-03 – 2024-01-04 (×2): 40 mg via ORAL
  Filled 2024-01-03 (×2): qty 1

## 2024-01-03 MED ORDER — ASPIRIN 81 MG PO TBEC
81.0000 mg | DELAYED_RELEASE_TABLET | Freq: Every day | ORAL | Status: DC
  Administered 2024-01-03 – 2024-01-04 (×2): 81 mg via ORAL
  Filled 2024-01-03 (×2): qty 1

## 2024-01-03 MED ORDER — SODIUM CHLORIDE 0.9 % IV SOLN: 500.0000 mL | Freq: Once | INTRAVENOUS | Status: DC | PRN

## 2024-01-03 MED ORDER — HEPARIN (PORCINE) IN NACL 1000-0.9 UT/500ML-% IV SOLN
INTRAVENOUS | Status: DC | PRN
  Administered 2024-01-03: 2000 mL

## 2024-01-03 MED ORDER — APIXABAN 5 MG PO TABS
5.0000 mg | ORAL_TABLET | Freq: Two times a day (BID) | ORAL | Status: DC
  Administered 2024-01-03 – 2024-01-04 (×3): 5 mg via ORAL
  Filled 2024-01-03 (×3): qty 1

## 2024-01-03 MED ORDER — ATROPINE SULFATE 1 MG/10ML IJ SOSY
PREFILLED_SYRINGE | INTRAMUSCULAR | Status: AC
  Filled 2024-01-03: qty 10

## 2024-01-03 MED ORDER — ORAL CARE MOUTH RINSE: 15.0000 mL | OROMUCOSAL | Status: DC | PRN

## 2024-01-03 MED ORDER — IRBESARTAN 150 MG PO TABS
300.0000 mg | ORAL_TABLET | Freq: Every day | ORAL | Status: DC
  Administered 2024-01-03 – 2024-01-04 (×2): 300 mg via ORAL
  Filled 2024-01-03 (×2): qty 2

## 2024-01-03 MED ORDER — ISOSORBIDE MONONITRATE ER 60 MG PO TB24
60.0000 mg | ORAL_TABLET | Freq: Two times a day (BID) | ORAL | Status: DC
  Administered 2024-01-03 – 2024-01-04 (×3): 60 mg via ORAL
  Filled 2024-01-03 (×3): qty 1

## 2024-01-03 MED ORDER — CEFAZOLIN SODIUM-DEXTROSE 2-4 GM/100ML-% IV SOLN
2.0000 g | INTRAVENOUS | Status: AC
  Administered 2024-01-03: 2 g via INTRAVENOUS

## 2024-01-03 MED ORDER — CLOPIDOGREL BISULFATE 75 MG PO TABS
75.0000 mg | ORAL_TABLET | Freq: Every day | ORAL | Status: DC
  Administered 2024-01-03 – 2024-01-04 (×2): 75 mg via ORAL
  Filled 2024-01-03 (×2): qty 1

## 2024-01-03 MED ORDER — ACETAMINOPHEN 325 MG PO TABS
325.0000 mg | ORAL_TABLET | ORAL | Status: DC | PRN
  Administered 2024-01-03 (×2): 650 mg via ORAL
  Filled 2024-01-03 (×2): qty 2

## 2024-01-03 MED ORDER — IODIXANOL 320 MG/ML IV SOLN
INTRAVENOUS | Status: DC | PRN
  Administered 2024-01-03: 70 mL

## 2024-01-03 MED ORDER — HYDRALAZINE HCL 20 MG/ML IJ SOLN: 5.0000 mg | INTRAMUSCULAR | Status: DC | PRN

## 2024-01-03 SURGICAL SUPPLY — 17 items
BALLOON VTRAC 4.5X30X135 (BALLOONS) IMPLANT
CATH ANGIO 5F 100CM .035 PIG (CATHETERS) IMPLANT
CATH JB2 100CM (CATHETERS) IMPLANT
COVER PROBE ULTRASOUND 5X96 (MISCELLANEOUS) IMPLANT
DEVICE EMBOSHIELD NAV6 4.0-7.0 (FILTER) IMPLANT
DEVICE PRESTO INFLATION (MISCELLANEOUS) IMPLANT
DEVICE STARCLOSE SE CLOSURE (Vascular Products) IMPLANT
GLIDEWIRE ANGLED SS 035X260CM (WIRE) IMPLANT
GUIDEWIRE VASC STIFF .038X260 (WIRE) IMPLANT
KIT CAROTID MANIFOLD (MISCELLANEOUS) IMPLANT
PACK ANGIOGRAPHY (CUSTOM PROCEDURE TRAY) ×1 IMPLANT
SHEATH BRITE TIP 6FRX11 (SHEATH) IMPLANT
SHEATH SHUTTLE 6FRX80 (SHEATH) IMPLANT
STENT XACT CAR 9-7X40X136 (Permanent Stent) IMPLANT
SUT MNCRL AB 4-0 PS2 18 (SUTURE) IMPLANT
SYR MEDRAD MARK 7 150ML (SYRINGE) IMPLANT
WIRE J 3MM .035X145CM (WIRE) IMPLANT

## 2024-01-03 NOTE — Plan of Care (Signed)
  Problem: Education: Goal: Knowledge of discharge needs will improve Outcome: Progressing   Problem: Clinical Measurements: Goal: Postoperative complications will be avoided or minimized Outcome: Progressing   Problem: Respiratory: Goal: Will achieve and/or maintain a regular respiratory rate, without signs or symptoms of dyspnea Outcome: Progressing   Problem: Skin Integrity: Goal: Demonstration of wound healing without infection will improve Outcome: Progressing   Problem: Education: Goal: Knowledge of General Education information will improve Description: Including pain rating scale, medication(s)/side effects and non-pharmacologic comfort measures Outcome: Progressing   Problem: Health Behavior/Discharge Planning: Goal: Ability to manage health-related needs will improve Outcome: Progressing   Problem: Clinical Measurements: Goal: Ability to maintain clinical measurements within normal limits will improve Outcome: Progressing Goal: Will remain free from infection Outcome: Progressing Goal: Diagnostic test results will improve Outcome: Progressing Goal: Respiratory complications will improve Outcome: Progressing Goal: Cardiovascular complication will be avoided Outcome: Progressing   Problem: Activity: Goal: Risk for activity intolerance will decrease Outcome: Progressing   Problem: Nutrition: Goal: Adequate nutrition will be maintained Outcome: Progressing   Problem: Coping: Goal: Level of anxiety will decrease Outcome: Progressing   Problem: Elimination: Goal: Will not experience complications related to bowel motility Outcome: Progressing Goal: Will not experience complications related to urinary retention Outcome: Progressing   Problem: Pain Managment: Goal: General experience of comfort will improve and/or be controlled Outcome: Progressing   Problem: Safety: Goal: Ability to remain free from injury will improve Outcome: Progressing   Problem:  Skin Integrity: Goal: Risk for impaired skin integrity will decrease Outcome: Progressing

## 2024-01-03 NOTE — Op Note (Signed)
 OPERATIVE NOTE DATE: 01/03/2024  PROCEDURE:  Ultrasound guidance for vascular access right femoral artery  Placement of a 9 mm proximal 7 mm distal 4 cm long Exact stent with the use of the NAV-6 embolic protection device in the left carotid artery  PRE-OPERATIVE DIAGNOSIS: 1. High grade left carotid artery stenosis.   POST-OPERATIVE DIAGNOSIS:  Same as above  SURGEON: Mikki Alexander, MD  ASSISTANT(S):  none  ANESTHESIA: local/MCS  ESTIMATED BLOOD LOSS:  25 cc  CONTRAST: 70 cc  FLUORO TIME: 3.1 minutes  MODERATE CONSCIOUS SEDATION TIME:  Approximately 43 minutes using 2 mg of Versed  and 100 mcg of Fentanyl   FINDING(S): 1.   85% left carotid artery stenosis  SPECIMEN(S):   none  INDICATIONS:   Patient is a 58 y.o. male who presents with high grade left carotid artery stenosis.  The patient has done well with carotid stenting on the right previously having done well with that and carotid artery stenting was felt to be preferred to endarterectomy for that reason. I have completed Share Decision Making with Freeda Jerry prior to surgery.  Conversations included: -Discussion of all treatment options including carotid endarterectomy (CEA), CAS (which includes transcarotid artery revascularization (TCAR)), and optimal medical therapy (OMT)). -Explanation of risks and benefits for each option specific to Javen Hinderliter Branum's clinical situation. -Integration of clinical guidelines as it relates to the patient's history and co-morbidities -Discussion and incorporation of Nickalus D Tritz and their personal preferences and priorities in choosing a treatment plan.  If patient was unable to participate in Shared Decision Making this process was done with the patient   Risks and benefits were discussed and informed consent was obtained.   DESCRIPTION: After obtaining full informed written consent, the patient was brought back to the vascular suite and placed supine upon the table.  The  patient received IV antibiotics prior to induction. Moderate conscious sedation was administered during a face to face encounter with the patient throughout the procedure with my supervision of the RN administering medicines and monitoring the patients vital signs and mental status throughout from the start of the procedure until the patient was taken to the recovery room.  After obtaining adequate anesthesia, the patient was prepped and draped in the standard fashion.   The right femoral artery was visualized with ultrasound and found to be widely patent. It was then accessed under direct ultrasound guidance without difficulty with a Seldinger needle. A permanent image was recorded. A J-wire was placed and we then placed a 6 French sheath. The patient was then heparinized and a total of 7000 units of intravenous heparin  were given and an ACT was checked to confirm successful anticoagulation. A pigtail catheter was then placed into the ascending aorta. This showed a bovine configuration without proximal stenosis. I then selectively cannulated the left common carotid artery without difficulty with a JB2 catheter and advanced into the mid left common carotid artery.  Cervical and cerebral carotid angiography was then performed. There were no obvious intracranial filling defects with good anterior and middle cerebral flow. The carotid bifurcation demonstrated an 85% stenosis in the ICA about 1-2 cm above the bifurcation.  I then advanced into the external carotid artery with a Glidewire and the JB2 catheter and then exchanged for the Amplatz Super Stiff wire. Over the Amplatz Super Stiff wire, a 6 [place] shuttle sheath was placed into the mid common carotid artery. I then used the NAV-6  Embolic protection device and crossed the lesion and  parked this in the distal internal carotid artery at the base of the skull.  I then selected a 9 mm proximal, 7 mm distal 4 cm long Exact stent. This was deployed across the lesion  encompassing it in its entirety. A 4.5 mm diameter x 3 cm length balloon was used to post dilate the stent. Only about a 15% residual stenosis was present after angioplasty. Completion angiogram showed normal intracranial filling without new defects. At this point I elected to terminate the procedure. The sheath was removed and StarClose closure device was deployed in the right femoral artery with excellent hemostatic result. The patient was taken to the recovery room in stable condition having tolerated the procedure well.  COMPLICATIONS: none  CONDITION: stable  Mikki Alexander 01/03/2024 10:13 AM   This note was created with Dragon Medical transcription system. Any errors in dictation are purely unintentional.

## 2024-01-03 NOTE — Interval H&P Note (Signed)
 History and Physical Interval Note:  01/03/2024 8:23 AM  Marcus [name]  has presented today for surgery, with the diagnosis of L Carotid Stent   ABBOTT    L Carotid stenosis.  The various methods of treatment have been discussed with the patient and family. After consideration of risks, benefits and other options for treatment, the patient has consented to  Procedure(s): CAROTID PTA/STENT INTERVENTION (Left) as a surgical intervention.  The patient's history has been reviewed, patient examined, no change in status, stable for surgery.  I have reviewed the patient's chart and labs.  Questions were answered to the patient's satisfaction.     Chae Shuster

## 2024-01-03 NOTE — Plan of Care (Signed)
  Problem: Education: Goal: Knowledge of discharge needs will improve Outcome: Progressing   Problem: Clinical Measurements: Goal: Postoperative complications will be avoided or minimized Outcome: Progressing   Problem: Respiratory: Goal: Will achieve and/or maintain a regular respiratory rate, without signs or symptoms of dyspnea Outcome: Progressing   Problem: Skin Integrity: Goal: Demonstration of wound healing without infection will improve Outcome: Progressing   Problem: Education: Goal: Knowledge of General Education information will improve Description: Including pain rating scale, medication(s)/side effects and non-pharmacologic comfort measures Outcome: Progressing   Problem: Health Behavior/Discharge Planning: Goal: Ability to manage health-related needs will improve Outcome: Progressing   Problem: Clinical Measurements: Goal: Ability to maintain clinical measurements within normal limits will improve Outcome: Progressing Goal: Will remain free from infection Outcome: Progressing Goal: Diagnostic test results will improve Outcome: Progressing Goal: Respiratory complications will improve Outcome: Progressing Goal: Cardiovascular complication will be avoided Outcome: Progressing   Problem: Activity: Goal: Risk for activity intolerance will decrease Outcome: Progressing   Problem: Nutrition: Goal: Adequate nutrition will be maintained Outcome: Progressing   Problem: Coping: Goal: Level of anxiety will decrease Outcome: Progressing   Problem: Elimination: Goal: Will not experience complications related to bowel motility Outcome: Progressing Goal: Will not experience complications related to urinary retention Outcome: Progressing

## 2024-01-04 DIAGNOSIS — Z9889 Other specified postprocedural states: Secondary | ICD-10-CM

## 2024-01-04 DIAGNOSIS — I6522 Occlusion and stenosis of left carotid artery: Principal | ICD-10-CM

## 2024-01-04 DIAGNOSIS — Z95828 Presence of other vascular implants and grafts: Secondary | ICD-10-CM

## 2024-01-04 LAB — CBC
HCT: 37.5 % — ABNORMAL LOW (ref 39.0–52.0)
Hemoglobin: 12.7 g/dL — ABNORMAL LOW (ref 13.0–17.0)
MCH: 32.7 pg (ref 26.0–34.0)
MCHC: 33.9 g/dL (ref 30.0–36.0)
MCV: 96.6 fL (ref 80.0–100.0)
Platelets: 280 10*3/uL (ref 150–400)
RBC: 3.88 MIL/uL — ABNORMAL LOW (ref 4.22–5.81)
RDW: 12.5 % (ref 11.5–15.5)
WBC: 8.5 10*3/uL (ref 4.0–10.5)
nRBC: 0 % (ref 0.0–0.2)

## 2024-01-04 LAB — BASIC METABOLIC PANEL WITH GFR
Anion gap: 7 (ref 5–15)
BUN: 19 mg/dL (ref 6–20)
CO2: 23 mmol/L (ref 22–32)
Calcium: 8.9 mg/dL (ref 8.9–10.3)
Chloride: 108 mmol/L (ref 98–111)
Creatinine, Ser: 0.85 mg/dL (ref 0.61–1.24)
GFR, Estimated: >60 mL/min (ref >60)
Glucose, Bld: 110 mg/dL — ABNORMAL HIGH (ref 70–99)
Potassium: 3.8 mmol/L (ref 3.5–5.1)
Sodium: 138 mmol/L (ref 135–145)

## 2024-01-04 LAB — GLUCOSE, CAPILLARY
Glucose-Capillary: 104 mg/dL — ABNORMAL HIGH (ref 70–99)
Glucose-Capillary: 137 mg/dL — ABNORMAL HIGH (ref 70–99)

## 2024-01-04 MED ORDER — DEXAMETHASONE SODIUM PHOSPHATE 4 MG/ML IJ SOLN
4.0000 mg | Freq: Once | INTRAMUSCULAR | Status: AC
  Administered 2024-01-04: 4 mg via INTRAVENOUS
  Filled 2024-01-04: qty 1

## 2024-01-04 NOTE — Progress Notes (Signed)
 Ambulated in hallway without assistance or complaint.  Rt groin unchanged.   Vital signs remain unchanged.  [place] aware

## 2024-01-04 NOTE — Discharge Summary (Signed)
 [facility]    Discharge Summary    Patient ID:  Marcus [name] MRN: [id number] DOB/AGE: 58-Feb-1967 58 y.o.  Admit date: 01/03/2024 Discharge date: 01/04/2024 Date of Surgery: 01/03/2024 Surgeon: Surgeon(s): Vonna Guardian Donald Frost, MD  Admission Diagnosis: Carotid stenosis, left [I65.22]  Discharge Diagnoses:  Carotid stenosis, left [I65.22]  Secondary Diagnoses: Past Medical History:  Diagnosis Date   CAD in native artery    a. 06/28/2014: STEMI, cath LM nl, mLAD 50%, mLCx 20%, OM1 small in size 80%, ramus 100% s/p PCI/DES 0%, EF 40%    Carotid artery disease (HCC)    CHF (congestive heart failure) (HCC)    Cocaine abuse (HCC)    Depression    H/O gastric ulcer    Hyperglycemia    a. A1C 5.6%   Hypertension    Ischemic cardiomyopathy    a. EF 40% by cath 06/28/2014   MI (myocardial infarction) (HCC)    Polysubstance abuse (HCC)    a. cocaine, tobacco, and alcohol    Tobacco abuse     Procedure(s): CAROTID PTA/STENT INTERVENTION  Discharged Condition: good  HPI:  [name] is a 58 yo male now POD #1 from endovascular stent placement to left carotid artery. He was noted to have  high grade left carotid artery stenosis.  The patient has done well with carotid stenting on the right previously having done well with that and carotid artery stenting was felt to be preferred to endarterectomy for that reason.   PROCEDURE:  Ultrasound guidance for vascular access right femoral artery  Placement of a 9 mm proximal 7 mm distal 4 cm long Exact stent with the use of the NAV-6 embolic protection device in the left carotid artery   Hospital Course:  Marcus [name] is a 58 y.o. male is S/P Left Endovascular Carotid stent placement. Patient recovering as expected. Patient had headache this morning post procedure that was not relieved with pain medication. He was given 4 mg decadron  IV. One hour later headache had resolved. Patient ambulated without any  difficulties. Patient eating and urinating well. Patient wishes to go home. Patient to be discharged later today.   Patient is being discharged on ASA 81 mg daily, Eliquis  5 mg twice daily and Crestor  40 mg daily. Patient was counseled to not miss or skip taking any of these medications as it will alter the outcome of his procedure. He verbalized his understanding.  I spent more than 60 minutes in preparation, teaching and completing this discharge today    Extubated: POD # 0 Physical Exam:  Alert notes x3, no acute distress Face: Symmetrical.  Tongue is midline. Neck: Trachea is midline.  No swelling or bruising. Cardiovascular: Regular rate and rhythm Pulmonary: Clear to auscultation bilaterally Abdomen: Soft, nontender, nondistended Right groin access: Clean dry and intact.  No swelling or drainage noted Left lower extremity: Thigh soft.  Calf soft.  Extremities warm distally toes.  Hard to palpate pedal pulses however the foot is warm is her good capillary refill. Right lower extremity: Thigh soft.  Calf soft.  Extremities warm distally toes.  Hard to palpate pedal pulses however the foot is warm is her good capillary refill. Neurological: No deficits noted   Post-op wounds:  clean, dry, intact or healing well  Pt. Ambulating, voiding and taking PO diet without difficulty. Pt pain controlled with PO pain meds.  Labs:  As below  Complications: none  Consults:    Significant Diagnostic Studies: CBC Lab  Results  Component Value Date   WBC 8.5 01/04/2024   HGB 12.7 (L) 01/04/2024   HCT 37.5 (L) 01/04/2024   MCV 96.6 01/04/2024   PLT 280 01/04/2024    BMET    Component Value Date/Time   NA 138 01/04/2024 0414   NA 139 06/29/2014 0353   K 3.8 01/04/2024 0414   K 3.6 06/29/2014 0353   CL 108 01/04/2024 0414   CL 107 06/29/2014 0353   CO2 23 01/04/2024 0414   CO2 25 06/29/2014 0353   GLUCOSE 110 (H) 01/04/2024 0414   GLUCOSE 109 (H) 06/29/2014 0353   BUN 19  01/04/2024 0414   BUN 8 06/29/2014 0353   CREATININE 0.85 01/04/2024 0414   CREATININE 0.92 06/29/2014 0353   CALCIUM  8.9 01/04/2024 0414   CALCIUM  8.6 06/29/2014 0353   GFRNONAA >60 01/04/2024 0414   GFRNONAA >60 06/29/2014 0353   GFRAA >60 10/19/2016 1238   GFRAA >60 06/29/2014 0353   COAG Lab Results  Component Value Date   INR 1.1 12/06/2023   INR 0.9 08/19/2023   INR 0.9 06/28/2014     Disposition:  Discharge to :Home  Allergies as of 01/04/2024       Reactions   Oxycodone-acetaminophen  Other (See Comments)   Codeine Rash        Medication List     STOP taking these medications    clopidogrel  75 MG tablet Commonly known as: PLAVIX        TAKE these medications    apixaban  5 MG Tabs tablet Commonly known as: ELIQUIS  Take 1 tablet (5 mg total) by mouth 2 (two) times daily.   aspirin  EC 81 MG tablet Take 81 mg by mouth daily. Swallow whole.   DULoxetine  30 MG capsule Commonly known as: CYMBALTA  Take 1 capsule (30 mg total) by mouth daily.   ezetimibe  10 MG tablet Commonly known as: ZETIA  Take 1 tablet (10 mg total) by mouth daily.   hydrOXYzine  25 MG capsule Commonly known as: VISTARIL  Take 25 mg by mouth 2 (two) times daily.   irbesartan  300 MG tablet Commonly known as: AVAPRO  Take 1 tablet (300 mg total) by mouth daily.   isosorbide  mononitrate 60 MG 24 hr tablet Commonly known as: IMDUR  Take 1 tablet (60 mg total) by mouth 2 (two) times daily.   metoprolol  succinate 25 MG 24 hr tablet Commonly known as: TOPROL -XL Take 3 tablets (75 mg total) by mouth daily. Take with or immediately following a meal.   mometasone -formoterol  200-5 MCG/ACT Aero Commonly known as: DULERA  Inhale 2 puffs into the lungs 2 (two) times daily.   multivitamin with minerals Tabs tablet Take 1 tablet by mouth daily.   nicotine  21 mg/24hr patch Commonly known as: NICODERM CQ  - dosed in mg/24 hours Place 21 mg onto the skin daily as needed.   nitroGLYCERIN   0.4 MG SL tablet Commonly known as: NITROSTAT  Place 1 tablet (0.4 mg total) under the tongue every 5 (five) minutes as needed for chest pain.   pantoprazole  40 MG tablet Commonly known as: PROTONIX  Take 1 tablet (40 mg total) by mouth daily.   QUEtiapine  200 MG tablet Commonly known as: SEROQUEL  Take 1 tablet (200 mg total) by mouth at bedtime.   rosuvastatin  40 MG tablet Commonly known as: CRESTOR  Take 1 tablet (40 mg total) by mouth daily.   Spiriva  Respimat 1.25 MCG/ACT Aers Generic drug: Tiotropium Bromide Monohydrate  Inhale 2 sprays into the lungs daily.   Ventolin  HFA 108 (90 Base) MCG/ACT inhaler  Generic drug: albuterol  Inhale 1-2 puffs into the lungs every 4 (four) hours as needed for shortness of breath or wheezing. SMARTSIG:2 Puff(s) By Mouth Every 4 Hours PRN       Verbal and written Discharge instructions given to the patient. Wound care per Discharge AVS  Follow-up Information     Dew, Donald Frost, MD Follow up in 4 week(s).   Specialties: Vascular Surgery, Radiology, Interventional Cardiology Why: Bilateral Carotid Ultrasounds Contact information: [address] [place] [address] [phone]                 Signed: Annamaria Barrette, NP  01/04/2024, 11:23 AM

## 2024-01-04 NOTE — Discharge Instructions (Addendum)
 Vascular Surgery Discharge Instruction:  Do not lift anything heavy for the next 2 weeks.  Do not lift anything more than a gallon of milk.  Do not drive for the next week.  Do not drive while taking pain medication.  You may shower tomorrow when you get home on 01/05/2024.  Shower with the old dressing in place and remove immediately after showering.  Pat dry the area and place a Band-Aid to the incision site for the next 3 days.  You are currently being discharged on aspirin  81 mg daily, Eliquis  5 mg daily and Crestor  40 mg daily.  We recommend you do not miss or skip taking any of these medications as it will alter the outcome of your procedure.  Follow-up with vein and vascular surgery as scheduled.

## 2024-01-20 ENCOUNTER — Other Ambulatory Visit: Payer: Self-pay | Admitting: Medical

## 2024-01-20 ENCOUNTER — Other Ambulatory Visit (INDEPENDENT_AMBULATORY_CARE_PROVIDER_SITE_OTHER): Payer: Self-pay | Admitting: Vascular Surgery

## 2024-02-01 ENCOUNTER — Other Ambulatory Visit: Payer: Self-pay

## 2024-02-01 ENCOUNTER — Encounter: Payer: Self-pay | Admitting: Medical Oncology

## 2024-02-01 ENCOUNTER — Emergency Department
Admission: EM | Admit: 2024-02-01 | Discharge: 2024-02-02 | Disposition: A | Discharge status: Home / Self Care | Attending: Emergency Medicine | Admitting: Emergency Medicine

## 2024-02-01 DIAGNOSIS — Z79899 Other long term (current) drug therapy: Secondary | ICD-10-CM | POA: Diagnosis not present

## 2024-02-01 DIAGNOSIS — R45851 Suicidal ideations: Secondary | ICD-10-CM | POA: Insufficient documentation

## 2024-02-01 DIAGNOSIS — F1994 Other psychoactive substance use, unspecified with psychoactive substance-induced mood disorder: Secondary | ICD-10-CM | POA: Insufficient documentation

## 2024-02-01 DIAGNOSIS — Y908 Blood alcohol level of 240 mg/100 ml or more: Secondary | ICD-10-CM | POA: Insufficient documentation

## 2024-02-01 DIAGNOSIS — Z046 Encounter for general psychiatric examination, requested by authority: Secondary | ICD-10-CM

## 2024-02-01 DIAGNOSIS — N179 Acute kidney failure, unspecified: Secondary | ICD-10-CM | POA: Diagnosis not present

## 2024-02-01 DIAGNOSIS — F10920 Alcohol use, unspecified with intoxication, uncomplicated: Secondary | ICD-10-CM | POA: Diagnosis not present

## 2024-02-01 LAB — CBC
HCT: 42.7 % (ref 39.0–52.0)
Hemoglobin: 14.8 g/dL (ref 13.0–17.0)
MCH: 33.4 pg (ref 26.0–34.0)
MCHC: 34.7 g/dL (ref 30.0–36.0)
MCV: 96.4 fL (ref 80.0–100.0)
Platelets: 530 K/uL — ABNORMAL HIGH (ref 150–400)
RBC: 4.43 MIL/uL (ref 4.22–5.81)
RDW: 12.5 % (ref 11.5–15.5)
WBC: 13.1 K/uL — ABNORMAL HIGH (ref 4.0–10.5)
nRBC: 0 % (ref 0.0–0.2)

## 2024-02-01 LAB — COMPREHENSIVE METABOLIC PANEL WITH GFR
ALT: 32 U/L (ref 0–44)
AST: 43 U/L — ABNORMAL HIGH (ref 15–41)
Albumin: 4.3 g/dL (ref 3.5–5.0)
Alkaline Phosphatase: 78 U/L (ref 38–126)
Anion gap: 17 — ABNORMAL HIGH (ref 5–15)
BUN: 37 mg/dL — ABNORMAL HIGH (ref 6–20)
CO2: 19 mmol/L — ABNORMAL LOW (ref 22–32)
Calcium: 10.1 mg/dL (ref 8.9–10.3)
Chloride: 109 mmol/L (ref 98–111)
Creatinine, Ser: 2.13 mg/dL — ABNORMAL HIGH (ref 0.61–1.24)
GFR, Estimated: 35 mL/min — ABNORMAL LOW (ref >60)
Glucose, Bld: 123 mg/dL — ABNORMAL HIGH (ref 70–99)
Potassium: 4 mmol/L (ref 3.5–5.1)
Sodium: 145 mmol/L (ref 135–145)
Total Bilirubin: 0.8 mg/dL (ref 0.0–1.2)
Total Protein: 8 g/dL (ref 6.5–8.1)

## 2024-02-01 LAB — ETHANOL: Alcohol, Ethyl (B): 280 mg/dL — ABNORMAL HIGH (ref <15)

## 2024-02-01 MED ORDER — DIPHENHYDRAMINE HCL 50 MG/ML IJ SOLN
50.0000 mg | Freq: Once | INTRAMUSCULAR | Status: AC
  Administered 2024-02-01: 50 mg via INTRAMUSCULAR

## 2024-02-01 MED ORDER — LORAZEPAM 2 MG/ML IJ SOLN
2.0000 mg | Freq: Once | INTRAMUSCULAR | Status: AC
  Administered 2024-02-01: 2 mg via INTRAMUSCULAR

## 2024-02-01 MED ORDER — HALOPERIDOL LACTATE 5 MG/ML IJ SOLN
5.0000 mg | Freq: Once | INTRAMUSCULAR | Status: AC
  Administered 2024-02-01: 5 mg via INTRAMUSCULAR

## 2024-02-01 NOTE — ED Notes (Signed)
INVOLUNTARY with all papers on chart/awaiting TTS/PSYCH consult ?

## 2024-02-01 NOTE — ED Triage Notes (Signed)
 Pt from home via [facility] under IVC, pt was throwing items into the road, police was called when they arrived pt had drank a pint of [employer] liquor and 12 pack of beer. Pt told sheriff that he was going to throw himself into the traffic to kill himself. Pt arrives to ED yelling, aggressive, swinging and kicking.

## 2024-02-01 NOTE — ED Notes (Signed)
 Patient brought in by sheriff department in forensic handcuffs. Patient verbally abusive to officers and attempting to get in their faces stating I'm gonna kill you mother fuckers. MD Jossie at bedside as well as this RN and Powell, Charity fundraiser. Staff attempted multiple times to verbally deescalate patient, unsuccessful. Patient began cussing at [facility] and continues to be verbally aggressive and threatening. IM medications ordered. Patient allowed this RN and heather RN to give IM injections while he was still in forensic handcuffs. Patient continued to cuss at staff after injections, but eventually allowed this RN to dress him out into hospital scrubs per protocol. Belongings include: 1 shorts, 2 socks, 1 underwear, spare change equaling less than 2 dollars, 1 black wallet.   Officers removed forensic cuffs and patient agreed to lay down and let this RN obtain blood work.

## 2024-02-01 NOTE — BH Assessment (Signed)
 Patient was observed to be snoring loudly and was unresponsive, rendering participation in the assessment impossible. TTS will follow up with assessment at later time

## 2024-02-01 NOTE — ED Provider Notes (Signed)
 [facility] Provider Note   Event Date/Time   First MD Initiated Contact with Patient 02/01/24 1712     (approximate) History  IVC  HPI Marcus [name] is a 58 y.o. male with a past medical history of hypertension, depression, CHF, CAD, and polysubstance abuse who presents via law enforcement under involuntary commitment due to suicidal ideation and alcohol intoxication.  Patient was allegedly throwing items into the street and stating that he wanted to die by jumping in front of traffic.  Bystanders state that patient has had at least 1.5L of whiskey today.  Patient is uncooperative with history or exam ROS: Unable to assess   Physical Exam  Triage Vital Signs: ED Triage Vitals  Encounter Vitals Group     BP      Girls Systolic BP Percentile      Girls Diastolic BP Percentile      Boys Systolic BP Percentile      Boys Diastolic BP Percentile      Pulse      Resp      Temp      Temp src      SpO2      Weight      Height      Head Circumference      Peak Flow      Pain Score      Pain Loc      Pain Education      Exclude from Growth Chart    Most recent vital signs: Vitals:   02/01/24 1730  BP: (!) 144/90  Pulse: 90  Resp: 19  Temp: 98 F (36.7 C)  SpO2: 99%   General: Awake, oriented x4. CV:  Good peripheral perfusion. Resp:  Normal effort. Abd:  No distention. Other:  Middle-aged disheveled overweight Caucasian male handcuffed behind his back ED Results / Procedures / Treatments  Labs (all labs ordered are listed, but only abnormal results are displayed) Labs Reviewed  COMPREHENSIVE METABOLIC PANEL WITH GFR - Abnormal; Notable for the following components:      Result Value   CO2 19 (*)    Glucose, Bld 123 (*)    BUN 37 (*)    Creatinine, Ser 2.13 (*)    AST 43 (*)    GFR, Estimated 35 (*)    Anion gap 17 (*)    All other components within normal limits  ETHANOL - Abnormal; Notable for the following components:   Alcohol,  Ethyl (B) 280 (*)    All other components within normal limits  CBC - Abnormal; Notable for the following components:   WBC 13.1 (*)    Platelets 530 (*)    All other components within normal limits  URINE DRUG SCREEN, QUALITATIVE ([facility] ONLY)   PROCEDURES: Critical Care performed: No Procedures MEDICATIONS ORDERED IN ED: Medications  LORazepam  (ATIVAN ) injection 2 mg (2 mg Intramuscular Given 02/01/24 1729)  diphenhydrAMINE  (BENADRYL ) injection 50 mg (50 mg Intramuscular Given 02/01/24 1730)  haloperidol  lactate (HALDOL ) injection 5 mg (5 mg Intramuscular Given 02/01/24 1730)   IMPRESSION / MDM / ASSESSMENT AND PLAN / ED COURSE  I reviewed the triage vital signs and the nursing notes.                             The patient is on the cardiac monitor to evaluate for evidence of arrhythmia and/or significant heart rate changes. Patient's presentation is most consistent with  acute presentation with potential threat to life or bodily function. Thoughts are linear and organized, and patient has no AH, VH, or HI. Prior suicide attempt by overdose Prior Psychiatric Hospitalizations: Multiple  Clinically patient displays no overt toxidrome; they are well appearing, with low suspicion for toxic ingestion given history and exam. Thoughts unlikely 2/2 anemia, hypothyroidism, infection, or ICH.  Consult: Psychiatry to evaluate patient for potential hold for danger to self. Disposition: Care of this patient will be signed out to the oncoming physician at the end of my shift.  All pertinent patient information conveyed and all questions answered.  All further care and disposition decisions will be made by the oncoming physician.   FINAL CLINICAL IMPRESSION(S) / ED DIAGNOSES   Final diagnoses:  Alcoholic intoxication without complication (HCC)  Suicidal ideation  Involuntary commitment   Rx / DC Orders   ED Discharge Orders     None      Note:  This document was prepared using Dragon  voice recognition software and may include unintentional dictation errors.   Shakiah Wester K, MD 02/01/24 2159

## 2024-02-01 NOTE — ED Notes (Signed)
 Patient yelling out in room with eyes closed. Writer attempted to speak with patient and patient did not respond.

## 2024-02-02 ENCOUNTER — Other Ambulatory Visit (INDEPENDENT_AMBULATORY_CARE_PROVIDER_SITE_OTHER): Payer: Self-pay | Admitting: Vascular Surgery

## 2024-02-02 DIAGNOSIS — F1994 Other psychoactive substance use, unspecified with psychoactive substance-induced mood disorder: Secondary | ICD-10-CM | POA: Diagnosis not present

## 2024-02-02 DIAGNOSIS — I6523 Occlusion and stenosis of bilateral carotid arteries: Secondary | ICD-10-CM

## 2024-02-02 LAB — BASIC METABOLIC PANEL WITH GFR
Anion gap: 10 (ref 5–15)
BUN: 36 mg/dL — ABNORMAL HIGH (ref 6–20)
CO2: 24 mmol/L (ref 22–32)
Calcium: 9.3 mg/dL (ref 8.9–10.3)
Chloride: 109 mmol/L (ref 98–111)
Creatinine, Ser: 1.22 mg/dL (ref 0.61–1.24)
GFR, Estimated: >60 mL/min (ref >60)
Glucose, Bld: 95 mg/dL (ref 70–99)
Potassium: 4.3 mmol/L (ref 3.5–5.1)
Sodium: 143 mmol/L (ref 135–145)

## 2024-02-02 MED ORDER — ROSUVASTATIN CALCIUM 20 MG PO TABS
40.0000 mg | ORAL_TABLET | Freq: Every day | ORAL | Status: DC
  Administered 2024-02-02: 40 mg via ORAL
  Filled 2024-02-02: qty 2

## 2024-02-02 MED ORDER — APIXABAN 5 MG PO TABS
5.0000 mg | ORAL_TABLET | Freq: Two times a day (BID) | ORAL | Status: DC
  Administered 2024-02-02: 5 mg via ORAL
  Filled 2024-02-02: qty 1

## 2024-02-02 MED ORDER — PANTOPRAZOLE SODIUM 40 MG PO TBEC
40.0000 mg | DELAYED_RELEASE_TABLET | Freq: Every day | ORAL | Status: DC
  Administered 2024-02-02: 40 mg via ORAL
  Filled 2024-02-02: qty 1

## 2024-02-02 MED ORDER — METOPROLOL SUCCINATE ER 50 MG PO TB24
75.0000 mg | ORAL_TABLET | Freq: Every day | ORAL | Status: DC
  Administered 2024-02-02: 75 mg via ORAL
  Filled 2024-02-02: qty 1

## 2024-02-02 MED ORDER — IRBESARTAN 150 MG PO TABS
300.0000 mg | ORAL_TABLET | Freq: Every day | ORAL | Status: DC
  Administered 2024-02-02: 300 mg via ORAL
  Filled 2024-02-02: qty 2

## 2024-02-02 MED ORDER — QUETIAPINE FUMARATE 200 MG PO TABS: 200.0000 mg | ORAL_TABLET | Freq: Every day | ORAL | Status: DC

## 2024-02-02 MED ORDER — DULOXETINE HCL 30 MG PO CPEP
30.0000 mg | ORAL_CAPSULE | Freq: Every day | ORAL | Status: DC
  Administered 2024-02-02: 30 mg via ORAL
  Filled 2024-02-02: qty 1

## 2024-02-02 NOTE — ED Notes (Signed)
 Pt given pedialyte and education provided about oral rehydration.

## 2024-02-02 NOTE — ED Notes (Signed)
 Pt denies headache. Pt reports he does have intermittent blurry vision that is normal for him.

## 2024-02-02 NOTE — ED Notes (Signed)
 Marcus [name] Lum consult pending

## 2024-02-02 NOTE — Progress Notes (Addendum)
 No charge note.   Attempt made to evaluate [name] via [place]. Unfortunately, at this time he is not forthcoming in response to interview questions to evaluate reported SI and continues to reply I'd prefer not to discuss it.   Medical workup ongoing. AKI noted. Creatinine 2.13 yesterday evening with BUN 37. 4 weeks ago Creatinine 0.85 and BUN 19.  Discussed above with EDP Dr Waymond via secure chat. Leontine, ED RN updated via secure chat regarding need for reassessment today.  On site psych team will follow up with an in person evaluation.  This document was prepared using Dragon voice recognition software and may include unintentional dictation errors.  Rockie Rams DNP, MBA, PMHNP-BC, FNP-BC  Psychiatric Mental Health Nurse Practitioner [facility]

## 2024-02-02 NOTE — ED Notes (Signed)
Pt given breakfast tray and beverage.  

## 2024-02-02 NOTE — ED Notes (Signed)
 Pt asked RN to call Koren Beat to come pick him up. RN successfully called.

## 2024-02-02 NOTE — ED Provider Notes (Addendum)
 Emergency Medicine Observation Re-evaluation Note  Marcus [name] is a 58 y.o. male, seen on rounds today.  Pt initially presented to the ED for complaints of IVC Currently, the patient is resting.  Physical Exam  BP (!) 153/79   Pulse 90   Temp 97.8 F (36.6 C) (Oral)   Resp 19   Ht 5' 3 (1.6 m)   Wt 72 kg   SpO2 100%   BMI 28.12 kg/m  Physical Exam .Gen:  No acute distress Resp:  Breathing easily and comfortably, no accessory muscle usage Neuro:  Moving all four extremities, no gross focal neuro deficits Psych:  Resting currently, calm when awake   ED Course / MDM  EKG:   I have reviewed the labs performed to date as well as medications administered while in observation.  Recent changes in the last 24 hours include blood pressures are elevated since patient has not been taking his medications for 2 days, has history of chronic hypertension, no other complaints, labs from yesterday showed AKI.  Plan  Current plan is for psych dispo, reordered his home medications, PO hydration, redraw Cr to make sure downtrending.  11:57 AM: Psychiatry evaluated patient, states he is sober, no SI or HI, is rescinding his IVC.  Repeat BMP creatinine is 1.2.  Will discharge with instructions to follow-up with his primary care doctor to get repeat labs in a week, encouraged hydration, encourage avoidance of alcohol.  Considered but no indication for inpatient admission at this time, he safe for outpatient management.  Will discharge with strict return precautions.   Waymond Lorelle Cummins, MD 02/02/24 [id number]    Waymond Lorelle Cummins, MD 02/02/24 [phone]

## 2024-02-02 NOTE — ED Notes (Signed)
 IVC PAPERS  RESCINDED  PT  NOW  VOL

## 2024-02-02 NOTE — Discharge Instructions (Addendum)
 Please make sure to follow-up with primary care doctor to get repeat kidney function testing done in a week.  Please make sure to keep her self hydrated.  Please make sure to take your medications as prescribed.  Please avoid alcohol use.

## 2024-02-02 NOTE — ED Notes (Signed)
 MD made aware pt's BP 206/105. Pt reports having chronic HTN and has not taken his medication in 2 days.
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
## 2024-02-02 NOTE — BH Assessment (Signed)
 Comprehensive Clinical Assessment (CCA) Screening, Triage and Referral Note  02/02/2024 Marcus [name] [id number]  Disposition: Per Dr. Donnelly, pt is psych cleared and is to follow up with outpatient services.    Per EDP's note:  Pt is a 58 y.o. male with a past medical history of hypertension, depression, CHF, CAD, and polysubstance abuse who presents via law enforcement under involuntary commitment due to suicidal ideation and alcohol intoxication.  Patient was allegedly throwing items into the street and stating that he wanted to die by jumping in front of traffic.  Bystanders state that patient has had at least 1.5L of whiskey today.  Patient is uncooperative with history or exam  Upon evaluation with this clinician, the patient is alert, oriented x 3, and cooperative. Speech is clear, coherent and logical. Pt appears casual. Eye contact is fair. Mood is normal; affect is congruent with mood. The thought process is logical and thought content is coherent. Pt reports that yesterday he was drinking heavily due to stress related to his roommate. Pt reports drinking 20-30 beers yesterday. Pt reports that yesterday he stated he could jump into traffic but did not have the intent to do so. Pt denies SI/HI/AVH. There is no indication that the patient is responding to internal stimuli. No delusions elicited during this assessment.       Chief Complaint:  Chief Complaint  Patient presents with   IVC   Visit Diagnosis: Alcohol Use   Patient Reported Information How did you hear about us ? -- [facility] ED)  What Is the Reason for Your Visit/Call Today? Per EDP's note:  Pt is a 58 y.o. male with a past medical history of hypertension, depression, CHF, CAD, and polysubstance abuse who presents via law enforcement under involuntary commitment due to suicidal ideation and alcohol intoxication.  Patient was allegedly throwing items into the street and stating that he wanted to die by jumping in front  of traffic.  Bystanders state that patient has had at least 1.5L of whiskey today.  Patient is uncooperative with history or exam  How Long Has This Been Causing You Problems? > than 6 months  What Do You Feel Would Help You the Most Today? Alcohol or Drug Use Treatment; Treatment for Depression or other mood problem; Stress Management; Medication(s)   Have You Recently Had Any Thoughts About Hurting Yourself? No  Are You Planning to Commit Suicide/Harm Yourself At This time? No   Have you Recently Had Thoughts About Hurting Someone [place]? No  Are You Planning to Harm Someone at This Time? No  Explanation: Pt denies HI   Have You Used Any Alcohol or Drugs in the Past 24 Hours? Yes  How Long Ago Did You Use Drugs or Alcohol? Yesterday  What Did You Use and How Much? Alcohol : 20-30 beers   Do You Currently Have a Therapist/Psychiatrist? No  Name of Therapist/Psychiatrist: n/a  Have You Been Recently Discharged From Any Office Practice or Programs? No  Explanation of Discharge From Practice/Program: n/a   CCA Screening Triage Referral Assessment Type of Contact: Face to Face  Telemedicine Service Delivery:   Is this Initial or Reassessment?   Date Telepsych consult ordered in [facility]:    Time Telepsych consult ordered in [facility]:    Location of Assessment: [facility] ED  Provider Location: [facility] ED    Collateral Involvement: None   Does Patient Have a Court Appointed Legal Guardian? N/A Name and Contact of Legal Guardian: n/a  If Minor and Not Living  with Parent(s), Who has Custody? n/a  Is CPS involved or ever been involved? Never  Is APS involved or ever been involved? Never   Patient Determined To Be At Risk for Harm To Self or Others Based on Review of Patient Reported Information or Presenting Complaint? No (Per chart, pt made SI threats but he is currently not suicidal)  Method: No Plan  Availability of Means: No access or NA  Intent: Vague intent or  NA  Notification Required: No need or identified person  Additional Information for Danger to Others Potential: -- (n/a)  Additional Comments for Danger to Others Potential: n/a  Are There Guns or Other Weapons in Your Home? No  Types of Guns/Weapons: Denies access  Are These Weapons Safely Secured?                            No  Who Could Verify You Are Able To Have These Secured: n/a  Do You Have any Outstanding Charges, Pending Court Dates, Parole/Probation? Denies pending legal charges  Contacted To Inform of Risk of Harm To Self or Others: -- (n/a)   Does Patient Present under Involuntary Commitment? Yes    [place] of Residence: [place]   Patient Currently Receiving the Following Services: Not Receiving Services   Determination of Need: Routine (7 days)   Options For Referral: Medication Management; Outpatient Therapy   Disposition Recommendation per psychiatric provider: There are no psychiatric contraindications to discharge at this time  Rosina PARAS, [place], [facility] Triage Specialist

## 2024-02-08 ENCOUNTER — Ambulatory Visit (INDEPENDENT_AMBULATORY_CARE_PROVIDER_SITE_OTHER): Admitting: Vascular Surgery

## 2024-02-08 ENCOUNTER — Encounter (INDEPENDENT_AMBULATORY_CARE_PROVIDER_SITE_OTHER)

## 2024-02-10 ENCOUNTER — Ambulatory Visit (INDEPENDENT_AMBULATORY_CARE_PROVIDER_SITE_OTHER): Admitting: Nurse Practitioner

## 2024-02-10 ENCOUNTER — Ambulatory Visit (INDEPENDENT_AMBULATORY_CARE_PROVIDER_SITE_OTHER)

## 2024-02-10 ENCOUNTER — Encounter (INDEPENDENT_AMBULATORY_CARE_PROVIDER_SITE_OTHER): Payer: Self-pay | Admitting: Nurse Practitioner

## 2024-02-10 VITALS — BP 169/96 | HR 109 | Ht 63.0 in | Wt 159.2 lb

## 2024-02-10 DIAGNOSIS — I6523 Occlusion and stenosis of bilateral carotid arteries: Secondary | ICD-10-CM

## 2024-02-10 DIAGNOSIS — E782 Mixed hyperlipidemia: Secondary | ICD-10-CM

## 2024-02-10 DIAGNOSIS — I10 Essential (primary) hypertension: Secondary | ICD-10-CM

## 2024-02-10 NOTE — Progress Notes (Signed)
 Subjective:    Patient ID: Marcus [name], male    DOB: 1966-04-30, 58 y.o.   MRN: [id number] Chief Complaint  Patient presents with   Follow-Ups: Follow up with Marea Selinda RAMAN, MD (Vascular Surge    The patient is seen for follow up evaluation of carotid stenosis status post left carotid ICA stent on 01/03/2024.  He previously had a right carotid stent placed on 08/31/2022 there were no post operative problems or complications related to the surgery.  The patient denies neck or incisional pain.  The patient denies interval amaurosis fugax. There is no recent history of TIA symptoms or focal motor deficits. There is no prior documented CVA.  The patient denies headache.  The patient is taking enteric-coated aspirin  81 mg daily.  No recent shortening of the patient's walking distance or new symptoms consistent with claudication.  No history of rest pain symptoms. No new ulcers or wounds of the lower extremities have occurred.  There is no history of DVT, PE or superficial thrombophlebitis. No recent episodes of angina or shortness of breath documented.   Today he has 1 to 39% stenosis bilaterally.  No significant stenosis noted in the carotid stents.  Antegrade flow in the right vertebral artery with known occlusion in the left    Review of Systems  All other systems reviewed and are negative.      Objective:   Physical Exam Vitals reviewed.  HENT:     Head: Normocephalic.  Neck:     Vascular: Carotid bruit present.  Cardiovascular:     Rate and Rhythm: Normal rate and regular rhythm.     Pulses: Normal pulses.  Pulmonary:     Effort: Pulmonary effort is normal.  Skin:    General: Skin is warm and dry.  Neurological:     Mental Status: He is alert and oriented to person, place, and time.  Psychiatric:        Mood and Affect: Mood normal.        Behavior: Behavior normal.        Thought Content: Thought content normal.        Judgment: Judgment normal.     BP (!)  169/96   Pulse (!) 109   Ht 5' 3 (1.6 m)   Wt 159 lb 4 oz (72.2 kg)   BMI 28.21 kg/m   Past Medical History:  Diagnosis Date   CAD in native artery    a. 06/28/2014: STEMI, cath LM nl, mLAD 50%, mLCx 20%, OM1 small in size 80%, ramus 100% s/p PCI/DES 0%, EF 40%    Carotid artery disease (HCC)    CHF (congestive heart failure) (HCC)    Cocaine abuse (HCC)    Depression    H/O gastric ulcer    Hyperglycemia    a. A1C 5.6%   Hypertension    Ischemic cardiomyopathy    a. EF 40% by cath 06/28/2014   MI (myocardial infarction) (HCC)    Polysubstance abuse (HCC)    a. cocaine, tobacco, and alcohol    Tobacco abuse     Social History   Socioeconomic History   Marital status: Single    Spouse name: Not on file   Number of children: Not on file   Years of education: Not on file   Highest education level: Not on file  Occupational History   Not on file  Tobacco Use   Smoking status: Every Day    Current packs/day: 1.00  Average packs/day: 1 pack/day for 48.6 years (48.6 ttl pk-yrs)    Types: Cigarettes    Start date: 07/21/1975   Smokeless tobacco: Never   Tobacco comments:    Pt. Is actively trying to quit and is wearing the patch.   Vaping Use   Vaping status: Never Used  Substance and Sexual Activity   Alcohol use: Yes    Alcohol/week: 12.0 standard drinks of alcohol    Types: 12 Cans of beer per week   Drug use: No    Types: Cocaine, Marijuana    Comment: none in at least 2 years   Sexual activity: Not on file  Other Topics Concern   Not on file  Social History Narrative   Not on file   Social Drivers of Health   Financial Resource Strain: Not on file  Food Insecurity: No Food Insecurity (01/03/2024)   Hunger Vital Sign    Worried About Running Out of Food in the Last Year: Never true    Ran Out of Food in the Last Year: Never true  Transportation Needs: No Transportation Needs (01/03/2024)   PRAPARE - Administrator, Civil Service (Medical):  No    Lack of Transportation (Non-Medical): No  Physical Activity: Not on file  Stress: Not on file  Social Connections: Not on file  Intimate Partner Violence: Not At Risk (01/03/2024)   Humiliation, Afraid, Rape, and Kick questionnaire    Fear of Current or Ex-Partner: No    Emotionally Abused: No    Physically Abused: No    Sexually Abused: No    Past Surgical History:  Procedure Laterality Date   CARDIAC CATHETERIZATION  06/28/2014   x1 stent   CAROTID PTA/STENT INTERVENTION Right 08/31/2022   Procedure: CAROTID PTA/STENT INTERVENTION;  Surgeon: Marea Selinda RAMAN, MD;  Location: [facility];  Service: Cardiovascular;  Laterality: Right;   CAROTID PTA/STENT INTERVENTION Left 01/03/2024   Procedure: CAROTID PTA/STENT INTERVENTION;  Surgeon: Marea Selinda RAMAN, MD;  Location: [facility];  Service: Cardiovascular;  Laterality: Left;   COLONOSCOPY N/A 08/12/2022   Procedure: COLONOSCOPY;  Surgeon: Toledo, Ladell POUR, MD;  Location: [facility];  Service: Gastroenterology;  Laterality: N/A;   CORONARY STENT INTERVENTION N/A 08/26/2022   Procedure: CORONARY STENT INTERVENTION;  Surgeon: Mady Bruckner, MD;  Location: [facility];  Service: Cardiovascular;  Laterality: N/A;   ESOPHAGOGASTRODUODENOSCOPY N/A 08/12/2022   Procedure: ESOPHAGOGASTRODUODENOSCOPY (EGD);  Surgeon: Toledo, Ladell POUR, MD;  Location: [facility];  Service: Gastroenterology;  Laterality: N/A;   LEFT HEART CATH AND CORONARY ANGIOGRAPHY Left 08/26/2022   Procedure: LEFT HEART CATH AND CORONARY ANGIOGRAPHY;  Surgeon: Mady Bruckner, MD;  Location: [facility];  Service: Cardiovascular;  Laterality: Left;   NISSEN FUNDOPLICATION     stomach ulcer surgery      Family History  Problem Relation Age of Onset   Heart failure Mother    Stroke Father    Heart failure Father    Prostate cancer Brother    Heart attack Brother     Allergies  Allergen Reactions   Oxycodone-Acetaminophen  Other  (See Comments)   Codeine Rash       Latest Ref Rng & Units 02/01/2024    5:34 PM 01/04/2024    4:14 AM 12/06/2023    8:25 AM  CBC  WBC 4.0 - 10.5 K/uL 13.1  8.5  10.9   Hemoglobin 13.0 - 17.0 g/dL 85.1  87.2  83.5   Hematocrit 39.0 -  52.0 % 42.7  37.5  49.0   Platelets 150 - 400 K/uL 530  280  199       CMP     Component Value Date/Time   NA 143 02/02/2024 1117   NA 139 06/29/2014 0353   K 4.3 02/02/2024 1117   K 3.6 06/29/2014 0353   CL 109 02/02/2024 1117   CL 107 06/29/2014 0353   CO2 24 02/02/2024 1117   CO2 25 06/29/2014 0353   GLUCOSE 95 02/02/2024 1117   GLUCOSE 109 (H) 06/29/2014 0353   BUN 36 (H) 02/02/2024 1117   BUN 8 06/29/2014 0353   CREATININE 1.22 02/02/2024 1117   CREATININE 0.92 06/29/2014 0353   CALCIUM  9.3 02/02/2024 1117   CALCIUM  8.6 06/29/2014 0353   PROT 8.0 02/01/2024 1734   PROT 7.6 06/28/2014 0718   ALBUMIN 4.3 02/01/2024 1734   ALBUMIN 3.7 06/28/2014 0718   AST 43 (H) 02/01/2024 1734   AST 257 (H) 06/28/2014 0718   ALT 32 02/01/2024 1734   ALT 48 06/28/2014 0718   ALKPHOS 78 02/01/2024 1734   ALKPHOS 86 06/28/2014 0718   BILITOT 0.8 02/01/2024 1734   BILITOT 0.2 06/28/2014 0718   GFRNONAA >60 02/02/2024 1117   GFRNONAA >60 06/29/2014 0353     No results found.     Assessment & Plan:   1. Bilateral carotid artery stenosis (Primary) Recommend:  The patient is s/p successful left carotid stent  Duplex ultrasound  shows 1 to 39% stenosis bilaterally.  Continue dual antiplatelet therapy as prescribed for a minimum of 6 months Continue management of CAD, HTN and Hyperlipidemia Healthy heart diet,  encouraged exercise at least 4 times per week  The patient's NIHSS score is as follows: 1 Mild: 1 - 5 Mild to Moderately Severe: 5 - 14 Severe: 15 - 24 Very Severe: >25  Follow up in 3 months with duplex ultrasound and physical exam based on the patient's carotid surgery   2. Mixed hyperlipidemia Continue statin as ordered and  reviewed, no changes at this time  3. Primary hypertension Continue antihypertensive medications as already ordered, these medications have been reviewed and there are no changes at this time.   Current Outpatient Medications on File Prior to Visit  Medication Sig Dispense Refill   apixaban  (ELIQUIS ) 5 MG TABS tablet Take 1 tablet (5 mg total) by mouth 2 (two) times daily. 180 tablet 1   aspirin  EC 81 MG tablet Take 81 mg by mouth daily. Swallow whole.     DULoxetine  (CYMBALTA ) 60 MG capsule Take 60 mg by mouth daily.     ezetimibe  (ZETIA ) 10 MG tablet Take 1 tablet (10 mg total) by mouth daily. 90 tablet 3   irbesartan  (AVAPRO ) 150 MG tablet Take 150 mg by mouth daily.     isosorbide  mononitrate (IMDUR ) 60 MG 24 hr tablet Take 1 tablet (60 mg total) by mouth 2 (two) times daily. 180 tablet 3   metoprolol  succinate (TOPROL -XL) 25 MG 24 hr tablet Take 3 tablets (75 mg total) by mouth daily. Take with or immediately following a meal. 90 tablet 3   Multiple Vitamin (MULTIVITAMIN WITH MINERALS) TABS tablet Take 1 tablet by mouth daily. 30 tablet 0   nitroGLYCERIN  (NITROSTAT ) 0.4 MG SL tablet Place 1 tablet (0.4 mg total) under the tongue every 5 (five) minutes as needed for chest pain. 25 tablet 0   pantoprazole  (PROTONIX ) 40 MG tablet Take 1 tablet (40 mg total) by mouth daily. 30 tablet 1  rosuvastatin  (CRESTOR ) 40 MG tablet Take 1 tablet (40 mg total) by mouth daily. 90 tablet 3   VENTOLIN  HFA 108 (90 Base) MCG/ACT inhaler Inhale 1-2 puffs into the lungs every 4 (four) hours as needed for shortness of breath or wheezing. SMARTSIG:2 Puff(s) By Mouth Every 4 Hours PRN 8 g 1   hydrOXYzine  (VISTARIL ) 25 MG capsule Take 25 mg by mouth 2 (two) times daily. (Patient not taking: Reported on 02/10/2024)     mometasone -formoterol  (DULERA ) 200-5 MCG/ACT AERO Inhale 2 puffs into the lungs 2 (two) times daily. (Patient not taking: Reported on 02/10/2024) 1 each 0   nicotine  (NICODERM CQ  - DOSED IN MG/24  HOURS) 21 mg/24hr patch Place 21 mg onto the skin daily as needed. (Patient not taking: Reported on 02/10/2024)     QUEtiapine  (SEROQUEL ) 200 MG tablet Take 1 tablet (200 mg total) by mouth at bedtime. (Patient not taking: Reported on 02/10/2024) 30 tablet 1   SPIRIVA  RESPIMAT 1.25 MCG/ACT AERS Inhale 2 sprays into the lungs daily. (Patient not taking: Reported on 02/10/2024) 4 g 1   No current facility-administered medications on file prior to visit.    There are no Patient Instructions on file for this visit. No follow-ups on file.   Steffi Noviello E Nakiya Rallis, NP

## 2024-02-21 ENCOUNTER — Emergency Department
Admission: EM | Admit: 2024-02-21 | Discharge: 2024-02-21 | Discharge status: Against Medical Advice | Attending: Emergency Medicine | Admitting: Emergency Medicine

## 2024-02-21 ENCOUNTER — Other Ambulatory Visit: Payer: Self-pay

## 2024-02-21 ENCOUNTER — Emergency Department

## 2024-02-21 ENCOUNTER — Encounter: Payer: Self-pay | Admitting: *Deleted

## 2024-02-21 DIAGNOSIS — Y908 Blood alcohol level of 240 mg/100 ml or more: Secondary | ICD-10-CM | POA: Diagnosis not present

## 2024-02-21 DIAGNOSIS — Z5321 Procedure and treatment not carried out due to patient leaving prior to being seen by health care provider: Secondary | ICD-10-CM | POA: Insufficient documentation

## 2024-02-21 DIAGNOSIS — R079 Chest pain, unspecified: Secondary | ICD-10-CM | POA: Diagnosis present

## 2024-02-21 DIAGNOSIS — R0602 Shortness of breath: Secondary | ICD-10-CM | POA: Diagnosis not present

## 2024-02-21 DIAGNOSIS — F1010 Alcohol abuse, uncomplicated: Secondary | ICD-10-CM | POA: Insufficient documentation

## 2024-02-21 LAB — CBC
HCT: 45.1 % (ref 39.0–52.0)
Hemoglobin: 15.3 g/dL (ref 13.0–17.0)
MCH: 31.8 pg (ref 26.0–34.0)
MCHC: 33.9 g/dL (ref 30.0–36.0)
MCV: 93.8 fL (ref 80.0–100.0)
Platelets: 324 K/uL (ref 150–400)
RBC: 4.81 MIL/uL (ref 4.22–5.81)
RDW: 13.2 % (ref 11.5–15.5)
WBC: 7.1 K/uL (ref 4.0–10.5)
nRBC: 0 % (ref 0.0–0.2)

## 2024-02-21 LAB — BASIC METABOLIC PANEL WITH GFR
Anion gap: 11 (ref 5–15)
BUN: 16 mg/dL (ref 6–20)
CO2: 26 mmol/L (ref 22–32)
Calcium: 9.5 mg/dL (ref 8.9–10.3)
Chloride: 104 mmol/L (ref 98–111)
Creatinine, Ser: 1.02 mg/dL (ref 0.61–1.24)
GFR, Estimated: >60 mL/min (ref >60)
Glucose, Bld: 104 mg/dL — ABNORMAL HIGH (ref 70–99)
Potassium: 3.7 mmol/L (ref 3.5–5.1)
Sodium: 141 mmol/L (ref 135–145)

## 2024-02-21 LAB — ETHANOL: Alcohol, Ethyl (B): 306 mg/dL (ref <15)

## 2024-02-21 LAB — TROPONIN I (HIGH SENSITIVITY): Troponin I (High Sensitivity): 20 ng/L — ABNORMAL HIGH (ref <18)

## 2024-02-21 NOTE — ED Notes (Signed)
 No answer when called several times from lobby

## 2024-02-21 NOTE — ED Triage Notes (Signed)
 Pt to triage via wheelchair.  Pt reports chest pain for 1 hour.   Pt also has sob.  No n/v  Pt tearful.

## 2024-02-22 ENCOUNTER — Emergency Department

## 2024-02-22 ENCOUNTER — Emergency Department: Admission: EM | Admit: 2024-02-22 | Discharge: 2024-02-22 | Discharge status: Against Medical Advice

## 2024-02-22 ENCOUNTER — Emergency Department
Admission: EM | Admit: 2024-02-22 | Discharge: 2024-02-22 | Disposition: A | Discharge status: Home / Self Care | Attending: Emergency Medicine | Admitting: Emergency Medicine

## 2024-02-22 DIAGNOSIS — M546 Pain in thoracic spine: Secondary | ICD-10-CM | POA: Diagnosis not present

## 2024-02-22 DIAGNOSIS — R079 Chest pain, unspecified: Secondary | ICD-10-CM | POA: Insufficient documentation

## 2024-02-22 DIAGNOSIS — I110 Hypertensive heart disease with heart failure: Secondary | ICD-10-CM | POA: Diagnosis not present

## 2024-02-22 DIAGNOSIS — I509 Heart failure, unspecified: Secondary | ICD-10-CM | POA: Insufficient documentation

## 2024-02-22 DIAGNOSIS — R9082 White matter disease, unspecified: Secondary | ICD-10-CM | POA: Diagnosis not present

## 2024-02-22 DIAGNOSIS — I2510 Atherosclerotic heart disease of native coronary artery without angina pectoris: Secondary | ICD-10-CM | POA: Diagnosis not present

## 2024-02-22 LAB — CBC
HCT: 44.1 % (ref 39.0–52.0)
Hemoglobin: 15.1 g/dL (ref 13.0–17.0)
MCH: 32.1 pg (ref 26.0–34.0)
MCHC: 34.2 g/dL (ref 30.0–36.0)
MCV: 93.8 fL (ref 80.0–100.0)
Platelets: 324 K/uL (ref 150–400)
RBC: 4.7 MIL/uL (ref 4.22–5.81)
RDW: 13 % (ref 11.5–15.5)
WBC: 7.7 K/uL (ref 4.0–10.5)
nRBC: 0 % (ref 0.0–0.2)

## 2024-02-22 LAB — TROPONIN I (HIGH SENSITIVITY)
Troponin I (High Sensitivity): 15 ng/L (ref <18)
Troponin I (High Sensitivity): 19 ng/L — ABNORMAL HIGH (ref <18)

## 2024-02-22 LAB — LIPASE, BLOOD: Lipase: 41 U/L (ref 11–51)

## 2024-02-22 LAB — HEPATIC FUNCTION PANEL
ALT: 33 U/L (ref 0–44)
AST: 40 U/L (ref 15–41)
Albumin: 3.9 g/dL (ref 3.5–5.0)
Alkaline Phosphatase: 60 U/L (ref 38–126)
Bilirubin, Direct: <0.1 mg/dL (ref 0.0–0.2)
Total Bilirubin: 0.7 mg/dL (ref 0.0–1.2)
Total Protein: 7.5 g/dL (ref 6.5–8.1)

## 2024-02-22 LAB — BASIC METABOLIC PANEL WITH GFR
Anion gap: 15 (ref 5–15)
BUN: 14 mg/dL (ref 6–20)
CO2: 21 mmol/L — ABNORMAL LOW (ref 22–32)
Calcium: 8.9 mg/dL (ref 8.9–10.3)
Chloride: 100 mmol/L (ref 98–111)
Creatinine, Ser: 0.8 mg/dL (ref 0.61–1.24)
GFR, Estimated: >60 mL/min (ref >60)
Glucose, Bld: 96 mg/dL (ref 70–99)
Potassium: 3.8 mmol/L (ref 3.5–5.1)
Sodium: 136 mmol/L (ref 135–145)

## 2024-02-22 MED ORDER — NITROGLYCERIN 0.4 MG SL SUBL: 0.4000 mg | SUBLINGUAL_TABLET | SUBLINGUAL | Status: DC | PRN

## 2024-02-22 MED ORDER — HYDROMORPHONE HCL 1 MG/ML IJ SOLN
1.0000 mg | Freq: Once | INTRAMUSCULAR | Status: AC
  Administered 2024-02-22: 1 mg via INTRAVENOUS
  Filled 2024-02-22: qty 1

## 2024-02-22 MED ORDER — IPRATROPIUM-ALBUTEROL 0.5-2.5 (3) MG/3ML IN SOLN
3.0000 mL | Freq: Once | RESPIRATORY_TRACT | Status: AC
  Administered 2024-02-22: 3 mL via RESPIRATORY_TRACT
  Filled 2024-02-22: qty 3

## 2024-02-22 MED ORDER — FENTANYL CITRATE PF 50 MCG/ML IJ SOSY
50.0000 ug | PREFILLED_SYRINGE | Freq: Once | INTRAMUSCULAR | Status: AC
  Administered 2024-02-22: 50 ug via INTRAVENOUS
  Filled 2024-02-22: qty 1

## 2024-02-22 MED ORDER — IOHEXOL 350 MG/ML SOLN
100.0000 mL | Freq: Once | INTRAVENOUS | Status: AC | PRN
  Administered 2024-02-22: 100 mL via INTRAVENOUS

## 2024-02-22 MED ORDER — LORAZEPAM 2 MG/ML IJ SOLN
1.0000 mg | Freq: Once | INTRAMUSCULAR | Status: AC
  Administered 2024-02-22: 1 mg via INTRAVENOUS
  Filled 2024-02-22: qty 1

## 2024-02-22 NOTE — ED Triage Notes (Signed)
 Pt arrives via EMS from home with c/o CP, +ETOH; pt ambulatory without difficulty or distress noted and immediately walks out of ED lobby upon arrival, stating that he is leaving

## 2024-02-22 NOTE — ED Notes (Signed)
 Pt stating feeling a little better after neb treatment

## 2024-02-22 NOTE — ED Provider Notes (Signed)
 [facility] Provider Note    Event Date/Time   First MD Initiated Contact with Patient 02/22/24 [phone]     (approximate)   History   Chest Pain and Back Pain   HPI  Marcus [name] is a 58 year old male with history of CAD, HTN, CHF, A-fib, polysubstance use presenting to the emergency department for evaluation of chest pain.  Patient reports that yesterday he had onset of chest pain radiating into his back.  Presented to the ER yesterday, but left prior to seeing a provider.  Reports ongoing pain today.  Took nitroglycerin  at home without improvement.  Does report alcohol use this morning.     Physical Exam   Triage Vital Signs: ED Triage Vitals  Encounter Vitals Group     BP 02/22/24 0945 (!) 159/102     Girls Systolic BP Percentile --      Girls Diastolic BP Percentile --      Boys Systolic BP Percentile --      Boys Diastolic BP Percentile --      Pulse Rate 02/22/24 0945 (!) 111     Resp 02/22/24 0945 (!) 22     Temp 02/22/24 0945 98.3 F (36.8 C)     Temp Source 02/22/24 0945 Oral     SpO2 02/22/24 0945 97 %     Weight 02/22/24 0946 150 lb (68 kg)     Height 02/22/24 0946 5' 3 (1.6 m)     Head Circumference --      Peak Flow --      Pain Score 02/22/24 0945 10     Pain Loc --      Pain Education --      Exclude from Growth Chart --     Most recent vital signs: Vitals:   02/22/24 1200 02/22/24 1240  BP:  (!) 163/115  Pulse: 94 (!) 119  Resp: 14   Temp:    SpO2: 97% 96%     General: Awake, interactive  CV:  Mild tachycardia, good peripheral perfusion.  Resp:  Unlabored respirations, lungs clear to auscultation Abd:  Nondistended, soft, nontender Neuro:  Symmetric facial movement, fluid speech   ED Results / Procedures / Treatments   Labs (all labs ordered are listed, but only abnormal results are displayed) Labs Reviewed  BASIC METABOLIC PANEL WITH GFR - Abnormal; Notable for the following components:      Result Value    CO2 21 (*)    All other components within normal limits  TROPONIN I (HIGH SENSITIVITY) - Abnormal; Notable for the following components:   Troponin I (High Sensitivity) 19 (*)    All other components within normal limits  CBC  HEPATIC FUNCTION PANEL  LIPASE, BLOOD  TROPONIN I (HIGH SENSITIVITY)     EKG EKG independently reviewed and interpreted by myself demonstrates:  EKG demonstrates sinus tachycardia rate 106, PR 146, QRS 116, QTc 478, no acute ST changes  RADIOLOGY Imaging independently reviewed and interpreted by myself demonstrates:  CT head without acute bleed CTA dissection protocol without acute aortic syndrome or acute findings.  Previously noted intimal flap redemonstrated, previously discussed with vascular surgery at prior ER visit and who noted this could be followed as an outpatient  Formal Radiology Read:  CT Head Wo Contrast Result Date: 02/22/2024 EXAM: CT HEAD WITHOUT 02/22/2024 10:32:50 AM TECHNIQUE: CT of the head was performed without the administration of intravenous contrast. Automated exposure control, iterative reconstruction, and/or weight based adjustment of the  mA/kV was utilized to reduce the radiation dose to as low as reasonably achievable. COMPARISON: CT of the head dated 12/06/2023. CLINICAL HISTORY: Mental status change, unknown cause. Pt c/o central chest pain and mid back pain starting yesterday. Pain score 10/10. Pt reports taking nitroglycerin  x2 w/o relief. Pt reports he was seen yesterday for same, but left. Pt reports drinking 2 beers and 2 shots of liquor this morning. FINDINGS: BRAIN AND VENTRICLES: No acute intracranial hemorrhage. No mass effect or midline shift. No extra-axial fluid collection. Gray-white differentiation is maintained. No hydrocephalus. Age-related atrophy present. Mild cerebral white matter disease. ORBITS: No acute abnormality. SINUSES AND MASTOIDS: Mild mucosal disease present within the frontal, ethmoid, sphenoid and  maxillary sinuses. SOFT TISSUES AND SKULL: No acute skull fracture. No acute soft tissue abnormality. IMPRESSION: 1. No acute intracranial abnormality. 2. Age-related atrophy and mild cerebral white matter disease. 3. Mild mucosal disease in the frontal, ethmoid, sphenoid, and maxillary sinuses. Electronically signed by: evalene coho 02/22/2024 11:29 AM EDT RP Workstation: [id number]   CT Angio Chest/Abd/Pel for Dissection W and/or Wo Contrast Result Date: 02/22/2024 CLINICAL DATA:  Acute aortic syndrome (AAS) suspected. Central chest and mid back pain. EXAM: CT ANGIOGRAPHY CHEST, ABDOMEN AND PELVIS TECHNIQUE: Non-contrast CT of the chest was initially obtained. Multidetector CT imaging through the chest, abdomen and pelvis was performed using the standard protocol during bolus administration of intravenous contrast. Multiplanar reconstructed images and MIPs were obtained and reviewed to evaluate the vascular anatomy. RADIATION DOSE REDUCTION: This exam was performed according to the departmental dose-optimization program which includes automated exposure control, adjustment of the mA and/or kV according to patient size and/or use of iterative reconstruction technique. CONTRAST:  OMNIPAQUE  IOHEXOL  350 MG/ML SOLN COMPARISON:  CTA chest, abdomen, and pelvis dated 12/06/2023. FINDINGS: CTA CHEST FINDINGS Cardiovascular: Preferential opacification of the thoracic aorta. No evidence of thoracic intramural hematoma, aortic aneurysm or dissection. Atherosclerotic calcification of the thoracic aorta and the arch branch vessels. No evidence of pulmonary embolism to the proximal subsegmental level. Normal heart size. No pericardial effusion. Mediastinum/Nodes: No enlarged mediastinal, hilar, or axillary lymph nodes. Thyroid gland, trachea, and esophagus demonstrate no significant findings. Lungs/Pleura: No focal consolidation, pleural effusion, or pneumothorax. No suspicious pulmonary nodule. Minimal subsegmental  atelectasis in the lingula. Musculoskeletal: No chest wall abnormality. No acute or significant osseous findings. Review of the MIP images confirms the above findings. CTA ABDOMEN AND PELVIS FINDINGS VASCULAR Aorta: Similar small focal intimal flap along the anterior left infrarenal abdominal aorta (series 5, image 168 and series 8, image 70), extending from the 12 to 4 o'clock position. No aneurysm. Redemonstrated moderate noncalcified and mild calcified atheromatous plaque of the abdominal aorta. Celiac: Patent without evidence of aneurysm, dissection, or significant stenosis. SMA: Patent without evidence of aneurysm, dissection, or significant stenosis. Renals: Patent without evidence of aneurysm, dissection, vasculitis, fibromuscular dysplasia or significant stenosis. IMA: Patent without evidence of aneurysm or dissection. Similar at least moderate narrowing at the origin of the IMA. Inflow: Patent without evidence of aneurysm or dissection. Similar moderate narrowing at the origin of the right common iliac artery predominantly secondary to noncalcified atheromatous plaque. Similar moderate-to-severe narrowing at the origin of the left internal iliac artery. Veins: No obvious venous abnormality within the limitations of this arterial phase study. Review of the MIP images confirms the above findings. NON-VASCULAR Hepatobiliary: No suspicious focal hepatic lesion. Gallbladder is unremarkable. No biliary dilatation. Pancreas: Unremarkable. No pancreatic ductal dilatation or surrounding inflammatory changes. Spleen: Normal in size without  focal abnormality. Adrenals/Urinary Tract: Adrenal glands are unremarkable. Kidneys are normal, without renal calculi, focal lesion, or hydronephrosis. Bladder is unremarkable. Stomach/Bowel: Stomach is within normal limits. Appendix appears normal. No evidence of bowel wall thickening, distention, or inflammatory changes. Sigmoid colonic diverticulosis without evidence of acute  diverticulitis. Lymphatic: No enlarged abdominal or pelvic lymph nodes. Reproductive: Prostate is unremarkable. Other: Stable small fat containing right paraumbilical hernia and bilateral inguinal hernias. No abdominopelvic ascites. No intraperitoneal free air. Musculoskeletal: No acute or significant osseous findings. Degenerative disc changes at L4-L5 and L5-S1. Review of the MIP images confirms the above findings. IMPRESSION: 1. No evidence of acute aortic syndrome. 2. Similar small focal intimal flap along the left infrarenal abdominal aorta. No aneurysm. 3. Moderate noncalcified and mild calcified atheromatous plaque of the abdominal aorta with similar at least moderate narrowing of the IMA origin, right common iliac artery and left internal iliac artery origin. 4. Additional unchanged nonacute findings, as above. Aortic Atherosclerosis (ICD10-I70.0). Electronically Signed   By: Harrietta Sherry M.D.   On: 02/22/2024 11:16   DG Chest Port 1 View Result Date: 02/22/2024 CLINICAL DATA:  Chest pain. EXAM: PORTABLE CHEST 1 VIEW COMPARISON:  12/06/2023. FINDINGS: The heart size and mediastinal contours are within normal limits. No focal consolidation, sizeable pleural effusion, or pneumothorax. No acute osseous abnormality. IMPRESSION: No acute cardiopulmonary findings. Electronically Signed   By: Harrietta Sherry M.D.   On: 02/22/2024 10:53    PROCEDURES:  Critical Care performed: No  Procedures   MEDICATIONS ORDERED IN ED: Medications  nitroGLYCERIN  (NITROSTAT ) SL tablet 0.4 mg (has no administration in time range)  fentaNYL  (SUBLIMAZE ) injection 50 mcg (50 mcg Intravenous Given 02/22/24 1016)  iohexol  (OMNIPAQUE ) 350 MG/ML injection 100 mL (100 mLs Intravenous Contrast Given 02/22/24 1024)  HYDROmorphone  (DILAUDID ) injection 1 mg (1 mg Intravenous Given 02/22/24 1110)  ipratropium-albuterol  (DUONEB) 0.5-2.5 (3) MG/3ML nebulizer solution 3 mL (3 mLs Nebulization Given 02/22/24 1143)  LORazepam  (ATIVAN )  injection 1 mg (1 mg Intravenous Given 02/22/24 1212)     IMPRESSION / MDM / ASSESSMENT AND PLAN / ED COURSE  I reviewed the triage vital signs and the nursing notes.  Differential diagnosis includes, but is not limited to, ACS, aortic dissection, pneumonia, pneumothorax, cocaine associated chest pain  Patient's presentation is most consistent with acute presentation with potential threat to life or bodily function.  58 year old male presenting to the emergency department for evaluation of chest pain.  Intoxicated, but appears uncomfortable on presentation.  Will obtain labs, dissection protocol to further evaluate.  CT dissection protocol with stable findings, no acute aortic syndrome.  CT head also ordered given mild altered mental status, but suspect likely related to alcohol intoxication.  This was reassuring.  Labs overall reassuring.  Negative initial troponin, minimal increase to 19 on reevaluation, overall stable compared to prior.  Low suspicion ACS based on clinical history.  Patient was reassessed after pain control and completion of his workup here.  He did report improvement in his symptoms.  Did note questionable possible SI as below with RN, but assessed as below and reassessed prior to discharge and continues to deny any SI or other acute psychiatric complaints.  I did consider admission given his history of ischemic cardiomyopathy, but patient does report resolved chest pain, prefers to follow-up as an outpatient.  Do not think this is unreasonable.  Strict return precautions provided.  Patient discharged in stable condition.   Clinical Course as of 02/22/24 1300  Tue Feb 22, 2024  1129 Notified by RN that patient made a comment reporting that if his pain was not controlled he wished he would die.  I did go evaluate the patient.  Discussed that he had just received pain medication, we can try different pain medication to see if that is more effective.  He does report that he has a  history of suicidal thoughts in the past, but denies these currently.  He does not feel that he needs a behavioral health evaluation currently.  Do not feel he currently meets IVC criteria.  Will continue his medical workup, reassess following this to see if patient is appropriate for psychiatric consultation. [NR]    Clinical Course User Index [NR] Levander Slate, MD     FINAL CLINICAL IMPRESSION(S) / ED DIAGNOSES   Final diagnoses:  Nonspecific chest pain     Rx / DC Orders   ED Discharge Orders     None        Note:  This document was prepared using Dragon voice recognition software and may include unintentional dictation errors.   Levander Slate, MD 02/22/24 1300

## 2024-02-22 NOTE — ED Notes (Addendum)
 Pt up wandering down the hall stating I asked for pain medicine like 30 min ago. Pt informed was given Fentanyl  45 min ago. Pt then stating well I'm still hurting, I would just rather die. This RN asked pt if he would like to speak with our psych team. Pt initially stated yes, I do need to get a new psychiatrist, then changing mind. Pt stating he is a current everyday drinker, last drink this morning at 0400. Dr Levander informed.

## 2024-02-22 NOTE — Discharge Instructions (Signed)
 You were seen in the Emergency Department today for evaluation of your chest pain. Fortunately, your labs, EKG, and CT were overall reassuring against a emergency cause for your pain.  Follow-up with your cardiologist for further evaluation of your chest pain.  Return to the ER for any new or worsening symptoms including worsening chest pain, difficulty breathing, or any other new or concerning symptoms that you believe warrants immediate attention.

## 2024-02-22 NOTE — ED Notes (Signed)
 Pt stating it feels hard to breathe. Pt sitting gasping for air. Placed on 2L [place]. Dr Levander informed.

## 2024-02-22 NOTE — ED Triage Notes (Addendum)
 Pt c/o central chest pain and mid back pain starting yesterday.  Pain score 10/10.  Pt reports taking nitroglycerin  x2 w/o relief.  Pt reports he was seen yesterday for same, but left.  Pt reports drinking 2 beers and 2 shots of liquor this morning.

## 2024-04-17 ENCOUNTER — Other Ambulatory Visit (INDEPENDENT_AMBULATORY_CARE_PROVIDER_SITE_OTHER): Payer: Self-pay | Admitting: Vascular Surgery

## 2024-05-09 ENCOUNTER — Other Ambulatory Visit (INDEPENDENT_AMBULATORY_CARE_PROVIDER_SITE_OTHER): Payer: Self-pay | Admitting: Vascular Surgery

## 2024-05-09 DIAGNOSIS — I6523 Occlusion and stenosis of bilateral carotid arteries: Secondary | ICD-10-CM

## 2024-05-12 ENCOUNTER — Ambulatory Visit (INDEPENDENT_AMBULATORY_CARE_PROVIDER_SITE_OTHER): Admitting: Nurse Practitioner

## 2024-05-12 ENCOUNTER — Ambulatory Visit (INDEPENDENT_AMBULATORY_CARE_PROVIDER_SITE_OTHER)

## 2024-05-12 ENCOUNTER — Encounter (INDEPENDENT_AMBULATORY_CARE_PROVIDER_SITE_OTHER): Payer: Self-pay | Admitting: Nurse Practitioner

## 2024-05-12 VITALS — BP 146/87 | HR 95 | Resp 18 | Ht 63.0 in | Wt 165.4 lb

## 2024-05-12 DIAGNOSIS — F32A Depression, unspecified: Secondary | ICD-10-CM | POA: Diagnosis not present

## 2024-05-12 DIAGNOSIS — I10 Essential (primary) hypertension: Secondary | ICD-10-CM

## 2024-05-12 DIAGNOSIS — I6523 Occlusion and stenosis of bilateral carotid arteries: Secondary | ICD-10-CM | POA: Diagnosis not present

## 2024-05-12 MED ORDER — APIXABAN 5 MG PO TABS: 5.0000 mg | ORAL_TABLET | Freq: Two times a day (BID) | ORAL | 1 refills | Status: AC

## 2024-05-12 MED ORDER — ROSUVASTATIN CALCIUM 40 MG PO TABS: 40.0000 mg | ORAL_TABLET | Freq: Every day | ORAL | 3 refills | Status: AC

## 2024-05-12 MED ORDER — CLOPIDOGREL BISULFATE 75 MG PO TABS
75.0000 mg | ORAL_TABLET | Freq: Every day | ORAL | 6 refills | Status: AC
Start: 2024-05-12 — End: ?

## 2024-05-14 ENCOUNTER — Encounter (INDEPENDENT_AMBULATORY_CARE_PROVIDER_SITE_OTHER): Payer: Self-pay | Admitting: Nurse Practitioner

## 2024-05-14 NOTE — Progress Notes (Signed)
 Subjective:    Patient ID: Marcus [name], male    DOB: 08/05/1965, 58 y.o.   MRN: [id number] Chief Complaint  Patient presents with   Follow-up    3 month follow up + carotid     The patient is seen for follow up evaluation of carotid stenosis status post left carotid ICA stent on 01/03/2024.  He previously had a right carotid stent placed on 08/31/2022 there were no post operative problems or complications related to the surgery.  The patient denies neck or incisional pain.  At this time his largest complaint is about losing his psychiatrist and his primary care provider.  He notes that this happened when he went on Medicare Medicaid.  This has been over a year and he has not attempted to find a new primary physician until he has noticed that he is out of his medications.   The patient denies interval amaurosis fugax. There is no recent history of TIA symptoms or focal motor deficits. There is no prior documented CVA.   The patient denies headache.   The patient is taking enteric-coated aspirin  81 mg daily.   No recent shortening of the patient's walking distance or new symptoms consistent with claudication.  No history of rest pain symptoms. No new ulcers or wounds of the lower extremities have occurred.   There is no history of DVT, PE or superficial thrombophlebitis. No recent episodes of angina or shortness of breath documented.    Today he has 1 to 39% stenosis bilaterally.  No significant stenosis noted in the carotid stents.  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      Objective:   Physical Exam Vitals reviewed.  HENT:     Head: Normocephalic.  Cardiovascular:     Rate and Rhythm: Normal rate.     Pulses: Normal pulses.  Pulmonary:     Effort: Pulmonary effort is normal.  Skin:    General: Skin is warm and dry.  Neurological:     Mental Status: He is  alert and oriented to person, place, and time.  Psychiatric:        Mood and Affect: Mood normal.        Behavior: Behavior normal.        Thought Content: Thought content normal.        Judgment: Judgment normal.     BP (!) 146/87 (BP Location: Left Arm, Patient Position: Sitting, Cuff Size: Normal)   Pulse 95   Resp 18   Ht 5' 3 (1.6 m)   Wt 165 lb 6.4 oz (75 kg)   BMI 29.30 kg/m   Past Medical History:  Diagnosis Date   CAD in native artery    a. 06/28/2014: STEMI, cath LM nl, mLAD 50%, mLCx 20%, OM1 small in size 80%, ramus 100% s/p PCI/DES 0%, EF 40%    Carotid artery disease    CHF (congestive heart failure) (HCC)    Cocaine abuse (HCC)    Depression    H/O gastric ulcer    Hyperglycemia    a. A1C 5.6%   Hypertension    Ischemic cardiomyopathy    a. EF 40% by cath 06/28/2014   MI (myocardial infarction) (HCC)    Polysubstance abuse (HCC)    a. cocaine, tobacco, and alcohol    Tobacco abuse     Social History  Socioeconomic History   Marital status: Single    Spouse name: Not on file   Number of children: Not on file   Years of education: Not on file   Highest education level: Not on file  Occupational History   Not on file  Tobacco Use   Smoking status: Every Day    Current packs/day: 1.00    Average packs/day: 1 pack/day for 48.8 years (48.8 ttl pk-yrs)    Types: Cigarettes    Start date: 07/21/1975   Smokeless tobacco: Never   Tobacco comments:    Pt. Is actively trying to quit and is wearing the patch.   Vaping Use   Vaping status: Never Used  Substance and Sexual Activity   Alcohol use: Yes    Alcohol/week: 12.0 standard drinks of alcohol    Types: 12 Cans of beer per week   Drug use: No    Types: Cocaine, Marijuana    Comment: none in at least 2 years   Sexual activity: Not on file  Other Topics Concern   Not on file  Social History Narrative   Not on file   Social Drivers of Health   Financial Resource Strain: Not on file  Food  Insecurity: No Food Insecurity (01/03/2024)   Hunger Vital Sign    Worried About Running Out of Food in the Last Year: Never true    Ran Out of Food in the Last Year: Never true  Transportation Needs: No Transportation Needs (01/03/2024)   PRAPARE - Administrator, Civil Service (Medical): No    Lack of Transportation (Non-Medical): No  Physical Activity: Not on file  Stress: Not on file  Social Connections: Not on file  Intimate Partner Violence: Not At Risk (01/03/2024)   Humiliation, Afraid, Rape, and Kick questionnaire    Fear of Current or Ex-Partner: No    Emotionally Abused: No    Physically Abused: No    Sexually Abused: No    Past Surgical History:  Procedure Laterality Date   CARDIAC CATHETERIZATION  06/28/2014   x1 stent   CAROTID PTA/STENT INTERVENTION Right 08/31/2022   Procedure: CAROTID PTA/STENT INTERVENTION;  Surgeon: Marea Selinda RAMAN, MD;  Location: [facility];  Service: Cardiovascular;  Laterality: Right;   CAROTID PTA/STENT INTERVENTION Left 01/03/2024   Procedure: CAROTID PTA/STENT INTERVENTION;  Surgeon: Marea Selinda RAMAN, MD;  Location: [facility];  Service: Cardiovascular;  Laterality: Left;   COLONOSCOPY N/A 08/12/2022   Procedure: COLONOSCOPY;  Surgeon: Toledo, Ladell POUR, MD;  Location: [facility];  Service: Gastroenterology;  Laterality: N/A;   CORONARY STENT INTERVENTION N/A 08/26/2022   Procedure: CORONARY STENT INTERVENTION;  Surgeon: Mady Bruckner, MD;  Location: [facility];  Service: Cardiovascular;  Laterality: N/A;   ESOPHAGOGASTRODUODENOSCOPY N/A 08/12/2022   Procedure: ESOPHAGOGASTRODUODENOSCOPY (EGD);  Surgeon: Toledo, Ladell POUR, MD;  Location: [facility];  Service: Gastroenterology;  Laterality: N/A;   LEFT HEART CATH AND CORONARY ANGIOGRAPHY Left 08/26/2022   Procedure: LEFT HEART CATH AND CORONARY ANGIOGRAPHY;  Surgeon: Mady Bruckner, MD;  Location: [facility];  Service: Cardiovascular;  Laterality:  Left;   NISSEN FUNDOPLICATION     stomach ulcer surgery      Family History  Problem Relation Age of Onset   Heart failure Mother    Stroke Father    Heart failure Father    Prostate cancer Brother    Heart attack Brother     Allergies  Allergen Reactions   Oxycodone-Acetaminophen   Other (See Comments)   Codeine Rash       Latest Ref Rng & Units 02/22/2024    9:50 AM 02/21/2024    7:29 PM 02/01/2024    5:34 PM  CBC  WBC 4.0 - 10.5 K/uL 7.7  7.1  13.1   Hemoglobin 13.0 - 17.0 g/dL 84.8  84.6  85.1   Hematocrit 39.0 - 52.0 % 44.1  45.1  42.7   Platelets 150 - 400 K/uL 324  324  530       CMP     Component Value Date/Time   NA 136 02/22/2024 0950   NA 139 06/29/2014 0353   K 3.8 02/22/2024 0950   K 3.6 06/29/2014 0353   CL 100 02/22/2024 0950   CL 107 06/29/2014 0353   CO2 21 (L) 02/22/2024 0950   CO2 25 06/29/2014 0353   GLUCOSE 96 02/22/2024 0950   GLUCOSE 109 (H) 06/29/2014 0353   BUN 14 02/22/2024 0950   BUN 8 06/29/2014 0353   CREATININE 0.80 02/22/2024 0950   CREATININE 0.92 06/29/2014 0353   CALCIUM  8.9 02/22/2024 0950   CALCIUM  8.6 06/29/2014 0353   PROT 7.5 02/22/2024 0950   PROT 7.6 06/28/2014 0718   ALBUMIN 3.9 02/22/2024 0950   ALBUMIN 3.7 06/28/2014 0718   AST 40 02/22/2024 0950   AST 257 (H) 06/28/2014 0718   ALT 33 02/22/2024 0950   ALT 48 06/28/2014 0718   ALKPHOS 60 02/22/2024 0950   ALKPHOS 86 06/28/2014 0718   BILITOT 0.7 02/22/2024 0950   BILITOT 0.2 06/28/2014 0718   GFRNONAA >60 02/22/2024 0950   GFRNONAA >60 06/29/2014 0353     No results found.     Assessment & Plan:   1. Bilateral carotid artery stenosis (Primary) Recommend:  Given the patient's asymptomatic subcritical stenosis no further invasive testing or surgery at this time.  Duplex ultrasound shows 1-39% stenosis bilaterally.  Continue antiplatelet therapy as prescribed Continue management of CAD, HTN and Hyperlipidemia Healthy heart diet,  encouraged exercise  at least 4 times per week  Follow up in 6 months with duplex ultrasound and physical exam   2. Primary hypertension Blood pressures acceptable today.  Will place referral to family practice in order for him to reestablish care. - Ambulatory referral to [facility]  3. Depression, unspecified depression type Also placed a referral to psychiatry for him to reestablish care. - Ambulatory referral to Psychiatry   Current Outpatient Medications on File Prior to Visit  Medication Sig Dispense Refill   aspirin  EC 81 MG tablet Take 81 mg by mouth daily. Swallow whole.     DULoxetine  (CYMBALTA ) 60 MG capsule Take 60 mg by mouth daily.     ezetimibe  (ZETIA ) 10 MG tablet Take 1 tablet (10 mg total) by mouth daily. 90 tablet 3   hydrOXYzine  (VISTARIL ) 25 MG capsule Take 25 mg by mouth 2 (two) times daily.     irbesartan  (AVAPRO ) 150 MG tablet Take 150 mg by mouth daily.     isosorbide  mononitrate (IMDUR ) 60 MG 24 hr tablet Take 1 tablet (60 mg total) by mouth 2 (two) times daily. 180 tablet 3   metoprolol  succinate (TOPROL -XL) 25 MG 24 hr tablet Take 3 tablets (75 mg total) by mouth daily. Take with or immediately following a meal. 90 tablet 3   mometasone -formoterol  (DULERA ) 200-5 MCG/ACT AERO Inhale 2 puffs into the lungs 2 (two) times daily. 1 each 0   Multiple Vitamin (MULTIVITAMIN WITH MINERALS) TABS tablet Take  1 tablet by mouth daily. 30 tablet 0   nitroGLYCERIN  (NITROSTAT ) 0.4 MG SL tablet Place 1 tablet (0.4 mg total) under the tongue every 5 (five) minutes as needed for chest pain. 25 tablet 0   pantoprazole  (PROTONIX ) 40 MG tablet Take 1 tablet (40 mg total) by mouth daily. 30 tablet 1   VENTOLIN  HFA 108 (90 Base) MCG/ACT inhaler Inhale 1-2 puffs into the lungs every 4 (four) hours as needed for shortness of breath or wheezing. SMARTSIG:2 Puff(s) By Mouth Every 4 Hours PRN 8 g 1   nicotine  (NICODERM CQ  - DOSED IN MG/24 HOURS) 21 mg/24hr patch Place 21 mg onto the skin daily as needed.  (Patient not taking: Reported on 05/12/2024)     QUEtiapine  (SEROQUEL ) 200 MG tablet Take 1 tablet (200 mg total) by mouth at bedtime. (Patient not taking: Reported on 05/12/2024) 30 tablet 1   SPIRIVA  RESPIMAT 1.25 MCG/ACT AERS Inhale 2 sprays into the lungs daily. (Patient not taking: Reported on 05/12/2024) 4 g 1   No current facility-administered medications on file prior to visit.    There are no Patient Instructions on file for this visit. No follow-ups on file.   Tabithia Stroder E Lyndal Alamillo, NP

## 2024-05-18 ENCOUNTER — Other Ambulatory Visit: Payer: Self-pay

## 2024-05-18 ENCOUNTER — Encounter: Payer: Self-pay | Admitting: Emergency Medicine

## 2024-05-18 ENCOUNTER — Inpatient Hospital Stay
Admission: EM | Admit: 2024-05-18 | Discharge: 2024-05-23 | DRG: 329 | Disposition: A | Discharge status: Home / Self Care | Attending: Internal Medicine | Admitting: Internal Medicine

## 2024-05-18 ENCOUNTER — Emergency Department

## 2024-05-18 DIAGNOSIS — Z7982 Long term (current) use of aspirin: Secondary | ICD-10-CM | POA: Diagnosis not present

## 2024-05-18 DIAGNOSIS — E663 Overweight: Secondary | ICD-10-CM | POA: Diagnosis present

## 2024-05-18 DIAGNOSIS — Z955 Presence of coronary angioplasty implant and graft: Secondary | ICD-10-CM | POA: Diagnosis not present

## 2024-05-18 DIAGNOSIS — I6523 Occlusion and stenosis of bilateral carotid arteries: Secondary | ICD-10-CM | POA: Diagnosis present

## 2024-05-18 DIAGNOSIS — K5792 Diverticulitis of intestine, part unspecified, without perforation or abscess without bleeding: Principal | ICD-10-CM | POA: Diagnosis present

## 2024-05-18 DIAGNOSIS — I2510 Atherosclerotic heart disease of native coronary artery without angina pectoris: Secondary | ICD-10-CM | POA: Diagnosis present

## 2024-05-18 DIAGNOSIS — J449 Chronic obstructive pulmonary disease, unspecified: Secondary | ICD-10-CM | POA: Diagnosis present

## 2024-05-18 DIAGNOSIS — R112 Nausea with vomiting, unspecified: Secondary | ICD-10-CM | POA: Diagnosis not present

## 2024-05-18 DIAGNOSIS — Z7902 Long term (current) use of antithrombotics/antiplatelets: Secondary | ICD-10-CM | POA: Diagnosis not present

## 2024-05-18 DIAGNOSIS — I480 Paroxysmal atrial fibrillation: Secondary | ICD-10-CM | POA: Diagnosis present

## 2024-05-18 DIAGNOSIS — Z885 Allergy status to narcotic agent status: Secondary | ICD-10-CM

## 2024-05-18 DIAGNOSIS — R339 Retention of urine, unspecified: Secondary | ICD-10-CM | POA: Diagnosis present

## 2024-05-18 DIAGNOSIS — I5032 Chronic diastolic (congestive) heart failure: Secondary | ICD-10-CM | POA: Diagnosis present

## 2024-05-18 DIAGNOSIS — I4729 Other ventricular tachycardia: Secondary | ICD-10-CM | POA: Insufficient documentation

## 2024-05-18 DIAGNOSIS — I255 Ischemic cardiomyopathy: Secondary | ICD-10-CM | POA: Diagnosis present

## 2024-05-18 DIAGNOSIS — N35919 Unspecified urethral stricture, male, unspecified site: Secondary | ICD-10-CM | POA: Diagnosis present

## 2024-05-18 DIAGNOSIS — I252 Old myocardial infarction: Secondary | ICD-10-CM

## 2024-05-18 DIAGNOSIS — K5720 Diverticulitis of large intestine with perforation and abscess without bleeding: Secondary | ICD-10-CM | POA: Diagnosis present

## 2024-05-18 DIAGNOSIS — K631 Perforation of intestine (nontraumatic): Secondary | ICD-10-CM | POA: Diagnosis not present

## 2024-05-18 DIAGNOSIS — Z7901 Long term (current) use of anticoagulants: Secondary | ICD-10-CM | POA: Diagnosis not present

## 2024-05-18 DIAGNOSIS — R1032 Left lower quadrant pain: Secondary | ICD-10-CM | POA: Diagnosis not present

## 2024-05-18 DIAGNOSIS — Z8249 Family history of ischemic heart disease and other diseases of the circulatory system: Secondary | ICD-10-CM

## 2024-05-18 DIAGNOSIS — I472 Ventricular tachycardia, unspecified: Secondary | ICD-10-CM | POA: Diagnosis not present

## 2024-05-18 DIAGNOSIS — Z7951 Long term (current) use of inhaled steroids: Secondary | ICD-10-CM | POA: Diagnosis not present

## 2024-05-18 DIAGNOSIS — F1410 Cocaine abuse, uncomplicated: Secondary | ICD-10-CM | POA: Diagnosis present

## 2024-05-18 DIAGNOSIS — Z823 Family history of stroke: Secondary | ICD-10-CM

## 2024-05-18 DIAGNOSIS — F1010 Alcohol abuse, uncomplicated: Secondary | ICD-10-CM | POA: Diagnosis present

## 2024-05-18 DIAGNOSIS — Z79899 Other long term (current) drug therapy: Secondary | ICD-10-CM

## 2024-05-18 DIAGNOSIS — Z8711 Personal history of peptic ulcer disease: Secondary | ICD-10-CM

## 2024-05-18 DIAGNOSIS — K650 Generalized (acute) peritonitis: Secondary | ICD-10-CM | POA: Diagnosis present

## 2024-05-18 DIAGNOSIS — F32A Depression, unspecified: Secondary | ICD-10-CM | POA: Diagnosis present

## 2024-05-18 DIAGNOSIS — F17210 Nicotine dependence, cigarettes, uncomplicated: Secondary | ICD-10-CM | POA: Diagnosis present

## 2024-05-18 DIAGNOSIS — I110 Hypertensive heart disease with heart failure: Secondary | ICD-10-CM | POA: Diagnosis present

## 2024-05-18 DIAGNOSIS — Z6829 Body mass index (BMI) 29.0-29.9, adult: Secondary | ICD-10-CM

## 2024-05-18 LAB — URINALYSIS, ROUTINE W REFLEX MICROSCOPIC
Bilirubin Urine: NEGATIVE
Glucose, UA: NEGATIVE mg/dL
Hgb urine dipstick: NEGATIVE
Ketones, ur: 5 mg/dL — AB
Nitrite: NEGATIVE
Protein, ur: 100 mg/dL — AB
Specific Gravity, Urine: 1.031 — ABNORMAL HIGH (ref 1.005–1.030)
pH: 5 (ref 5.0–8.0)

## 2024-05-18 LAB — CBC
HCT: 40.8 % (ref 39.0–52.0)
Hemoglobin: 14.1 g/dL (ref 13.0–17.0)
MCH: 32.2 pg (ref 26.0–34.0)
MCHC: 34.6 g/dL (ref 30.0–36.0)
MCV: 93.2 fL (ref 80.0–100.0)
Platelets: 361 K/uL (ref 150–400)
RBC: 4.38 MIL/uL (ref 4.22–5.81)
RDW: 11.7 % (ref 11.5–15.5)
WBC: 11.7 K/uL — ABNORMAL HIGH (ref 4.0–10.5)
nRBC: 0 % (ref 0.0–0.2)

## 2024-05-18 LAB — COMPREHENSIVE METABOLIC PANEL WITH GFR
ALT: 21 U/L (ref 0–44)
AST: 18 U/L (ref 15–41)
Albumin: 3.1 g/dL — ABNORMAL LOW (ref 3.5–5.0)
Alkaline Phosphatase: 70 U/L (ref 38–126)
Anion gap: 13 (ref 5–15)
BUN: 14 mg/dL (ref 6–20)
CO2: 26 mmol/L (ref 22–32)
Calcium: 8.9 mg/dL (ref 8.9–10.3)
Chloride: 100 mmol/L (ref 98–111)
Creatinine, Ser: 0.88 mg/dL (ref 0.61–1.24)
GFR, Estimated: >60 mL/min (ref >60)
Glucose, Bld: 89 mg/dL (ref 70–99)
Potassium: 3.5 mmol/L (ref 3.5–5.1)
Sodium: 139 mmol/L (ref 135–145)
Total Bilirubin: 1 mg/dL (ref 0.0–1.2)
Total Protein: 7.3 g/dL (ref 6.5–8.1)

## 2024-05-18 LAB — LIPASE, BLOOD: Lipase: 27 U/L (ref 11–51)

## 2024-05-18 MED ORDER — POTASSIUM CHLORIDE IN NACL 20-0.9 MEQ/L-% IV SOLN
INTRAVENOUS | Status: AC
  Filled 2024-05-18 (×4): qty 1000

## 2024-05-18 MED ORDER — ADULT MULTIVITAMIN W/MINERALS CH
1.0000 | ORAL_TABLET | Freq: Every day | ORAL | Status: DC
  Administered 2024-05-18 – 2024-05-23 (×6): 1 via ORAL
  Filled 2024-05-18 (×6): qty 1

## 2024-05-18 MED ORDER — PIPERACILLIN-TAZOBACTAM 3.375 G IVPB
3.3750 g | Freq: Three times a day (TID) | INTRAVENOUS | Status: DC
  Administered 2024-05-18 – 2024-05-23 (×14): 3.375 g via INTRAVENOUS
  Filled 2024-05-18 (×12): qty 50

## 2024-05-18 MED ORDER — THIAMINE MONONITRATE 100 MG PO TABS
100.0000 mg | ORAL_TABLET | Freq: Every day | ORAL | Status: DC
  Administered 2024-05-18 – 2024-05-23 (×6): 100 mg via ORAL
  Filled 2024-05-18 (×6): qty 1

## 2024-05-18 MED ORDER — ENOXAPARIN SODIUM 40 MG/0.4ML IJ SOSY
40.0000 mg | PREFILLED_SYRINGE | INTRAMUSCULAR | Status: DC
  Administered 2024-05-18: 40 mg via SUBCUTANEOUS
  Filled 2024-05-18: qty 0.4

## 2024-05-18 MED ORDER — IOHEXOL 300 MG/ML  SOLN
100.0000 mL | Freq: Once | INTRAMUSCULAR | Status: AC | PRN
  Administered 2024-05-18: 100 mL via INTRAVENOUS

## 2024-05-18 MED ORDER — ONDANSETRON HCL 4 MG/2ML IJ SOLN
4.0000 mg | Freq: Four times a day (QID) | INTRAMUSCULAR | Status: DC | PRN
  Administered 2024-05-18 – 2024-05-20 (×3): 4 mg via INTRAVENOUS
  Filled 2024-05-18 (×3): qty 2

## 2024-05-18 MED ORDER — DULOXETINE HCL 60 MG PO CPEP: 60.0000 mg | ORAL_CAPSULE | Freq: Every day | ORAL | Status: DC

## 2024-05-18 MED ORDER — HYDROMORPHONE HCL 1 MG/ML IJ SOLN
1.0000 mg | INTRAMUSCULAR | Status: DC | PRN
  Administered 2024-05-18 – 2024-05-20 (×9): 1 mg via INTRAVENOUS
  Filled 2024-05-18 (×9): qty 1

## 2024-05-18 MED ORDER — NICOTINE 14 MG/24HR TD PT24
14.0000 mg | MEDICATED_PATCH | Freq: Every day | TRANSDERMAL | Status: DC
  Administered 2024-05-18 – 2024-05-22 (×5): 14 mg via TRANSDERMAL
  Filled 2024-05-18 (×5): qty 1

## 2024-05-18 MED ORDER — FLUTICASONE FUROATE-VILANTEROL 200-25 MCG/ACT IN AEPB: 1.0000 | INHALATION_SPRAY | Freq: Every day | RESPIRATORY_TRACT | Status: DC

## 2024-05-18 MED ORDER — EZETIMIBE 10 MG PO TABS
10.0000 mg | ORAL_TABLET | Freq: Every day | ORAL | Status: AC
Start: 2024-05-19 — End: ?
  Administered 2024-05-19 – 2024-05-20 (×2): 10 mg via ORAL
  Filled 2024-05-18 (×2): qty 1

## 2024-05-18 MED ORDER — HYDROXYZINE HCL 25 MG PO TABS
25.0000 mg | ORAL_TABLET | Freq: Two times a day (BID) | ORAL | Status: DC | PRN
  Administered 2024-05-18 – 2024-05-21 (×3): 25 mg via ORAL
  Filled 2024-05-18 (×3): qty 1

## 2024-05-18 MED ORDER — FENTANYL CITRATE (PF) 50 MCG/ML IJ SOSY
100.0000 ug | PREFILLED_SYRINGE | Freq: Once | INTRAMUSCULAR | Status: AC
  Administered 2024-05-18: 100 ug via INTRAVENOUS
  Filled 2024-05-18: qty 2

## 2024-05-18 MED ORDER — ALBUTEROL SULFATE (2.5 MG/3ML) 0.083% IN NEBU: 2.5000 mg | INHALATION_SOLUTION | RESPIRATORY_TRACT | Status: DC | PRN

## 2024-05-18 MED ORDER — HYDROMORPHONE HCL 1 MG/ML IJ SOLN
1.0000 mg | Freq: Once | INTRAMUSCULAR | Status: AC
  Administered 2024-05-18: 1 mg via INTRAVENOUS
  Filled 2024-05-18: qty 1

## 2024-05-18 MED ORDER — ONDANSETRON HCL 4 MG/2ML IJ SOLN
4.0000 mg | Freq: Once | INTRAMUSCULAR | Status: AC
  Administered 2024-05-18: 4 mg via INTRAVENOUS
  Filled 2024-05-18: qty 2

## 2024-05-18 MED ORDER — LORAZEPAM 2 MG/ML IJ SOLN: 1.0000 mg | INTRAMUSCULAR | Status: AC | PRN

## 2024-05-18 MED ORDER — PANTOPRAZOLE SODIUM 40 MG PO TBEC
40.0000 mg | DELAYED_RELEASE_TABLET | Freq: Every day | ORAL | Status: DC
Start: 2024-05-18 — End: 2024-05-23
  Administered 2024-05-18 – 2024-05-23 (×6): 40 mg via ORAL
  Filled 2024-05-18 (×6): qty 1

## 2024-05-18 MED ORDER — ONDANSETRON HCL 4 MG PO TABS: 4.0000 mg | ORAL_TABLET | Freq: Four times a day (QID) | ORAL | Status: DC | PRN

## 2024-05-18 MED ORDER — METHOCARBAMOL 1000 MG/10ML IJ SOLN
500.0000 mg | Freq: Three times a day (TID) | INTRAMUSCULAR | Status: DC | PRN
  Administered 2024-05-20 (×2): 500 mg via INTRAVENOUS
  Filled 2024-05-18 (×4): qty 5

## 2024-05-18 MED ORDER — HYDRALAZINE HCL 20 MG/ML IJ SOLN
5.0000 mg | Freq: Four times a day (QID) | INTRAMUSCULAR | Status: DC | PRN
  Administered 2024-05-20 – 2024-05-23 (×3): 5 mg via INTRAVENOUS
  Filled 2024-05-18 (×3): qty 1

## 2024-05-18 MED ORDER — SODIUM CHLORIDE 0.9 % IV SOLN: INTRAVENOUS | Status: DC

## 2024-05-18 MED ORDER — CLOPIDOGREL BISULFATE 75 MG PO TABS: 75.0000 mg | ORAL_TABLET | Freq: Every day | ORAL | Status: DC

## 2024-05-18 MED ORDER — METOPROLOL TARTRATE 5 MG/5ML IV SOLN: 5.0000 mg | INTRAVENOUS | Status: DC | PRN

## 2024-05-18 MED ORDER — METOPROLOL SUCCINATE ER 50 MG PO TB24: 75.0000 mg | ORAL_TABLET | Freq: Every day | ORAL | Status: DC

## 2024-05-18 MED ORDER — LORAZEPAM 1 MG PO TABS: 1.0000 mg | ORAL_TABLET | ORAL | Status: AC | PRN

## 2024-05-18 MED ORDER — FOLIC ACID 1 MG PO TABS
1.0000 mg | ORAL_TABLET | Freq: Every day | ORAL | Status: DC
  Administered 2024-05-18 – 2024-05-23 (×6): 1 mg via ORAL
  Filled 2024-05-18 (×6): qty 1

## 2024-05-18 MED ORDER — ROSUVASTATIN CALCIUM 10 MG PO TABS
40.0000 mg | ORAL_TABLET | Freq: Every day | ORAL | Status: DC
  Administered 2024-05-19 – 2024-05-23 (×5): 40 mg via ORAL
  Filled 2024-05-18 (×5): qty 4
  Filled 2024-05-18: qty 2

## 2024-05-18 MED ORDER — ACETAMINOPHEN 500 MG PO TABS
1000.0000 mg | ORAL_TABLET | Freq: Four times a day (QID) | ORAL | Status: DC | PRN
  Administered 2024-05-20 – 2024-05-22 (×5): 1000 mg via ORAL
  Filled 2024-05-18 (×6): qty 2

## 2024-05-18 MED ORDER — THIAMINE HCL 100 MG/ML IJ SOLN: 100.0000 mg | Freq: Every day | INTRAMUSCULAR | Status: DC

## 2024-05-18 MED ORDER — ASPIRIN 81 MG PO TBEC
81.0000 mg | DELAYED_RELEASE_TABLET | Freq: Every day | ORAL | Status: DC
  Administered 2024-05-20 – 2024-05-23 (×4): 81 mg via ORAL
  Filled 2024-05-18 (×4): qty 1

## 2024-05-18 MED ORDER — SENNOSIDES-DOCUSATE SODIUM 8.6-50 MG PO TABS: 1.0000 | ORAL_TABLET | Freq: Every evening | ORAL | Status: DC | PRN

## 2024-05-18 MED ORDER — FENTANYL CITRATE (PF) 50 MCG/ML IJ SOSY
25.0000 ug | PREFILLED_SYRINGE | INTRAMUSCULAR | Status: DC | PRN
  Administered 2024-05-18 – 2024-05-20 (×5): 25 ug via INTRAVENOUS
  Filled 2024-05-18 (×5): qty 1

## 2024-05-18 MED ORDER — SODIUM CHLORIDE 0.9 % IV BOLUS
1000.0000 mL | Freq: Once | INTRAVENOUS | Status: AC
  Administered 2024-05-18: 1000 mL via INTRAVENOUS

## 2024-05-18 MED ORDER — PIPERACILLIN-TAZOBACTAM 3.375 G IVPB 30 MIN
3.3750 g | Freq: Once | INTRAVENOUS | Status: AC
  Administered 2024-05-18: 3.375 g via INTRAVENOUS
  Filled 2024-05-18: qty 50

## 2024-05-18 NOTE — ED Notes (Signed)
 Patient transported to CT

## 2024-05-18 NOTE — ED Provider Notes (Signed)
 [facility] Provider Note    Event Date/Time   First MD Initiated Contact with Patient 05/18/24 1203     (approximate)  History   Chief Complaint: Abdominal Pain  HPI  Marcus [name] is a 58 y.o. male with a past medical history of CAD, CHF, hypertension, prior MI, presents to the Emergency Department for left lower quadrant abdominal pain.  According to the patient for the last 4 days or so he has been experiencing pain in the left lower quadrant.  Thought it could be due to constipation he took a laxative has had multiple bowel movements but continues to have pain.  Denies any black or bloody stool.  No diarrhea.  States some nausea but no vomiting.  No fever.  No history of diverticulitis.  No urinary symptoms.  Physical Exam   Triage Vital Signs: ED Triage Vitals  Encounter Vitals Group     BP 05/18/24 1140 110/72     Girls Systolic BP Percentile --      Girls Diastolic BP Percentile --      Boys Systolic BP Percentile --      Boys Diastolic BP Percentile --      Pulse Rate 05/18/24 1140 99     Resp 05/18/24 1140 17     Temp 05/18/24 1140 (!) 97.4 F (36.3 C)     Temp Source 05/18/24 1140 Oral     SpO2 05/18/24 1140 94 %     Weight 05/18/24 1141 165 lb 5.5 oz (75 kg)     Height 05/18/24 1141 5' 3 (1.6 m)     Head Circumference --      Peak Flow --      Pain Score 05/18/24 1140 9     Pain Loc --      Pain Education --      Exclude from Growth Chart --     Most recent vital signs: Vitals:   05/18/24 1140  BP: 110/72  Pulse: 99  Resp: 17  Temp: (!) 97.4 F (36.3 C)  SpO2: 94%    General: Awake, no distress.  CV:  Good peripheral perfusion.  Regular rate and rhythm  Resp:  Normal effort.  Equal breath sounds bilaterally.  Abd:  No distention.  Soft, moderate left lower quadrant tenderness.  ED Results / Procedures / Treatments   RADIOLOGY  I have reviewed interpreted CT images.  There appears to be a lot of  inflammation/stranding in the left lower quadrant of the abdomen. Radiology has read perforated sigmoid diverticulitis with small developing abscess.  No drainable collection.   MEDICATIONS ORDERED IN ED: Medications - No data to display   IMPRESSION / MDM / ASSESSMENT AND PLAN / ED COURSE  I reviewed the triage vital signs and the nursing notes.  Patient's presentation is most consistent with acute presentation with potential threat to life or bodily function.  Patient presents emergency department for worsening left lower quadrant pain over the last for 5 days.  Moderate tenderness to palpation in this area.  Will check labs including a CBC chemistry lipase will obtain a urinalysis to rule out UTI pyelonephritis.  Will obtain a CT scan to further evaluate.  Differential would include diverticulitis, colitis, urinary infection, hernia among other pathology.  Patient's workup in the emergency department shows a white blood cell count of 11.7, reassuring chemistry with normal LFTs, normal lipase.  Urinalysis shows some white cells but no significant sign of infection, no urinary symptoms.  Patient CT scan has resulted positive for acute diverticulitis complicated by a perforation and small developing abscess.  I have ordered IV Zosyn for the patient.  Awaiting general surgery.  I spoke with Dr. Desiderio of general surgery.  Given the patient's complicated medical history we will admit to the hospitalist service with surgery consultation.  FINAL CLINICAL IMPRESSION(S) / ED DIAGNOSES   Left lower quadrant abdominal pain Perforated diverticulitis  Note:  This document was prepared using Dragon voice recognition software and may include unintentional dictation errors.   Dorothyann Drivers, MD 05/18/24 1431

## 2024-05-18 NOTE — ED Triage Notes (Signed)
 Patient to ED via POV for lower abd pain. Ongoing x5 days with nausea on Monday as well. Last BM yesterday- small per pt.

## 2024-05-18 NOTE — ED Notes (Signed)
Surgery in to speak with pt

## 2024-05-18 NOTE — H&P (Signed)
 History and Physical    Marcus [name] FMW:[id number] DOB: April 16, 1966 DOA: 05/18/2024  PCP: Pcp, No (Confirm with patient/family/NH records and if not entered, this has to be entered at [facility] point of entry) Patient coming from: Home  I have personally briefly reviewed patient's old medical records in [facility]  Chief Complaint: Abdominal pain, nauseous vomiting  HPI: Marcus [name] is a 58 y.o. 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  Symptoms started 2 days ago, patient started with intermittent cramping-like left lower abdominal pain, had a normal bowel movement yesterday, overnight started to feel nauseous and vomited several times stomach content with worsening of left lower quadrant abdominal pain and came to the hospital.  Denies any fever chills no shortness of breath no chest pains.  ED Course: Temperature 97.4 blood pressure 131/71 O2 saturation 95% on room air.  CT abdomen pelvis showed acute perforated sigmoid diverticulitis with more developing abscess, no drainable fluid collection identified.  Blood work showed WBC 11.7, hemoglobin 14, BUN 14 creatinine 0.88.  Patient was started on Zosyn in the ED.  Review of Systems: As per HPI otherwise 14 point review of systems negative.    Past Medical History:  Diagnosis Date   CAD in native artery    a. 06/28/2014: STEMI, cath LM nl, mLAD 50%, mLCx 20%, OM1 small in size 80%, ramus 100% s/p PCI/DES 0%, EF 40%    Carotid artery disease    CHF (congestive heart failure) (HCC)    Cocaine abuse (HCC)    Depression    H/O gastric ulcer    Hyperglycemia    a. A1C 5.6%   Hypertension    Ischemic cardiomyopathy    a. EF 40% by cath 06/28/2014   MI (myocardial infarction) (HCC)    Polysubstance abuse  (HCC)    a. cocaine, tobacco, and alcohol    Tobacco abuse     Past Surgical History:  Procedure Laterality Date   CARDIAC CATHETERIZATION  06/28/2014   x1 stent   CAROTID PTA/STENT INTERVENTION Right 08/31/2022   Procedure: CAROTID PTA/STENT INTERVENTION;  Surgeon: Marea Selinda RAMAN, MD;  Location: [facility] INVASIVE CV LAB;  Service: Cardiovascular;  Laterality: Right;   CAROTID PTA/STENT INTERVENTION Left 01/03/2024   Procedure: CAROTID PTA/STENT INTERVENTION;  Surgeon: Marea Selinda RAMAN, MD;  Location: [facility] INVASIVE CV LAB;  Service: Cardiovascular;  Laterality: Left;   COLONOSCOPY N/A 08/12/2022   Procedure: COLONOSCOPY;  Surgeon: Toledo, Ladell POUR, MD;  Location: [facility] ENDOSCOPY;  Service: Gastroenterology;  Laterality: N/A;   CORONARY STENT INTERVENTION N/A 08/26/2022   Procedure: CORONARY STENT INTERVENTION;  Surgeon: Mady Bruckner, MD;  Location: [facility] INVASIVE CV LAB;  Service: Cardiovascular;  Laterality: N/A;   ESOPHAGOGASTRODUODENOSCOPY N/A 08/12/2022   Procedure: ESOPHAGOGASTRODUODENOSCOPY (EGD);  Surgeon: Toledo, Ladell POUR, MD;  Location: [facility] ENDOSCOPY;  Service: Gastroenterology;  Laterality: N/A;   LEFT HEART CATH AND CORONARY ANGIOGRAPHY Left 08/26/2022   Procedure: LEFT HEART CATH AND CORONARY ANGIOGRAPHY;  Surgeon: Mady Bruckner, MD;  Location: [facility] INVASIVE CV LAB;  Service: Cardiovascular;  Laterality: Left;   NISSEN FUNDOPLICATION     stomach ulcer surgery       reports that he has been smoking cigarettes. He started smoking about 48 years ago. He has a 48.8 pack-year smoking history. He has never used smokeless tobacco. He  reports current alcohol use of about 12.0 standard drinks of alcohol per week. He reports that he does not use drugs.  Allergies  Allergen Reactions   Oxycodone-Acetaminophen  Other (See Comments)   Codeine Rash    Family History  Problem Relation Age of Onset   Heart failure Mother    Stroke Father    Heart failure Father    Prostate cancer Brother     Heart attack Brother      Prior to Admission medications   Medication Sig Start Date End Date Taking? Authorizing Provider  apixaban  (ELIQUIS ) 5 MG TABS tablet Take 1 tablet (5 mg total) by mouth 2 (two) times daily. 05/12/24   Brown, Fallon E, NP  aspirin  EC 81 MG tablet Take 81 mg by mouth daily. Swallow whole.    [provider]  clopidogrel  (PLAVIX ) 75 MG tablet Take 1 tablet (75 mg total) by mouth daily. 05/12/24   Brown, Fallon E, NP  DULoxetine  (CYMBALTA ) 60 MG capsule Take 60 mg by mouth daily.    [provider]  ezetimibe  (ZETIA ) 10 MG tablet Take 1 tablet (10 mg total) by mouth daily. 08/26/23 05/12/24  Furth, Cadence H, PA-C  hydrOXYzine  (VISTARIL ) 25 MG capsule Take 25 mg by mouth 2 (two) times daily. 01/22/22   [provider]  irbesartan  (AVAPRO ) 150 MG tablet Take 150 mg by mouth daily. 01/21/24   [provider]  isosorbide  mononitrate (IMDUR ) 60 MG 24 hr tablet Take 1 tablet (60 mg total) by mouth 2 (two) times daily. 12/10/23   Gollan, Timothy J, MD  metoprolol  succinate (TOPROL -XL) 25 MG 24 hr tablet Take 3 tablets (75 mg total) by mouth daily. Take with or immediately following a meal. 12/10/23   Gollan, Timothy J, MD  mometasone -formoterol  (DULERA ) 200-5 MCG/ACT AERO Inhale 2 puffs into the lungs 2 (two) times daily. 08/21/23   Patel, Sona, MD  Multiple Vitamin (MULTIVITAMIN WITH MINERALS) TABS tablet Take 1 tablet by mouth daily. 08/22/23   Patel, Sona, MD  nicotine  (NICODERM CQ  - DOSED IN MG/24 HOURS) 21 mg/24hr patch Place 21 mg onto the skin daily as needed. Patient not taking: Reported on 05/12/2024 09/17/22   [provider]  nitroGLYCERIN  (NITROSTAT ) 0.4 MG SL tablet Place 1 tablet (0.4 mg total) under the tongue every 5 (five) minutes as needed for chest pain. 04/21/23   Furth, Cadence H, PA-C  pantoprazole  (PROTONIX ) 40 MG tablet Take 1 tablet (40 mg total) by mouth daily. 08/21/23   Patel, Sona, MD  QUEtiapine  (SEROQUEL ) 200 MG tablet  Take 1 tablet (200 mg total) by mouth at bedtime. Patient not taking: Reported on 05/12/2024 08/21/23   Patel, Sona, MD  rosuvastatin  (CRESTOR ) 40 MG tablet Take 1 tablet (40 mg total) by mouth daily. 05/12/24   Brown, Fallon E, NP  SPIRIVA  RESPIMAT 1.25 MCG/ACT AERS Inhale 2 sprays into the lungs daily. Patient not taking: Reported on 05/12/2024 08/21/23   Tobie Calix, MD  VENTOLIN  HFA [phone] Base) MCG/ACT inhaler Inhale 1-2 puffs into the lungs every 4 (four) hours as needed for shortness of breath or wheezing. SMARTSIG:2 Puff(s) By Mouth Every 4 Hours PRN 08/21/23   Tobie Calix, MD    Physical Exam: Vitals:   05/18/24 1525 05/18/24 1526 05/18/24 1527 05/18/24 1530  BP: (!) 142/76  (!) 142/76 131/71  Pulse: 95  95 92  Resp:   20   Temp:  98.4 F (36.9 C)    TempSrc:  Oral    SpO2:  95% 95%  Weight:      Height:        Constitutional: NAD, calm, comfortable Vitals:   05/18/24 1525 05/18/24 1526 05/18/24 1527 05/18/24 1530  BP: (!) 142/76  (!) 142/76 131/71  Pulse: 95  95 92  Resp:   20   Temp:  98.4 F (36.9 C)    TempSrc:  Oral    SpO2:   95% 95%  Weight:      Height:       Eyes: PERRL, lids and conjunctivae normal ENMT: Mucous membranes are moist. Posterior pharynx clear of any exudate or lesions.Normal dentition.  Neck: normal, supple, no masses, no thyromegaly Respiratory: clear to auscultation bilaterally, no wheezing, no crackles. Normal respiratory effort. No accessory muscle use.  Cardiovascular: Regular rate and rhythm, no murmurs / rubs / gallops. No extremity edema. 2+ pedal pulses. No carotid bruits.  Abdomen: LLQ tenderness, some guarding, no rebound, no masses palpated. No hepatosplenomegaly. Bowel sounds positive.  Musculoskeletal: no clubbing / cyanosis. No joint deformity upper and lower extremities. Good ROM, no contractures. Normal muscle tone.  Skin: no rashes, lesions, ulcers. No induration Neurologic: CN 2-12 grossly intact. Sensation intact, DTR normal.  Strength 5/5 in all 4.  Psychiatric: Normal judgment and insight. Alert and oriented x 3. Normal mood.     Labs on Admission: I have personally reviewed following labs and imaging studies  CBC: Recent Labs  Lab 05/18/24 1143  WBC 11.7*  HGB 14.1  HCT 40.8  MCV 93.2  PLT 361   Basic Metabolic Panel: Recent Labs  Lab 05/18/24 1143  NA 139  K 3.5  CL 100  CO2 26  GLUCOSE 89  BUN 14  CREATININE 0.88  CALCIUM  8.9   GFR: Estimated Creatinine Clearance: 83 mL/min (by C-G formula based on SCr of 0.88 mg/dL). Liver Function Tests: Recent Labs  Lab 05/18/24 1143  AST 18  ALT 21  ALKPHOS 70  BILITOT 1.0  PROT 7.3  ALBUMIN 3.1*   Recent Labs  Lab 05/18/24 1143  LIPASE 27   No results for input(s): AMMONIA in the last 168 hours. Coagulation Profile: No results for input(s): INR, PROTIME in the last 168 hours. Cardiac Enzymes: No results for input(s): CKTOTAL, CKMB, CKMBINDEX, TROPONINI in the last 168 hours. BNP (last 3 results) No results for input(s): PROBNP in the last 8760 hours. HbA1C: No results for input(s): HGBA1C in the last 72 hours. CBG: No results for input(s): GLUCAP in the last 168 hours. Lipid Profile: No results for input(s): CHOL, HDL, LDLCALC, TRIG, CHOLHDL, LDLDIRECT in the last 72 hours. Thyroid Function Tests: No results for input(s): TSH, T4TOTAL, FREET4, T3FREE, THYROIDAB in the last 72 hours. Anemia Panel: No results for input(s): VITAMINB12, FOLATE, FERRITIN, TIBC, IRON, RETICCTPCT in the last 72 hours. Urine analysis:    Component Value Date/Time   COLORURINE AMBER (A) 05/18/2024 1300   APPEARANCEUR HAZY (A) 05/18/2024 1300   LABSPEC 1.031 (H) 05/18/2024 1300   PHURINE 5.0 05/18/2024 1300   GLUCOSEU NEGATIVE 05/18/2024 1300   HGBUR NEGATIVE 05/18/2024 1300   BILIRUBINUR NEGATIVE 05/18/2024 1300   KETONESUR 5 (A) 05/18/2024 1300   PROTEINUR 100 (A) 05/18/2024 1300   NITRITE  NEGATIVE 05/18/2024 1300   LEUKOCYTESUR SMALL (A) 05/18/2024 1300    Radiological Exams on Admission: CT ABDOMEN PELVIS W CONTRAST Result Date: 05/18/2024 CLINICAL DATA:  Left lower quadrant abdominal pain. EXAM: CT ABDOMEN AND PELVIS WITH CONTRAST TECHNIQUE: Multidetector CT imaging of the abdomen and pelvis  was performed using the standard protocol following bolus administration of intravenous contrast. RADIATION DOSE REDUCTION: This exam was performed according to the departmental dose-optimization program which includes automated exposure control, adjustment of the mA and/or kV according to patient size and/or use of iterative reconstruction technique. CONTRAST:  OMNIPAQUE  IOHEXOL  300 MG/ML  SOLN COMPARISON:  CT dated 02/22/2024. FINDINGS: Lower chest: The visualized lung bases are clear. There is coronary vascular calcification of the LAD. Scattered pockets of pneumoperitoneum and small free fluid in the pelvis. Hepatobiliary: The liver is unremarkable no biliary dilatation. The gallbladder is unremarkable. Pancreas: Unremarkable. No pancreatic ductal dilatation or surrounding inflammatory changes. Spleen: Normal in size without focal abnormality. Adrenals/Urinary Tract: The right adrenal gland is unremarkable. Mild left adrenal thickening. There is no hydronephrosis on either side. There is symmetric enhancement and excretion of contrast by both kidneys. The visualized ureters appear unremarkable. The urinary bladder is minimally distended and grossly remarkable. Stomach/Bowel: There is sigmoid diverticulosis. Diffuse thickening and inflammatory changes of the sigmoid colon consistent with acute diverticulitis. There is apparent focal area of perforation (66/2 and coronal 42/5). Extensive perisigmoid edema. There is a small partially loculated fluid measuring 1.5 x 3 cm in the pelvis (62/2) concerning for developing abscess. No drainable fluid collection at this time. There is no bowel  obstruction. The appendix is normal. Vascular/Lymphatic: Moderate calcified and noncalcified plaque of the abdominal aorta. The IVC is unremarkable. No portal venous gas. There is no adenopathy. Reproductive: The prostate and seminal vesicles are grossly unremarkable. Other: Bilateral inguinal and umbilical fat containing hernias. Musculoskeletal: Degenerative changes of the spine. No acute osseous pathology. IMPRESSION: 1. Acute perforated sigmoid diverticulitis with small developing abscess. No drainable fluid collection at this time. Follow-up recommended to exclude underlying colonic mass. 2. No bowel obstruction. Normal appendix. 3.  Aortic Atherosclerosis (ICD10-I70.0). These results were called by telephone at the time of interpretation on 05/18/2024 at 2:10 pm to provider KEVIN PADUCHOWSKI , who verbally acknowledged these results. Electronically Signed   By: Vanetta Chou M.D.   On: 05/18/2024 14:11    EKG: Ordered  Assessment/Plan Principal Problem:   Diverticulitis Active Problems:   Diverticulitis of large intestine with abscess without bleeding  (please populate well all problems here in Problem List. (For example, if patient is on BP meds at home and you resume or decide to hold them, it is a problem that needs to be her. Same for CAD, COPD, HLD and so on)  Perforated sigmoid diverticulitis with early abscess formation Probably concurrent acute peritonitis - Discussed with surgeon, given that patient is on chronic anticoagulation, surgeon recommended hold off anticoagulation for 2 days before considering surgery.  Surgeon also offered colostomy however patient declined. - Will keep patient n.p.o., IV fluid - Continue Zosyn  History of PAF - In sinus rhythm - Hold off Eliquis  for incoming surgery, start patient on aspirin   History of cardiac stenosis status post stenting - Stent was placed in in June this year.  For now we will hold off Plavix , continue aspirin   therapy.  Alcohol abuse - CIWA protocol with as needed benzos  COPD - No acute concern, continue as needed breathing meds  DVT prophylaxis: Lovenox  Code Status: Full code Family Communication: None at bedside Disposition Plan: Patient is sick with perforated diverticulitis requiring inpatient surgical consultation IV antibiotics, expect more than 2 midnight hospital stay Consults called: General Surgeon Admission status: Telemetry admission   Cort ONEIDA Mana MD [facility] Pager [phone]  05/18/2024, 4:12 PM

## 2024-05-18 NOTE — Consult Note (Signed)
 [facility] SURGICAL CONSULTATION NOTE (initial) - cpt: 99244   HISTORY OF PRESENT ILLNESS (HPI):  58 y.o. male presented to [facility] ED today for evaluation of abdominal pain. Patient reports he had the acute onset of lower abdominal pain which was severe in nature. This seems to have been fairly constant since the onset. On Monday, he reports the pain became more diffuse in nature. This has been exacerbated with coughing and even with walking. He denied any relief. He has had subjective fever at home, nausea, and emesis. He has had a BM in the last 24 hours; no gross blood. He does have a significant cardiovascular history including CAD, CHF, HTN, ischemic cardiomyopathy (Echo on 08/20/2023 showed EF of 50-60%). He also has bilateral carotid stenosis and it seems like he is on Eliquis  and Plavix , as well as ASA. Previous abdominal surgeries positive for previous fundoplication. He did have a colonoscopy on 08/12/22 which did show diverticula in the sigmoid. Work up in the ED revealed leukocytosis to 11.7K, Hgb to 14.1, sCr - 0.88. He did have CT Abdomen/Pelvis concerning for diverticulitis with foci of free air.   Surgery is consulted by emergency medicine physician Dr. Kevin Paduchowski, MD in this context for evaluation and management of diverticulitis.  PAST MEDICAL HISTORY (PMH):  Past Medical History:  Diagnosis Date   CAD in native artery    a. 06/28/2014: STEMI, cath LM nl, mLAD 50%, mLCx 20%, OM1 small in size 80%, ramus 100% s/p PCI/DES 0%, EF 40%    Carotid artery disease    CHF (congestive heart failure) (HCC)    Cocaine abuse (HCC)    Depression    H/O gastric ulcer    Hyperglycemia    a. A1C 5.6%   Hypertension    Ischemic cardiomyopathy    a. EF 40% by cath 06/28/2014   MI (myocardial infarction) (HCC)    Polysubstance abuse (HCC)    a. cocaine, tobacco, and alcohol    Tobacco abuse      PAST SURGICAL HISTORY (PSH):  Past Surgical History:  Procedure  Laterality Date   CARDIAC CATHETERIZATION  06/28/2014   x1 stent   CAROTID PTA/STENT INTERVENTION Right 08/31/2022   Procedure: CAROTID PTA/STENT INTERVENTION;  Surgeon: Marea Selinda RAMAN, MD;  Location: [facility] INVASIVE CV [facility];  Service: Cardiovascular;  Laterality: Right;   CAROTID PTA/STENT INTERVENTION Left 01/03/2024   Procedure: CAROTID PTA/STENT INTERVENTION;  Surgeon: Marea Selinda RAMAN, MD;  Location: [facility] CV [facility];  Service: Cardiovascular;  Laterality: Left;   COLONOSCOPY N/A 08/12/2022   Procedure: COLONOSCOPY;  Surgeon: Toledo, Ladell POUR, MD;  Location: [facility] ENDOSCOPY;  Service: Gastroenterology;  Laterality: N/A;   CORONARY STENT INTERVENTION N/A 08/26/2022   Procedure: CORONARY STENT INTERVENTION;  Surgeon: Mady Bruckner, MD;  Location: [facility] CV [facility];  Service: Cardiovascular;  Laterality: N/A;   ESOPHAGOGASTRODUODENOSCOPY N/A 08/12/2022   Procedure: ESOPHAGOGASTRODUODENOSCOPY (EGD);  Surgeon: Toledo, Ladell POUR, MD;  Location: [facility] ENDOSCOPY;  Service: Gastroenterology;  Laterality: N/A;   LEFT HEART CATH AND CORONARY ANGIOGRAPHY Left 08/26/2022   Procedure: LEFT HEART CATH AND CORONARY ANGIOGRAPHY;  Surgeon: Mady Bruckner, MD;  Location: [facility]VAS[facility] CV [facility];  Service: Cardiovascular;  Laterality: Left;   NISSEN FUNDOPLICATION     stomach ulcer surgery       MEDICATIONS:  Prior to Admission medications   Medication Sig Start Date End Date Taking? Authorizing Provider  apixaban  (ELIQUIS ) 5 MG TABS tablet Take 1 tablet (5 mg total) by mouth 2 (two) times  daily. 05/12/24   Brown, Fallon E, NP  aspirin  EC 81 MG tablet Take 81 mg by mouth daily. Swallow whole.    [provider]  clopidogrel  (PLAVIX ) 75 MG tablet Take 1 tablet (75 mg total) by mouth daily. 05/12/24   Brown, Fallon E, NP  DULoxetine  (CYMBALTA ) 60 MG capsule Take 60 mg by mouth daily.    [provider]  ezetimibe  (ZETIA ) 10 MG tablet Take 1 tablet (10 mg total) by mouth daily. 08/26/23 05/12/24   Furth, Cadence H, PA-C  hydrOXYzine  (VISTARIL ) 25 MG capsule Take 25 mg by mouth 2 (two) times daily. 01/22/22   [provider]  irbesartan  (AVAPRO ) 150 MG tablet Take 150 mg by mouth daily. 01/21/24   [provider]  isosorbide  mononitrate (IMDUR ) 60 MG 24 hr tablet Take 1 tablet (60 mg total) by mouth 2 (two) times daily. 12/10/23   Gollan, Timothy J, MD  metoprolol  succinate (TOPROL -XL) 25 MG 24 hr tablet Take 3 tablets (75 mg total) by mouth daily. Take with or immediately following a meal. 12/10/23   Gollan, Timothy J, MD  mometasone -formoterol  (DULERA ) 200-5 MCG/ACT AERO Inhale 2 puffs into the lungs 2 (two) times daily. 08/21/23   Patel, Sona, MD  Multiple Vitamin (MULTIVITAMIN WITH MINERALS) TABS tablet Take 1 tablet by mouth daily. 08/22/23   Patel, Sona, MD  nicotine  (NICODERM CQ  - DOSED IN MG/24 HOURS) 21 mg/24hr patch Place 21 mg onto the skin daily as needed. Patient not taking: Reported on 05/12/2024 09/17/22   [provider]  nitroGLYCERIN  (NITROSTAT ) 0.4 MG SL tablet Place 1 tablet (0.4 mg total) under the tongue every 5 (five) minutes as needed for chest pain. 04/21/23   Furth, Cadence H, PA-C  pantoprazole  (PROTONIX ) 40 MG tablet Take 1 tablet (40 mg total) by mouth daily. 08/21/23   Patel, Sona, MD  QUEtiapine  (SEROQUEL ) 200 MG tablet Take 1 tablet (200 mg total) by mouth at bedtime. Patient not taking: Reported on 05/12/2024 08/21/23   Patel, Sona, MD  rosuvastatin  (CRESTOR ) 40 MG tablet Take 1 tablet (40 mg total) by mouth daily. 05/12/24   Brown, Fallon E, NP  SPIRIVA  RESPIMAT 1.25 MCG/ACT AERS Inhale 2 sprays into the lungs daily. Patient not taking: Reported on 05/12/2024 08/21/23   Tobie Calix, MD  VENTOLIN  HFA 108 (90 Base) MCG/ACT inhaler Inhale 1-2 puffs into the lungs every 4 (four) hours as needed for shortness of breath or wheezing. SMARTSIG:2 Puff(s) By Mouth Every 4 Hours PRN 08/21/23   Patel, Sona, MD     ALLERGIES:  Allergies  Allergen Reactions    Oxycodone-Acetaminophen  Other (See Comments)   Codeine Rash     SOCIAL HISTORY:  Social History   Socioeconomic History   Marital status: Single    Spouse name: Not on file   Number of children: Not on file   Years of education: Not on file   Highest education level: Not on file  Occupational History   Not on file  Tobacco Use   Smoking status: Every Day    Current packs/day: 1.00    Average packs/day: 1 pack/day for 48.8 years (48.8 ttl pk-yrs)    Types: Cigarettes    Start date: 07/21/1975   Smokeless tobacco: Never   Tobacco comments:    Pt. Is actively trying to quit and is wearing the patch.   Vaping Use   Vaping status: Never Used  Substance and Sexual Activity   Alcohol use: Yes    Alcohol/week: 12.0 standard  drinks of alcohol    Types: 12 Cans of beer per week   Drug use: No    Types: Cocaine, Marijuana    Comment: none in at least 2 years   Sexual activity: Not on file  Other Topics Concern   Not on file  Social History Narrative   Not on file   Social Drivers of Health   Financial Resource Strain: Not on file  Food Insecurity: No Food Insecurity (01/03/2024)   Hunger Vital Sign    Worried About Running Out of Food in the Last Year: Never true    Ran Out of Food in the Last Year: Never true  Transportation Needs: No Transportation Needs (01/03/2024)   PRAPARE - Administrator, Civil Service (Medical): No    Lack of Transportation (Non-Medical): No  Physical Activity: Not on file  Stress: Not on file  Social Connections: Not on file  Intimate Partner Violence: Not At Risk (01/03/2024)   Humiliation, Afraid, Rape, and Kick questionnaire    Fear of Current or Ex-Partner: No    Emotionally Abused: No    Physically Abused: No    Sexually Abused: No     FAMILY HISTORY:  Family History  Problem Relation Age of Onset   Heart failure Mother    Stroke Father    Heart failure Father    Prostate cancer Brother    Heart attack Brother        REVIEW OF SYSTEMS:  Review of Systems  Constitutional:  Positive for fever (Subjective). Negative for chills.  Respiratory:  Negative for cough and shortness of breath.   Cardiovascular:  Negative for chest pain and palpitations.  Gastrointestinal:  Positive for abdominal pain, nausea and vomiting. Negative for blood in stool, constipation and diarrhea.  Genitourinary:  Negative for dysuria and urgency.  All other systems reviewed and are negative.   VITAL SIGNS:  Temp:  [97.4 F (36.3 C)] 97.4 F (36.3 C) (10/30 1140) Pulse Rate:  [92-99] 92 (10/30 1438) Resp:  [17-18] 18 (10/30 1438) BP: (110-158)/(72-86) 158/86 (10/30 1438) SpO2:  [94 %-97 %] 97 % (10/30 1438) Weight:  [75 kg] 75 kg (10/30 1141)     Height: 5' 3 (160 cm) Weight: 75 kg BMI (Calculated): 29.3   INTAKE/OUTPUT:  No intake/output data recorded.  PHYSICAL EXAM:  Physical Exam Vitals and nursing note reviewed. Exam conducted with a chaperone present.  HENT:     Head: Normocephalic and atraumatic.  Eyes:     General: No scleral icterus.    Extraocular Movements: Extraocular movements intact.  Cardiovascular:     Rate and Rhythm: Normal rate.     Heart sounds: Normal heart sounds.  Pulmonary:     Effort: Pulmonary effort is normal. No respiratory distress.  Abdominal:     General: Abdomen is flat. A surgical scar is present. There is no distension.     Palpations: Abdomen is soft.     Tenderness: There is abdominal tenderness in the right lower quadrant, periumbilical area, suprapubic area and left lower quadrant.     Hernia: A hernia is present. Hernia is present in the umbilical area (soft, reducible).     Comments: Abdomen is tender throughout lower abdomen, worse in LLQ, non-distended. He does have a soft, reducible, umbilical hernia.   Genitourinary:    Comments: Deferred Skin:    General: Skin is warm and dry.  Neurological:     General: No focal deficit present.     Mental  Status: He is  oriented to person, place, and time.  Psychiatric:        Mood and Affect: Mood normal.        Behavior: Behavior normal.      Labs:     Latest Ref Rng & Units 05/18/2024   11:43 AM 02/22/2024    9:50 AM 02/21/2024    7:29 PM  CBC  WBC 4.0 - 10.5 K/uL 11.7  7.7  7.1   Hemoglobin 13.0 - 17.0 g/dL 85.8  84.8  84.6   Hematocrit 39.0 - 52.0 % 40.8  44.1  45.1   Platelets 150 - 400 K/uL 361  324  324       Latest Ref Rng & Units 05/18/2024   11:43 AM 02/22/2024    9:50 AM 02/21/2024    7:29 PM  CMP  Glucose 70 - 99 mg/dL 89  96  895   BUN 6 - 20 mg/dL 14  14  16    Creatinine 0.61 - 1.24 mg/dL 9.11  9.19  8.97   Sodium 135 - 145 mmol/L 139  136  141   Potassium 3.5 - 5.1 mmol/L 3.5  3.8  3.7   Chloride 98 - 111 mmol/L 100  100  104   CO2 22 - 32 mmol/L 26  21  26    Calcium  8.9 - 10.3 mg/dL 8.9  8.9  9.5   Total Protein 6.5 - 8.1 g/dL 7.3  7.5    Total Bilirubin 0.0 - 1.2 mg/dL 1.0  0.7    Alkaline Phos 38 - 126 U/L 70  60    AST 15 - 41 U/L 18  40    ALT 0 - 44 U/L 21  33       Imaging studies:   CT Abdomen/Pelvis (05/18/2024) personally reviewed with changes consistent with diverticulitis, there is likely a developing abscess in the pelvis, there are foci of scattered pneumoperitoneum as well, and radiologist report reviewed below:  IMPRESSION: 1. Acute perforated sigmoid diverticulitis with small developing abscess. No drainable fluid collection at this time. Follow-up recommended to exclude underlying colonic mass. 2. No bowel obstruction. Normal appendix. 3.  Aortic Atherosclerosis (ICD10-I70.0).   Assessment/Plan: (ICD-10's: K65.92) 58 y.o. male with perforated diverticulitis with foci of pneumoperitoneum and likely developing small abscess, complicated by pertinent comorbidities including significant cardiovascular comorbidities and need for anticoagulation and antiplatelet therapy.   - Appreciate medicine admission given his comorbid disease   - We will very  cautiously try and manage him conservatively first, likely only over the next 12-24 hours, given his significant cardiovascular comorbidities and need for anticoagulation significantly increase his risk for complication/bleeding. If he fails to improve, or he deteriorates in any fashion, we will need to proceed to the OR more urgently for colectomy and colostomy creation (ie: Hartmann's Procedure).  - He will need to be NPO  - IV Abx (Zosyn)   - Monitor abdominal examination; on-going bowel function  - Pain control prn; antiemetics prn  - Morning labs; monitor leukocytosis   All of the above findings and recommendations were discussed with the patient, and all of patient's questions were answered to his expressed satisfaction.  Thank you for the opportunity to participate in this patient's care.  Face-to-face time spent with the patient and care providers was 45 minutes, with more than 50% of the time spent counseling, educating, and coordinating care of the patient.      -- Arthea Platt, PA-C [facility] 05/18/2024, 3:20  PM M-F: 7am - 4pm

## 2024-05-19 ENCOUNTER — Encounter
Admission: EM | Disposition: A | Discharge status: Home / Self Care | Payer: Self-pay | Source: Home / Self Care | Attending: Internal Medicine

## 2024-05-19 ENCOUNTER — Inpatient Hospital Stay: Admitting: Anesthesiology

## 2024-05-19 DIAGNOSIS — I480 Paroxysmal atrial fibrillation: Secondary | ICD-10-CM

## 2024-05-19 DIAGNOSIS — K5720 Diverticulitis of large intestine with perforation and abscess without bleeding: Secondary | ICD-10-CM | POA: Diagnosis not present

## 2024-05-19 DIAGNOSIS — K631 Perforation of intestine (nontraumatic): Secondary | ICD-10-CM | POA: Diagnosis not present

## 2024-05-19 DIAGNOSIS — K5792 Diverticulitis of intestine, part unspecified, without perforation or abscess without bleeding: Secondary | ICD-10-CM | POA: Diagnosis not present

## 2024-05-19 DIAGNOSIS — N35919 Unspecified urethral stricture, male, unspecified site: Secondary | ICD-10-CM

## 2024-05-19 HISTORY — PX: COLECTOMY WITH COLOSTOMY CREATION/HARTMANN PROCEDURE: SHX6598

## 2024-05-19 HISTORY — PX: CYSTOSCOPY: SHX5120

## 2024-05-19 LAB — CBC
HCT: 37.1 % — ABNORMAL LOW (ref 39.0–52.0)
Hemoglobin: 12.5 g/dL — ABNORMAL LOW (ref 13.0–17.0)
MCH: 32.4 pg (ref 26.0–34.0)
MCHC: 33.7 g/dL (ref 30.0–36.0)
MCV: 96.1 fL (ref 80.0–100.0)
Platelets: 293 K/uL (ref 150–400)
RBC: 3.86 MIL/uL — ABNORMAL LOW (ref 4.22–5.81)
RDW: 11.9 % (ref 11.5–15.5)
WBC: 12 K/uL — ABNORMAL HIGH (ref 4.0–10.5)
nRBC: 0 % (ref 0.0–0.2)

## 2024-05-19 LAB — BASIC METABOLIC PANEL WITH GFR
Anion gap: 9 (ref 5–15)
BUN: 13 mg/dL (ref 6–20)
CO2: 25 mmol/L (ref 22–32)
Calcium: 8.1 mg/dL — ABNORMAL LOW (ref 8.9–10.3)
Chloride: 104 mmol/L (ref 98–111)
Creatinine, Ser: 0.95 mg/dL (ref 0.61–1.24)
GFR, Estimated: >60 mL/min (ref >60)
Glucose, Bld: 75 mg/dL (ref 70–99)
Potassium: 3.6 mmol/L (ref 3.5–5.1)
Sodium: 138 mmol/L (ref 135–145)

## 2024-05-19 LAB — TYPE AND SCREEN
ABO/RH(D): A POS
Antibody Screen: NEGATIVE

## 2024-05-19 LAB — GLUCOSE, CAPILLARY: Glucose-Capillary: 129 mg/dL — ABNORMAL HIGH (ref 70–99)

## 2024-05-19 LAB — MAGNESIUM: Magnesium: 1.8 mg/dL (ref 1.7–2.4)

## 2024-05-19 SURGERY — COLECTOMY, WITH COLOSTOMY CREATION
Anesthesia: General

## 2024-05-19 MED ORDER — ENOXAPARIN SODIUM 40 MG/0.4ML IJ SOSY
40.0000 mg | PREFILLED_SYRINGE | INTRAMUSCULAR | Status: DC
  Administered 2024-05-20 – 2024-05-23 (×4): 40 mg via SUBCUTANEOUS
  Filled 2024-05-19 (×4): qty 0.4

## 2024-05-19 MED ORDER — KETOROLAC TROMETHAMINE 30 MG/ML IJ SOLN
15.0000 mg | Freq: Four times a day (QID) | INTRAMUSCULAR | Status: DC
Start: 2024-05-20 — End: 2024-05-24
  Administered 2024-05-19 – 2024-05-22 (×2): 15 mg via INTRAVENOUS
  Filled 2024-05-19 (×2): qty 1

## 2024-05-19 MED ORDER — BUPIVACAINE-EPINEPHRINE (PF) 0.25% -1:200000 IJ SOLN
INTRAMUSCULAR | Status: AC
  Filled 2024-05-19: qty 30

## 2024-05-19 MED ORDER — DEXAMETHASONE SOD PHOSPHATE PF 10 MG/ML IJ SOLN
INTRAMUSCULAR | Status: DC | PRN
  Administered 2024-05-19: 10 mg via INTRAVENOUS

## 2024-05-19 MED ORDER — LIDOCAINE HCL (CARDIAC) PF 100 MG/5ML IV SOSY
PREFILLED_SYRINGE | INTRAVENOUS | Status: DC | PRN
  Administered 2024-05-19: 100 mg via INTRAVENOUS

## 2024-05-19 MED ORDER — BUPIVACAINE-EPINEPHRINE (PF) 0.5% -1:200000 IJ SOLN
INTRAMUSCULAR | Status: AC
  Filled 2024-05-19: qty 30

## 2024-05-19 MED ORDER — HYDROMORPHONE HCL 1 MG/ML IJ SOLN
INTRAMUSCULAR | Status: AC
  Filled 2024-05-19: qty 1

## 2024-05-19 MED ORDER — ALBUMIN HUMAN 5 % IV SOLN: INTRAVENOUS | Status: DC | PRN

## 2024-05-19 MED ORDER — ONDANSETRON HCL 4 MG/2ML IJ SOLN
INTRAMUSCULAR | Status: DC | PRN
  Administered 2024-05-19: 4 mg via INTRAVENOUS

## 2024-05-19 MED ORDER — ACETAMINOPHEN 10 MG/ML IV SOLN: 1000.0000 mg | Freq: Once | INTRAVENOUS | Status: DC | PRN

## 2024-05-19 MED ORDER — KETAMINE HCL 50 MG/5ML IJ SOSY
PREFILLED_SYRINGE | INTRAMUSCULAR | Status: AC
  Filled 2024-05-19: qty 5

## 2024-05-19 MED ORDER — ONDANSETRON HCL 4 MG/2ML IJ SOLN
INTRAMUSCULAR | Status: AC
  Filled 2024-05-19: qty 2

## 2024-05-19 MED ORDER — PHENYLEPHRINE 80 MCG/ML (10ML) SYRINGE FOR IV PUSH (FOR BLOOD PRESSURE SUPPORT)
PREFILLED_SYRINGE | INTRAVENOUS | Status: DC | PRN
  Administered 2024-05-19 (×3): 80 ug via INTRAVENOUS

## 2024-05-19 MED ORDER — PROPOFOL 1000 MG/100ML IV EMUL
INTRAVENOUS | Status: AC
  Filled 2024-05-19: qty 100

## 2024-05-19 MED ORDER — GLYCOPYRROLATE 0.2 MG/ML IJ SOLN
INTRAMUSCULAR | Status: DC | PRN
  Administered 2024-05-19: .1 mg via INTRAVENOUS

## 2024-05-19 MED ORDER — ROCURONIUM BROMIDE 10 MG/ML (PF) SYRINGE
PREFILLED_SYRINGE | INTRAVENOUS | Status: AC
  Filled 2024-05-19: qty 10

## 2024-05-19 MED ORDER — PHENYLEPHRINE HCL-NACL 20-0.9 MG/250ML-% IV SOLN
INTRAVENOUS | Status: AC
  Filled 2024-05-19: qty 250

## 2024-05-19 MED ORDER — SODIUM CHLORIDE (PF) 0.9 % IJ SOLN
INTRAMUSCULAR | Status: AC
  Filled 2024-05-19: qty 50

## 2024-05-19 MED ORDER — LIDOCAINE HCL (PF) 2 % IJ SOLN
INTRAMUSCULAR | Status: AC
  Filled 2024-05-19: qty 5

## 2024-05-19 MED ORDER — POTASSIUM CHLORIDE IN NACL 20-0.9 MEQ/L-% IV SOLN
INTRAVENOUS | Status: DC
  Filled 2024-05-19 (×6): qty 1000

## 2024-05-19 MED ORDER — FENTANYL CITRATE (PF) 100 MCG/2ML IJ SOLN
INTRAMUSCULAR | Status: AC
  Filled 2024-05-19: qty 2

## 2024-05-19 MED ORDER — ALBUMIN HUMAN 5 % IV SOLN
INTRAVENOUS | Status: AC
  Filled 2024-05-19: qty 250

## 2024-05-19 MED ORDER — MIDAZOLAM HCL (PF) 2 MG/2ML IJ SOLN
INTRAMUSCULAR | Status: DC | PRN
  Administered 2024-05-19: 2 mg via INTRAVENOUS

## 2024-05-19 MED ORDER — MIDAZOLAM HCL 2 MG/2ML IJ SOLN
INTRAMUSCULAR | Status: AC
  Filled 2024-05-19: qty 2

## 2024-05-19 MED ORDER — PROPOFOL 10 MG/ML IV BOLUS
INTRAVENOUS | Status: DC | PRN
  Administered 2024-05-19: 150 mg via INTRAVENOUS

## 2024-05-19 MED ORDER — TRAMADOL HCL 50 MG PO TABS: 50.0000 mg | ORAL_TABLET | Freq: Once | ORAL | Status: DC

## 2024-05-19 MED ORDER — PIPERACILLIN-TAZOBACTAM 3.375 G IVPB
INTRAVENOUS | Status: AC
  Filled 2024-05-19: qty 50

## 2024-05-19 MED ORDER — PHENYLEPHRINE HCL-NACL 20-0.9 MG/250ML-% IV SOLN
INTRAVENOUS | Status: DC | PRN
  Administered 2024-05-19: 30 ug/min via INTRAVENOUS

## 2024-05-19 MED ORDER — SODIUM CHLORIDE (PF) 0.9 % IJ SOLN
INTRAMUSCULAR | Status: DC | PRN
  Administered 2024-05-19: 100 mL via INTRAMUSCULAR

## 2024-05-19 MED ORDER — SUGAMMADEX SODIUM 200 MG/2ML IV SOLN
INTRAVENOUS | Status: DC | PRN
  Administered 2024-05-19: 200 mg via INTRAVENOUS

## 2024-05-19 MED ORDER — PROPOFOL 10 MG/ML IV BOLUS
INTRAVENOUS | Status: AC
  Filled 2024-05-19: qty 20

## 2024-05-19 MED ORDER — KETAMINE HCL 50 MG/5ML IJ SOSY
PREFILLED_SYRINGE | INTRAMUSCULAR | Status: DC | PRN
  Administered 2024-05-19: 30 mg via INTRAVENOUS
  Administered 2024-05-19: 20 mg via INTRAVENOUS

## 2024-05-19 MED ORDER — ALBUTEROL SULFATE HFA 108 (90 BASE) MCG/ACT IN AERS
INHALATION_SPRAY | RESPIRATORY_TRACT | Status: DC | PRN
  Administered 2024-05-19: 4 via RESPIRATORY_TRACT

## 2024-05-19 MED ORDER — ROCURONIUM BROMIDE 100 MG/10ML IV SOLN
INTRAVENOUS | Status: DC | PRN
  Administered 2024-05-19: 50 mg via INTRAVENOUS
  Administered 2024-05-19: 30 mg via INTRAVENOUS
  Administered 2024-05-19: 20 mg via INTRAVENOUS
  Administered 2024-05-19: 10 mg via INTRAVENOUS
  Administered 2024-05-19: 20 mg via INTRAVENOUS
  Administered 2024-05-19: 10 mg via INTRAVENOUS

## 2024-05-19 MED ORDER — LACTATED RINGERS IV SOLN: INTRAVENOUS | Status: DC | PRN

## 2024-05-19 MED ORDER — HYDROMORPHONE HCL 1 MG/ML IJ SOLN
INTRAMUSCULAR | Status: DC | PRN
  Administered 2024-05-19 (×2): .5 mg via INTRAVENOUS

## 2024-05-19 MED ORDER — HYDROMORPHONE HCL 1 MG/ML IJ SOLN
0.2500 mg | INTRAMUSCULAR | Status: DC | PRN
  Administered 2024-05-19 (×2): 0.5 mg via INTRAVENOUS

## 2024-05-19 MED ORDER — SEVOFLURANE IN SOLN
RESPIRATORY_TRACT | Status: AC
  Filled 2024-05-19: qty 250

## 2024-05-19 MED ORDER — ACETAMINOPHEN 10 MG/ML IV SOLN
INTRAVENOUS | Status: AC
  Filled 2024-05-19: qty 100

## 2024-05-19 MED ORDER — FENTANYL CITRATE (PF) 100 MCG/2ML IJ SOLN
INTRAMUSCULAR | Status: DC | PRN
  Administered 2024-05-19: 100 ug via INTRAVENOUS
  Administered 2024-05-19 (×2): 50 ug via INTRAVENOUS

## 2024-05-19 MED ORDER — LACTATED RINGERS IV SOLN: INTRAVENOUS | Status: DC

## 2024-05-19 SURGICAL SUPPLY — 47 items
CATH PEDI SILICON 3CC 8FR (CATHETERS) IMPLANT
DRAIN CHANNEL JP 19F RND 3/16 (MISCELLANEOUS) IMPLANT
DRAIN PENROSE 12X.25 LTX STRL (MISCELLANEOUS) IMPLANT
DRAPE LAPAROTOMY 100X77 ABD (DRAPES) ×2 IMPLANT
DRAPE UNDER BUTTOCK W/FLU (DRAPES) ×2 IMPLANT
DRAPE WARM FLUID 44X44 (DRAPES) ×2 IMPLANT
DRSG OPSITE POSTOP 4X14 (GAUZE/BANDAGES/DRESSINGS) IMPLANT
DRSG TEGADERM 4X4.75 (GAUZE/BANDAGES/DRESSINGS) IMPLANT
ELECTRODE CAUTERY BLDE TIP 2.5 (TIP) ×2 IMPLANT
ELECTRODE EZSTD 165MM 6.5IN (MISCELLANEOUS) IMPLANT
ELECTRODE REM PT RTRN 9FT ADLT (ELECTROSURGICAL) ×2 IMPLANT
EVACUATOR SILICONE 100CC (DRAIN) IMPLANT
GAUZE 4X4 16PLY ~~LOC~~+RFID DBL (SPONGE) IMPLANT
GAUZE SPONGE 4X4 12PLY STRL (GAUZE/BANDAGES/DRESSINGS) IMPLANT
GLOVE SURG SYN 7.0 PF PI (GLOVE) ×4 IMPLANT
GLOVE SURG SYN 7.5 PF PI (GLOVE) ×4 IMPLANT
GOWN STRL REUS W/ TWL LRG LVL3 (GOWN DISPOSABLE) ×8 IMPLANT
KIT OSTOMY 2 PC DRNBL 2.25 STR (WOUND CARE) IMPLANT
KIT TURNOVER KIT A (KITS) ×2 IMPLANT
LABEL OR SOLS (LABEL) ×2 IMPLANT
LIGASURE IMPACT 36 18CM CVD LR (INSTRUMENTS) IMPLANT
MANIFOLD NEPTUNE II (INSTRUMENTS) ×2 IMPLANT
NDL HYPO 22X1.5 SAFETY MO (MISCELLANEOUS) ×2 IMPLANT
NEEDLE HYPO 22X1.5 SAFETY MO (MISCELLANEOUS) ×2 IMPLANT
PACK BASIN MAJOR ARMC (MISCELLANEOUS) ×2 IMPLANT
PACK COLON CLEAN CLOSURE (MISCELLANEOUS) ×2 IMPLANT
SEPRAFILM MEMBRANE 5X6 (MISCELLANEOUS) IMPLANT
SOLN 0.9% NACL POUR BTL 1000ML (IV SOLUTION) ×2 IMPLANT
SPONGE DRAIN TRACH 4X4 STRL 2S (GAUZE/BANDAGES/DRESSINGS) IMPLANT
SPONGE T-LAP 18X18 ~~LOC~~+RFID (SPONGE) ×4 IMPLANT
STAPLER CIRCULAR MANUAL XL 29 (STAPLE) IMPLANT
STAPLER CVD CUT BLU 40 RELOAD (ENDOMECHANICALS) IMPLANT
STAPLER PROXIMATE 75MM BLUE (STAPLE) IMPLANT
STAPLER RELOADABLE 65 2-0 SUT (MISCELLANEOUS) IMPLANT
STAPLER SKIN PROX 35W (STAPLE) ×2 IMPLANT
SUT ETHILON 2 0 FS 18 (SUTURE) IMPLANT
SUT MON AB 3-0 SH27 (SUTURE) ×2 IMPLANT
SUT PDS AB 1 CT1 36 (SUTURE) ×4 IMPLANT
SUT SILK 2-0 (SUTURE) ×2 IMPLANT
SUT SILK 3 0 SH CR/8 (SUTURE) ×2 IMPLANT
SUT VIC AB 1 CTX 27 (SUTURE) ×2 IMPLANT
SUT VICRYL 3-0 CR8 SH (SUTURE) IMPLANT
SYR 10ML LL (SYRINGE) ×2 IMPLANT
TRAP FLUID SMOKE EVACUATOR (MISCELLANEOUS) ×2 IMPLANT
TRAY FOLEY MTR SLVR 16FR STAT (SET/KITS/TRAYS/PACK) ×2 IMPLANT
VALVE UROSEAL ADJ ENDO (VALVE) IMPLANT
WATER STERILE IRR 500ML POUR (IV SOLUTION) ×2 IMPLANT

## 2024-05-19 NOTE — Consult Note (Signed)
 WOC team consulted for new end colostomy placed by Dr. Desiderio 05/19/2024.  WOC RN marked prior to surgery.    WOC team will begin education and support of ostomy Monday 05/22/2024.   Thank you,    Powell Bar MSN, RN-BC, [employer]

## 2024-05-19 NOTE — Progress Notes (Addendum)
 [facility] SURGICAL PROGRESS NOTE  Hospital Day(s): 1.   Interval History: Patient seen and examined, no acute events or new complaints overnight. Patient reports he continues to have relatively unchanged abdominal pain. 9-100/10. He reports about an hour of relief with pain medications but the pain returns quickly. No fever, chills, nausea, emesis. He is still tachycardic. He continues to have leukocytosis; WBC 12.0K. Hgb to 12.5. Renal function normal; sCr - 0.95; UO - 600 ccs + unmeasured. No electrolyte derangements. He is NPO. Zosyn.   Review of Systems:  Constitutional: denies fever, chills  HEENT: denies cough or congestion  Respiratory: denies any shortness of breath  Cardiovascular: denies chest pain or palpitations  Gastrointestinal: + abdominal pain, denied N/V Genitourinary: denies burning with urination or urinary frequency Musculoskeletal: denies pain, decreased motor or sensation  Vital signs in last 24 hours: [min-max] current  Temp:  [97.4 F (36.3 C)-98.9 F (37.2 C)] 98.6 F (37 C) (10/31 0457) Pulse Rate:  [92-118] 101 (10/31 0457) Resp:  [16-20] 16 (10/31 0457) BP: (110-158)/(69-96) 129/70 (10/31 0457) SpO2:  [89 %-100 %] 89 % (10/31 0457) Weight:  [75 kg] 75 kg (10/30 1141)     Height: 5' 3 (160 cm) Weight: 75 kg BMI (Calculated): 29.3   Intake/Output last 2 shifts:  10/30 0701 - 10/31 0700 In: 1214.9 [I.V.:1172.1; IV Piggyback:42.8] Out: 600 [Urine:600]   Physical Exam:  Constitutional: alert, cooperative and no distress  HENT: normocephalic without obvious abnormality  Eyes: PERRL, EOM's grossly intact and symmetric  Respiratory: breathing non-labored at rest  Cardiovascular: regular rate and sinus rhythm  Gastrointestinal: soft, continued abdominal pain, worse in the LLQ, non-distended   Labs:     Latest Ref Rng & Units 05/19/2024    4:19 AM 05/18/2024   11:43 AM 02/22/2024    9:50 AM  CBC  WBC 4.0 - 10.5 K/uL 12.0  11.7  7.7    Hemoglobin 13.0 - 17.0 g/dL 87.4  85.8  84.8   Hematocrit 39.0 - 52.0 % 37.1  40.8  44.1   Platelets 150 - 400 K/uL 293  361  324       Latest Ref Rng & Units 05/19/2024    4:19 AM 05/18/2024   11:43 AM 02/22/2024    9:50 AM  CMP  Glucose 70 - 99 mg/dL 75  89  96   BUN 6 - 20 mg/dL 13  14  14    Creatinine 0.61 - 1.24 mg/dL 9.04  9.11  9.19   Sodium 135 - 145 mmol/L 138  139  136   Potassium 3.5 - 5.1 mmol/L 3.6  3.5  3.8   Chloride 98 - 111 mmol/L 104  100  100   CO2 22 - 32 mmol/L 25  26  21    Calcium  8.9 - 10.3 mg/dL 8.1  8.9  8.9   Total Protein 6.5 - 8.1 g/dL  7.3  7.5   Total Bilirubin 0.0 - 1.2 mg/dL  1.0  0.7   Alkaline Phos 38 - 126 U/L  70  60   AST 15 - 41 U/L  18  40   ALT 0 - 44 U/L  21  33      Imaging studies: No new pertinent imaging studies   Assessment/Plan: (ICD-10's: K8.92) 58 y.o. male with perforated diverticulitis with foci of pneumoperitoneum and likely developing small abscess, complicated by pertinent comorbidities including significant cardiovascular comorbidities and need for anticoagulation and antiplatelet therapy.               -  Unfortunately, he has failed to make any clinical improvements and his abdominal examination remains markedly concerning. We will plan to proceed to the OR for exploratory laparotomy with colectomy and colostomy creation today with Dr Desiderio pending OR/Anesthesia availability   - All risks (significant cardiovascular history), benefits, and alternatives to above procedure(s) were discussed with the patient, all of his questions were answered to his expressed satisfaction, patient expresses he wishes to proceed, and informed consent was obtained.  -  - Continue NPO; IVF support              - IV Abx (Zosyn)             - Monitor abdominal examination; on-going bowel function             - Pain control prn; antiemetics prn  - We will ask pharmacy about reversing anticoagulation              - Further management per primary  service; we will follow  All of the above findings and recommendations were discussed with the patient, and the medical team, and all of patient's questions were answered to his expressed satisfaction.  -- Arthea Platt, PA-C [facility] 05/19/2024, 7:49 AM M-F: 7am - 4pm

## 2024-05-19 NOTE — Hospital Course (Addendum)
 Marcus [name] is a 58 y.o. 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  CT scan showed perforated sigmoid diverticulosis. Exploratory laparotomy with Hetty procedure was performed 10/31.  Patient condition has improved, had a significant bowel movement from ostomy bag.  Tolerating diet.  Medically stable for discharge.

## 2024-05-19 NOTE — Transfer of Care (Signed)
 Immediate Anesthesia Transfer of Care Note  Patient: Marcus [name]  Procedure(s) Performed: COLECTOMY, WITH COLOSTOMY CREATION CYSTOSCOPY, FLEXIBLE  Patient Location: PACU  Anesthesia Type:General  Level of Consciousness: awake, alert , oriented, and patient cooperative  Airway & Oxygen Therapy: Patient Spontanous Breathing and Patient connected to face mask oxygen  Post-op Assessment: Report given to RN, Post -op Vital signs reviewed and stable, and Patient moving all extremities X 4  Post vital signs: Reviewed and stable  Last Vitals:  Vitals Value Taken Time  BP 108/54 05/19/24 16:42  Temp 37.3 C 05/19/24 16:40  Pulse 104 05/19/24 16:42  Resp 23 05/19/24 16:42  SpO2 92 % 05/19/24 16:42  Vitals shown include unfiled device data.  Last Pain:  Vitals:   05/19/24 1141  TempSrc: Temporal  PainSc:          Complications: No notable events documented.

## 2024-05-19 NOTE — Consult Note (Signed)
 WOC Nurse requested for preoperative stoma site marking  Discussed surgical procedure and stoma creation with patient.   Explained role of the WOC nurse team.  Provided the patient with educational booklet and provided samples of pouching options.   Answered patient and family questions.   Examined patient lying and sitting in order to place the marking in the patient's visual field, away from any creases or abdominal contour issues and within the rectus muscle.  Attempted to mark below the patient's belt line.   Marked for colostomy in the LLQ  8 cm to the left of the umbilicus and 2 cm above/below the umbilicus.  Marked for ileostomy in the RLQ 7 cm to the right of the umbilicus and 2 cm above/below the umbilicus.  Patient's abdomen cleansed with CHG wipes at site markings, allowed to air dry prior to marking. Covered mark with thin film transparent dressing to preserve mark until date of surgery.   WOC Nurse team will follow up with patient after surgery for continue ostomy care and teaching.   Thank-you,  Lela Holm RN, CNS, [employer], MSN.  (Phone [phone])

## 2024-05-19 NOTE — Anesthesia Preprocedure Evaluation (Addendum)
 Anesthesia Evaluation  Patient identified by MRN, date of birth, ID band Patient awake    Reviewed: Allergy & Precautions, NPO status , Patient's Chart, lab work & pertinent test results  History of Anesthesia Complications Negative for: history of anesthetic complications  Airway Mallampati: IV   Neck ROM: Full    Dental  (+) Missing, Chipped   Pulmonary Current Smoker (1 ppd) and Patient abstained from smoking.   Pulmonary exam normal breath sounds clear to auscultation       Cardiovascular hypertension, + CAD (s/p MI and stents), + Peripheral Vascular Disease (carotid stenosis s/p stent 08/2022) and +CHF (preserved EF)  Normal cardiovascular exam+ dysrhythmias (a fib on Eliquis )  Rhythm:Regular Rate:Normal  Myocardial perfusion 02/13/22:  Pharmacological myocardial perfusion imaging study with no significant  ischemia Large region fixed defect lateral wall consistent with prior MI Normal wall motion, EF estimated at 52% No EKG changes concerning for ischemia at peak stress or in recovery. Moderate risk scan given size of prior MI    Neuro/Psych  PSYCHIATRIC DISORDERS  Depression    Alcohol and cocaine use disorder (EtOH approx 12 pack per day, last used on 05/13/24; cocaine none in several months)    GI/Hepatic PUD,GERD (s/p Nissen)  ,,  Endo/Other  negative endocrine ROS    Renal/GU negative Renal ROS     Musculoskeletal   Abdominal   Peds  Hematology negative hematology ROS (+)   Anesthesia Other Findings Cardiology note 12/10/23:  Coronary disease with stable angina Currently with no symptoms of angina. No further workup at this time. Continue current medication regimen.  Recent evaluation in the emergency room in the setting of severe stress Periodically calling the police on his roommates, unable to evict them due to financial issues Recent ischemic workup March 2025 with no significant ischemia   Essential  hypertension  No changes made to the medications. Difficulty affording some of his medications, scheduled to receive his disability check third of every month   Hyperlipidemia Recommend he stay on his Crestor  40 daily and Zetia    Smoker We have encouraged him to continue to work on weaning his cigarettes and smoking cessation. He will continue to work on this and does not want any assistance with chantix.   Reproductive/Obstetrics                              Anesthesia Physical Anesthesia Plan  ASA: 3  Anesthesia Plan: General   Post-op Pain Management:    Induction: Intravenous  PONV Risk Score and Plan: 1 and Ondansetron , Dexamethasone  and Treatment may vary due to age or medical condition  Airway Management Planned: Oral ETT  Additional Equipment:   Intra-op Plan:   Post-operative Plan: Extubation in OR  Informed Consent: I have reviewed the patients History and Physical, chart, labs and discussed the procedure including the risks, benefits and alternatives for the proposed anesthesia with the patient or authorized representative who has indicated his/her understanding and acceptance.     Dental advisory given  Plan Discussed with: CRNA  Anesthesia Plan Comments: (Patient consented for risks of anesthesia including but not limited to:  - adverse reactions to medications - damage to eyes, teeth, lips or other oral mucosa - nerve damage due to positioning  - sore throat or hoarseness - damage to heart, brain, nerves, lungs, other parts of body or loss of life  Informed patient about role of CRNA in peri- and intra-operative care.  Patient voiced understanding.)         Anesthesia Quick Evaluation

## 2024-05-19 NOTE — Progress Notes (Signed)
 Patient received from PACU s/p abd sx with midline inc, honeycomb dressing and pinrose drain in place. F/C to [facility]. Colostomy with stoma beefy red. Patient lethargic, arouses to verbal stimuli, desats when he doses off, on 2 lpm via . Mouth breather.  Marcus [name] V Nicole Hafley

## 2024-05-19 NOTE — Anesthesia Postprocedure Evaluation (Signed)
 Anesthesia Post Note  Patient: Marcus [name]  Procedure(s) Performed: COLECTOMY, WITH COLOSTOMY CREATION CYSTOSCOPY, FLEXIBLE  Patient location during evaluation: PACU Anesthesia Type: General Level of consciousness: awake and alert Pain management: pain level controlled Vital Signs Assessment: post-procedure vital signs reviewed and stable Respiratory status: spontaneous breathing, nonlabored ventilation, respiratory function stable and patient connected to nasal cannula oxygen Cardiovascular status: blood pressure returned to baseline and stable Postop Assessment: no apparent nausea or vomiting Anesthetic complications: no   No notable events documented.   Last Vitals:  Vitals:   05/19/24 1715 05/19/24 1722  BP: (!) 112/57   Pulse: 89 93  Resp: 12 20  Temp:    SpO2: 94% 97%    Last Pain:  Vitals:   05/19/24 1722  TempSrc:   PainSc: 5                  Lendia LITTIE Mae

## 2024-05-19 NOTE — Anesthesia Procedure Notes (Signed)
 Procedure Name: Intubation Date/Time: 05/19/2024 12:35 PM  Performed by: Myra Lawless, CRNAPre-anesthesia Checklist: Patient identified, Patient being monitored, Timeout performed, Emergency Drugs available and Suction available Patient Re-evaluated:Patient Re-evaluated prior to induction Oxygen Delivery Method: Circle system utilized Preoxygenation: Pre-oxygenation with 100% oxygen Induction Type: IV induction Ventilation: Mask ventilation without difficulty Laryngoscope Size: Mac and 4 Grade View: Grade I Tube type: Oral Tube size: 7.0 mm Number of attempts: 1 Airway Equipment and Method: Stylet Placement Confirmation: ETT inserted through vocal cords under direct vision, positive ETCO2 and breath sounds checked- equal and bilateral Secured at: 21 cm Tube secured with: Tape Dental Injury: Teeth and Oropharynx as per pre-operative assessment

## 2024-05-19 NOTE — Op Note (Addendum)
 Procedure Date:  05/19/2024  Pre-operative Diagnosis:  perforated sigmoid diverticulitis with abscess  Post-operative Diagnosis: perforated sigmoid diverticulitis with abscess  Procedure:  Exploratory Laparotomy, Hartmann's procedure  Surgeon:  Aloysius Sheree Plant, MD  Assistant:  Arthea Platt, PA-C  Anesthesia:  General endotracheal  Estimated Blood Loss:  50 ml  Specimens:  Sigmoid colon  Complications:  None  Indications for Procedure:  This is a 58 y.o. male who presents with abdominal pain and peritonitis and workup revealing perforated diverticulitis.  The risks of bleeding, abscess or infection, injury to surrounding structures, and need for further procedures were all discussed with the patient and was willing to proceed.  Description of Procedure: The patient was correctly identified in the preoperative area and brought into the operating room.  The patient was placed supine with VTE prophylaxis in place.  Appropriate time-outs were performed.  Anesthesia was induced and the patient was intubated.  Foley catheter was attempted by OR RN and myself, but there was resistance.  Dr. Georganne with Urology did a cystoscopy and foley catheter placement.  Appropriate antibiotics were infused.  The abdomen was prepped and draped in a sterile fashion.  A midline incision was made and electrocautery was used to dissect down the subcutaneous tissue to the fascia.  The fascia was incised and extended superiorly and inferiorly.  Upon entering the abdomen (organ space), I encountered an abscess in the left lower quadrant at the site of the sigmoid perforated diverticulitis.  The sigmoid colon was very edematous and thickened.  The omentum and small bowel were mobilized cephalad.  A distal window in the mesentery of the rectosigmoid junction was created and a Contour green load stapler was used to transect.  2-0 Prolene sutures were placed to mark the staple line.  Then LigaSure was used to take the  mesentery of the sigmoid colon going proximally, right adjacent to the colon to avoid any injury to the ureter.  The colon was then mobilized further proximally by diving at the [facility] line of Toldt going towards the splenic flexure.  No injury was noted at that point.  A window in the mesentery of the descending colon was created at the planned point of transection, and the colon was resected using a blue load 75 mm stapler.  The descending colon had a good length and reach to the abdominal wall.    The abdomen was then thoroughly irrigated using warm saline and Vashe solution.  A skin incision was created at the site of planned end colostomy and cautery was used to divide the anterior and posterior rectus sheaths and a Jenna was used to pass the descending colon through the opening.  A 19 Fr Blake drain was placed in pelvis and left lower quadrant.   We then proceeded with our clean closure by changing gown and gloves and getting new instruments.  100 ml of Exparel solution mixed with 0.5% bupivacaine with epi and saline was infiltrated onto the peritoneum, fascia, and subcutaneous tissue.  The fascia was then closed using #1 PDS sutures.  The midline wound was irrigated and closed over a 1/4 inch penrose drain using 3-0 Vicryl and skin staples.  The drain was secured using 2-0 nylon suture.    We then proceeded to mature our end colostomy in standard Brooke fashion.  The midline wound was cleaned and dressed with Honeycomb dressing and the drain with 4x4 gauze and tegaderm.  Ostomy appliance was applied.  The patient was emerged from anesthesia and extubated  and brought to the recovery room for further management.  The patient tolerated the procedure well and all counts were correct at the end of the case.  Please note that Mr. Terryl was scrubbed in for the entirety of the procedure and his assistance was critical due to the complex nature of this surgery.  He assisted from initial opening,  dissection, resection of colon, washout, and abdominal closure.  Aloysius Sheree Plant, MD

## 2024-05-19 NOTE — Progress Notes (Signed)
  Progress Note   Patient: Marcus [name] FMW:[id number] DOB: June 30, 1966 DOA: 05/18/2024     1 DOS: the patient was seen and examined on 05/19/2024   Brief hospital course: CAPTAIN BLUCHER is a 58 y.o. 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  CT scan showed perforated sigmoid diverticulosis. Patient to have surgery today.   Principal Problem:   Diverticulitis Active Problems:   Diverticulitis of large intestine with abscess without bleeding   Assessment and Plan: Sigmoid diverticulitis with perforation. Acute peritonitis secondary to bowel perforation. patient is going to surgery today, continue Zosyn for now. Patient last dose of Eliquis  was Wednesday morning, this is over 48 hours.  Patient is safe to go to surgery.  Paroxysmal atrial fibrillation. Eliquis  on hold dose to surgery.  Coronary artery disease. Carotid stenosis status post stenting. Condition stable.  Alcohol abuse No evidence of alcohol withdrawal.  Continue CIWA protocol  COPD. No exacerbation.  Overweight with BMI 29.29.     Subjective:  Patient complains of severe pain in the stomach, with some nausea.  No vomiting.  Physical Exam: Vitals:   05/19/24 0817 05/19/24 0847 05/19/24 1141 05/19/24 1144  BP: 124/71  138/72   Pulse: (!) 102  92   Resp: 16  (!) 26   Temp: 98.2 F (36.8 C)  98.7 F (37.1 C)   TempSrc:   Temporal   SpO2: 90% 96% 94%   Weight:    75 kg  Height:    5' 3 (1.6 m)   General exam: Appears calm and comfortable  Respiratory system: Clear to auscultation. Respiratory effort normal. Cardiovascular system: S1 & S2 heard, RRR. No JVD, murmurs, rubs, gallops or clicks. No pedal edema. Gastrointestinal system: Abdomen is nondistended, soft and diffuse  tender and rebound tenderness no organomegaly or masses felt. No bowel sounds heard. Central nervous system: Alert and oriented. No focal neurological deficits. Extremities: Symmetric 5 x 5 power. Skin: No rashes, lesions or ulcers Psychiatry: Judgement and insight appear normal. Mood & affect appropriate.    Data Reviewed:  Reviewed CT scan results and lab results.  Family Communication: None  Disposition: Status is: Inpatient Remains inpatient appropriate because: Severity of disease, IV antibiotics, inpatient procedure     Time spent: 50 minutes  Author: Murvin Mana, MD 05/19/2024 12:04 PM  For on call review [email or web address].

## 2024-05-19 NOTE — Op Note (Addendum)
 Date of procedure: 05/19/24  Preoperative diagnosis:  Difficult foley catheter placement   Postoperative diagnosis:  Urethral stricture   Procedure: Cystoscopy Complex Foley catheter placement  Surgeon: Penne Skye, MD  Anesthesia: General  Complications: None  Intraoperative findings:  Abnormal physical exam- pan urethral nodular firmness on palpation Distal urethroscopy - eccentric penile urethral narrowing, questionable mass effect vs atypical long-urethral stricture. Unable to traverse scope past distal pendulous urethra Ultimately, able to navigate a 82F indwelling catheter into the bladder, 10cc in balloon   Description of procedure: Marcus [name] is a 58 y.o. patient was undergoing a colectomy/colostomy for perforated diverticulitis in OR1. Urology consulted during initial pre due to difficult Foley catheter placement. Patient was asleep under general anesthesia in the supine position on my arrival. Initial exam showed a circumcised phallus. Notably, exam revealed abnormally firm, nodular lesions along the length of the penile urethra. Lesions were quite fixed, seemed contiguous with urethra. Initial passages of 44F and 76F straight/coude catheters were unsuccessful. Next, we performed flexible cystoscopy although this was only able to be passed into the distal penile urethra, just proximal to the fossa. We noted eccentric narrowings of various caliber along the course of the penile urethra. We were unable to assess more proximally. A flexible ureteroscope was called for-- although prior to insertion, we were able to navigate an indwelling 82F straight catheter into the bladder with light yellow urine return. 10cc sterile water placed. This concluded the procedure and the room was returned to general surgery.    Plan:  - May follow standard post-op catheter removal from Gen Surg standpoint - Patient needs to follow up in Urology clinic. Need to establish his full urologic  history - Concern for significant urethral pathology. May need to consider RUG vs MRI penis for further evaluation  Penne Skye, MD

## 2024-05-19 NOTE — Progress Notes (Signed)
   05/18/24 2013  Assess: if the MEWS score is Yellow or Red  Were vital signs accurate and taken at a resting state? Yes  Does the patient meet 2 or more of the SIRS criteria? No  MEWS guidelines implemented  Yes, yellow  Treat  MEWS Interventions Considered administering scheduled or prn medications/treatments as ordered  Take Vital Signs  Increase Vital Sign Frequency  Yellow: Q2hr x1, continue Q4hrs until patient remains green for 12hrs  Escalate  MEWS: Escalate Yellow: Discuss with charge nurse and consider notifying provider and/or RRT  Provider Notification  Provider Name/Title Lawence, MD  Date Provider Notified 05/18/24  Time Provider Notified 2316  Method of Notification  (secure chat)  Notification Reason Other (Comment) (believe elevated HR b/c of pain)  Provider response See new orders  Date of Provider Response 05/18/24   Believe elevated HR b/c of severe pain not being controlled.  Mansy gave new order for Fentanyl  but pt still prefers dilaudid .

## 2024-05-20 DIAGNOSIS — I480 Paroxysmal atrial fibrillation: Secondary | ICD-10-CM | POA: Diagnosis not present

## 2024-05-20 DIAGNOSIS — K5720 Diverticulitis of large intestine with perforation and abscess without bleeding: Secondary | ICD-10-CM | POA: Diagnosis not present

## 2024-05-20 DIAGNOSIS — I4729 Other ventricular tachycardia: Secondary | ICD-10-CM | POA: Diagnosis not present

## 2024-05-20 DIAGNOSIS — E663 Overweight: Secondary | ICD-10-CM | POA: Insufficient documentation

## 2024-05-20 LAB — CBC
HCT: 40.1 % (ref 39.0–52.0)
Hemoglobin: 13.3 g/dL (ref 13.0–17.0)
MCH: 32.4 pg (ref 26.0–34.0)
MCHC: 33.2 g/dL (ref 30.0–36.0)
MCV: 97.6 fL (ref 80.0–100.0)
Platelets: 287 K/uL (ref 150–400)
RBC: 4.11 MIL/uL — ABNORMAL LOW (ref 4.22–5.81)
RDW: 11.8 % (ref 11.5–15.5)
WBC: 15 K/uL — ABNORMAL HIGH (ref 4.0–10.5)
nRBC: 0 % (ref 0.0–0.2)

## 2024-05-20 LAB — BASIC METABOLIC PANEL WITH GFR
Anion gap: 13 (ref 5–15)
BUN: 18 mg/dL (ref 6–20)
CO2: 22 mmol/L (ref 22–32)
Calcium: 8.5 mg/dL — ABNORMAL LOW (ref 8.9–10.3)
Chloride: 105 mmol/L (ref 98–111)
Creatinine, Ser: 0.86 mg/dL (ref 0.61–1.24)
GFR, Estimated: >60 mL/min (ref >60)
Glucose, Bld: 138 mg/dL — ABNORMAL HIGH (ref 70–99)
Potassium: 4.6 mmol/L (ref 3.5–5.1)
Sodium: 140 mmol/L (ref 135–145)

## 2024-05-20 LAB — MAGNESIUM: Magnesium: 2.3 mg/dL (ref 1.7–2.4)

## 2024-05-20 MED ORDER — DIPHENHYDRAMINE HCL 50 MG/ML IJ SOLN: 12.5000 mg | Freq: Four times a day (QID) | INTRAMUSCULAR | Status: DC | PRN

## 2024-05-20 MED ORDER — DIPHENHYDRAMINE HCL 12.5 MG/5ML PO ELIX: 12.5000 mg | ORAL_SOLUTION | Freq: Four times a day (QID) | ORAL | Status: DC | PRN

## 2024-05-20 MED ORDER — HYDROMORPHONE 1 MG/ML IV SOLN: INTRAVENOUS | Status: DC

## 2024-05-20 MED ORDER — SODIUM CHLORIDE 0.9% FLUSH: 9.0000 mL | INTRAVENOUS | Status: DC | PRN

## 2024-05-20 MED ORDER — NALOXONE HCL 0.4 MG/ML IJ SOLN: 0.4000 mg | INTRAMUSCULAR | Status: DC | PRN

## 2024-05-20 MED ORDER — LACTATED RINGERS IV SOLN: INTRAVENOUS | Status: DC

## 2024-05-20 MED ORDER — HYDROMORPHONE HCL 1 MG/ML IJ SOLN
1.0000 mg | INTRAMUSCULAR | Status: DC | PRN
  Administered 2024-05-20 – 2024-05-22 (×11): 1 mg via INTRAVENOUS
  Filled 2024-05-20 (×11): qty 1

## 2024-05-20 MED ORDER — OXYCODONE HCL 5 MG PO TABS
5.0000 mg | ORAL_TABLET | ORAL | Status: DC | PRN
  Administered 2024-05-20 – 2024-05-23 (×10): 5 mg via ORAL
  Filled 2024-05-20 (×10): qty 1

## 2024-05-20 MED ORDER — METHOCARBAMOL 1000 MG/10ML IJ SOLN
500.0000 mg | Freq: Four times a day (QID) | INTRAMUSCULAR | Status: DC | PRN
  Administered 2024-05-20 – 2024-05-21 (×4): 500 mg via INTRAVENOUS
  Filled 2024-05-20 (×4): qty 5

## 2024-05-20 NOTE — Progress Notes (Addendum)
 Patient now refusing IV fluids and PCA pump. Provider made aware.

## 2024-05-20 NOTE — Progress Notes (Signed)
  Progress Note   Patient: Marcus [name] FMW:[id number] DOB: [birth year] DOA: 05/18/2024     2 DOS: the patient was seen and examined on 05/20/2024   Brief hospital course: ARLESS VINEYARD is a 58 y.o. 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  CT scan showed perforated sigmoid diverticulosis. Exploratory laparotomy with Hetty procedure was performed 10/31.    Principal Problem:   Diverticulitis Active Problems:   CAD in native artery   Diverticulitis of large intestine with perforation and abscess without bleeding   PAF (paroxysmal atrial fibrillation) (HCC)   Large bowel perforation (HCC)   Overweight (BMI 25.0-29.9)   Assessment and Plan: Nonsustained ventricular tachycardia. Telemetry showed 4 beats of nonsustained ventricular tachycardia earlier 11/1.  Reviewed echocardiogram performed 07/2023, showed ejection fraction 55 to 60% with grade 1 diastolic dysfunction, no significant valvular disease. Also checked electrolytes, patient has currently normal potassium magnesium .  Will follow closely.  Sigmoid diverticulitis with perforation. Acute peritonitis secondary to bowel perforation. Status post the surgery, continue antibiotics.  Continue pain medicine.  Continue IV fluids until patient bowel recovers.   Paroxysmal atrial fibrillation. Eliquis  on hold due to surgery and bleeding risk.    Coronary artery disease. Carotid stenosis status post stenting. Condition stable.   Alcohol abuse No evidence of alcohol withdrawal.  Continue CIWA protocol   COPD. No exacerbation.   Overweight with BMI 29.29.       Subjective:  Patient still complaining of significant pain, some nausea.  No bowel movement or passed gas.  Denies any shortness of  breath.  Physical Exam: Vitals:   05/19/24 2329 05/20/24 0443 05/20/24 0534 05/20/24 0801  BP: (!) 152/72 133/83  (!) 151/90  Pulse: 96 74  88  Resp: 18 18  14   Temp:   (!) 97.4 F (36.3 C) (!) 97.5 F (36.4 C)  TempSrc:   Axillary   SpO2: 96% 99%  96%  Weight:      Height:       General exam: Appears calm and comfortable  Respiratory system: Clear to auscultation. Respiratory effort normal. Cardiovascular system: S1 & S2 heard, RRR. No JVD, murmurs, rubs, gallops or clicks. No pedal edema. Gastrointestinal system: Abdomen is nondistended, soft and diffuse tender. No organomegaly or masses felt. No bowel sounds heard. Central nervous system: Alert and oriented. No focal neurological deficits. Extremities: Symmetric 5 x 5 power. Skin: No rashes, lesions or ulcers Psychiatry: Judgement and insight appear normal. Mood & affect appropriate.    Data Reviewed:  Reviewed her telemetry, reviewed echocardiogram results, reviewed lab results.  Family Communication: None  Disposition: Status is: Inpatient Remains inpatient appropriate because: Severity of disease, IV treatment, postop.     Time spent: 50 minutes  Author: Murvin Mana, MD 05/20/2024 11:17 AM  For on call review [email or web address].

## 2024-05-20 NOTE — Plan of Care (Signed)

## 2024-05-20 NOTE — Plan of Care (Signed)

## 2024-05-20 NOTE — Progress Notes (Signed)
 Patient had increased abdominal pain this afternoon,.  Become more distended. Upon my examination, patient is still alert and oriented x 3, lungs are clear.  Heart tachycardic, no murmurs.  Abdomen: Firm, tender, severe rebound tenderness.  No bowel sounds. Discussed with Dr. Marinda, he does not believe a CT scan will add any additional information.  I have ordered dilaudid  pca. he will evaluate the patient again.

## 2024-05-20 NOTE — Progress Notes (Signed)
 CC: Perforated Diverticulitis Subjective: Patient is postop day 1 from perforated diverticulitis requiring Hartman's procedure.  He reports doing okay.  He says that he is having a significant amount of pain.  He denies any vomiting but does say he feels nauseated.  He reports that he feels like there is been flatus out of his stoma.  He reports also feeling a little bit distended.  Patient also relates that he has had urologic problems previously.  He does report that he was told by a urologist that eventually his urethra would need reconstruction.  It was difficult to place Foley yesterday and they were noted to be penile nodules and a possible stricture.  Objective: Vital signs in last 24 hours: Temp:  [97.4 F (36.3 C)-99.1 F (37.3 C)] 97.5 F (36.4 C) (11/01 0801) Pulse Rate:  [74-123] 88 (11/01 0801) Resp:  [12-20] 14 (11/01 0801) BP: (105-178)/(43-161) 151/90 (11/01 0801) SpO2:  [93 %-100 %] 96 % (11/01 0801) Last BM Date : 05/17/24  Intake/Output from previous day: 10/31 0701 - 11/01 0700 In: 2700 [I.V.:2400; IV Piggyback:300] Out: 1400 [Urine:1200; Drains:150; Blood:50] Intake/Output this shift: No intake/output data recorded.  Physical exam:  Abdomen is soft, slightly distended, ostomy hyperemic but certainly viable, bowel sweat within his ostomy bag with out any major air, midline wound with dressing over it with a little bit of spotting on the dressing inferiorly.  Lab Results: CBC  Recent Labs    05/19/24 0419 05/20/24 0824  WBC 12.0* 15.0*  HGB 12.5* 13.3  HCT 37.1* 40.1  PLT 293 287   BMET Recent Labs    05/19/24 0419 05/20/24 0824  NA 138 140  K 3.6 4.6  CL 104 105  CO2 25 22  GLUCOSE 75 138*  BUN 13 18  CREATININE 0.95 0.86  CALCIUM  8.1* 8.5*   PT/INR No results for input(s): LABPROT, INR in the last 72 hours. ABG No results for input(s): PHART, HCO3 in the last 72 hours.  Invalid input(s): PCO2, PO2  Studies/Results: No  results found.  Anti-infectives: Anti-infectives (From admission, onward)    Start     Dose/Rate Route Frequency Ordered Stop   05/18/24 2230  piperacillin-tazobactam (ZOSYN) IVPB 3.375 g        3.375 g 12.5 mL/hr over 240 Minutes Intravenous Every 8 hours 05/18/24 1441     05/18/24 1430  piperacillin-tazobactam (ZOSYN) IVPB 3.375 g        3.375 g 100 mL/hr over 30 Minutes Intravenous  Once 05/18/24 1416 05/18/24 1524       Assessment/Plan:  Patient is postop day 1 from Hartman's procedure.  Overall he is doing as expected.  A little bit of nausea and slightly bloated but he does report flatus from the ostomy.  At this time I think it is okay to give him sips of clear liquids.  He can have juice and water.  Would h hold off on advancing at this time.  Continue as needed pain control.  He needs to get up and move around.  Continue I-S and pulmonary toilet.  Recommend at least 4 days of antibiotics.  Please continue to hold his Eliquis  for now.  Jayson Endow, M.D. [facility]

## 2024-05-21 DIAGNOSIS — I480 Paroxysmal atrial fibrillation: Secondary | ICD-10-CM | POA: Diagnosis not present

## 2024-05-21 DIAGNOSIS — K5720 Diverticulitis of large intestine with perforation and abscess without bleeding: Secondary | ICD-10-CM | POA: Diagnosis not present

## 2024-05-21 DIAGNOSIS — I4729 Other ventricular tachycardia: Secondary | ICD-10-CM | POA: Diagnosis not present

## 2024-05-21 LAB — BASIC METABOLIC PANEL WITH GFR
Anion gap: 12 (ref 5–15)
BUN: 16 mg/dL (ref 6–20)
CO2: 25 mmol/L (ref 22–32)
Calcium: 8.7 mg/dL — ABNORMAL LOW (ref 8.9–10.3)
Chloride: 103 mmol/L (ref 98–111)
Creatinine, Ser: 0.88 mg/dL (ref 0.61–1.24)
GFR, Estimated: >60 mL/min (ref >60)
Glucose, Bld: 106 mg/dL — ABNORMAL HIGH (ref 70–99)
Potassium: 4 mmol/L (ref 3.5–5.1)
Sodium: 140 mmol/L (ref 135–145)

## 2024-05-21 LAB — CBC
HCT: 38.3 % — ABNORMAL LOW (ref 39.0–52.0)
Hemoglobin: 12.6 g/dL — ABNORMAL LOW (ref 13.0–17.0)
MCH: 31.8 pg (ref 26.0–34.0)
MCHC: 32.9 g/dL (ref 30.0–36.0)
MCV: 96.7 fL (ref 80.0–100.0)
Platelets: 332 K/uL (ref 150–400)
RBC: 3.96 MIL/uL — ABNORMAL LOW (ref 4.22–5.81)
RDW: 11.8 % (ref 11.5–15.5)
WBC: 11.8 K/uL — ABNORMAL HIGH (ref 4.0–10.5)
nRBC: 0 % (ref 0.0–0.2)

## 2024-05-21 LAB — MAGNESIUM: Magnesium: 2.3 mg/dL (ref 1.7–2.4)

## 2024-05-21 NOTE — TOC Initial Note (Signed)
 Transition of Care [facility]) - Initial/Assessment Note    Patient Details  Name: Marcus [name] MRN: [id number] Date of Birth: 1965-11-27  [facility]) CM/SW Contact:    Nyasia Baxley L Sultana Tierney, LCSW Phone Number: 05/21/2024, 10:31 AM  Clinical Narrative:                  [facility] consult received for substance abuse counseling/education. TOC does not provide counseling/education. Resources have been uploaded to the AVS.        Patient Goals and CMS Choice            Expected Discharge Plan and Services                                              Prior Living Arrangements/Services                       Activities of Daily Living   ADL Screening (condition at time of admission) Independently performs ADLs?: Yes (appropriate for developmental age) Is the patient deaf or have difficulty hearing?: No Does the patient have difficulty seeing, even when wearing glasses/contacts?: No Does the patient have difficulty concentrating, remembering, or making decisions?: No  Permission Sought/Granted                  Emotional Assessment              Admission diagnosis:  Diverticulitis [K57.92] Diverticulitis of large intestine with abscess without bleeding [K57.20] Patient Active Problem List   Diagnosis Date Noted   Overweight (BMI 25.0-29.9) 05/20/2024   NSVT (nonsustained ventricular tachycardia) (HCC) 05/20/2024   PAF (paroxysmal atrial fibrillation) (HCC) 05/19/2024   Large bowel perforation (HCC) 05/19/2024   Diverticulitis of large intestine with perforation and abscess without bleeding 05/18/2024   Diverticulitis 05/18/2024   Carotid stenosis, left 01/03/2024   Aortic atherosclerosis 12/17/2023   Tobacco abuse    Atrial fibrillation with rapid ventricular response (HCC) 08/21/2023   Demand ischemia (HCC) 08/21/2023   HFrEF (heart failure with reduced ejection fraction) (HCC) 08/19/2023   Chest pain 08/19/2023   Carotid stenosis,  right 08/31/2022   Coronary artery disease 08/27/2022   Hyperlipidemia 08/27/2022   Unstable angina (HCC) 08/26/2022   Carotid stenosis 08/18/2022   Substance induced mood disorder (HCC) 10/19/2016   Cocaine abuse (HCC) 10/19/2016   Dysthymia 10/19/2016   Hypertension    CAD in native artery    Ischemic cardiomyopathy    Polysubstance abuse (HCC)    Depression    Hyperglycemia    PCP:  Pcp, No Pharmacy:   [facility] [address], [place] - [address] [place] [address] Phone: [phone] Fax: [phone]     Social Drivers of Health (SDOH) Social History: SDOH Screenings   Food Insecurity: No Food Insecurity (05/18/2024)  Housing: Low Risk  (05/18/2024)  Transportation Needs: No Transportation Needs (05/18/2024)  Utilities: Not At Risk (05/18/2024)  Tobacco Use: High Risk (05/18/2024)   SDOH Interventions:     Readmission Risk Interventions     No data to display

## 2024-05-21 NOTE — Plan of Care (Signed)

## 2024-05-21 NOTE — Progress Notes (Signed)
 CC: Perforated Diverticulitis Subjective: Patient is postop day 2 from perforated diverticulitis requiring Hartman's procedure.  Patient had some pain yesterday afternoon.  He was started on oral pain medication and says that this has helped him.  He says that he continues to have flatus out of his stoma.  He still feels a little bit nauseated and distended but the pain has improved.  Has not been getting out of bed very much.  Objective: Vital signs in last 24 hours: Temp:  [97.8 F (36.6 C)-98.7 F (37.1 C)] 98 F (36.7 C) (11/02 1002) Pulse Rate:  [91-102] 91 (11/02 0747) Resp:  [14-20] 16 (11/02 0747) BP: (119-165)/(74-102) 119/80 (11/02 1002) SpO2:  [94 %-100 %] 100 % (11/02 1002) Last BM Date : 05/17/24  Intake/Output from previous day: 11/01 0701 - 11/02 0700 In: 2019.4 [P.O.:390; I.V.:1330; IV Piggyback:299.4] Out: 1920 [Urine:1700; Drains:190; Stool:30] Intake/Output this shift: No intake/output data recorded.  Physical exam:  Abdomen is soft, slightly distended, ostomy appears healthy there are some air within his ostomy bag, midline wound with dressing over it with a little bit of spotting on the dressing inferiorly.  Lab Results: CBC  Recent Labs    05/20/24 0824 05/21/24 0615  WBC 15.0* 11.8*  HGB 13.3 12.6*  HCT 40.1 38.3*  PLT 287 332   BMET Recent Labs    05/20/24 0824 05/21/24 0615  NA 140 140  K 4.6 4.0  CL 105 103  CO2 22 25  GLUCOSE 138* 106*  BUN 18 16  CREATININE 0.86 0.88  CALCIUM  8.5* 8.7*   PT/INR No results for input(s): LABPROT, INR in the last 72 hours. ABG No results for input(s): PHART, HCO3 in the last 72 hours.  Invalid input(s): PCO2, PO2  Studies/Results: No results found.  Anti-infectives: Anti-infectives (From admission, onward)    Start     Dose/Rate Route Frequency Ordered Stop   05/18/24 2230  piperacillin-tazobactam (ZOSYN) IVPB 3.375 g        3.375 g 12.5 mL/hr over 240 Minutes Intravenous Every 8  hours 05/18/24 1441     05/18/24 1430  piperacillin-tazobactam (ZOSYN) IVPB 3.375 g        3.375 g 100 mL/hr over 30 Minutes Intravenous  Once 05/18/24 1416 05/18/24 1524       Assessment/Plan:  Patient is postop day 2 from Hartman's procedure.  Overall he is doing as expected.  He is having more air out of his ostomy.  His distention is about the same but says his pain is improved.  I think it is okay to start him on a clear liquid diet.  Recommend discontinuing IV fluids.  Continue on antibiotics for now.  I would remove the Foley tonight.  That way, if he is unable to urinate by the morning then there is a urologist that can replace it given the degree of difficulty in getting it in.  Continue to hold Eliquis .  Jayson Endow, M.D. [facility]

## 2024-05-21 NOTE — Evaluation (Signed)
 Physical Therapy Evaluation Patient Details Name: Marcus [name] MRN: [id number] DOB: 1966/03/01 Today's Date: 05/21/2024  History of Present Illness  Pt is a 58 y.o. male presenting with sx of intermittent cramping-like left lower abdominal pain with worsening of left lower quadrant abdominal pain and came to the hospital. Denies any fever chills no shortness of breath no chest pains. Pt Dx: Diverticulitus now s/p colostomy placement, JP drain (RLQ).   Clinical Impression  Pt was pleasant and motivated to participate during the session and put forth good effort throughout. Pt was found in bed on 2L of O2 at beginning of session with initial SpO2 reading at 100. PT educated pt on log rolling technique to get in and out the bed and adherence to abdominal precautions. Pt was able to move with minimal cueing for proper sequencing from supine to sit/EOB. Pt reports pain 10/10 NPS at the beginning of session, but tolerated movement well with little fatigue and SOB. Pts sx were monitored throughout as well as O2 at baseline, during and post session. Pt tolerated 185ft of ambulation with RW on RA and contact guard assist, as well as 40 ft x1 on RA with SpO2 steady around the 90s before, during and after. Pt was left in bed on RA. Pt will benefit from continued PT services upon discharge to safely address deficits listed in patient problem list for decreased caregiver assistance and eventual return to PLOF.       If plan is discharge home, recommend the following:     Can travel by private vehicle        Equipment Recommendations None recommended by PT  Recommendations for Other Services       Functional Status Assessment       Precautions / Restrictions Precautions Precautions: Other (comment) (abdominal precautions) Recall of Precautions/Restrictions: Intact Precaution/Restrictions Comments: Log roll technique, JP drain RLQ, colostomy LLQ, abdominal precautions Restrictions Weight Bearing  Restrictions Per Provider Order: No      Mobility  Bed Mobility Overal bed mobility: Independent             General bed mobility comments: Pt educated/instructed on log roll technique    Transfers Overall transfer level: Needs assistance Equipment used: Rolling walker (2 wheels), no AD Transfers: Sit to/from Stand Sit to Stand: Contact guard assist           General transfer comment: pt needed cueing hand placement, sitting EOB and standing with RW; good eccentric/concentric control and stability throughout.    Ambulation/Gait Ambulation/Gait assistance: Contact guard assist Gait Distance (Feet): 175 Feet x1 w/o AD, 54ft x1 w/RW Assistive device: Rolling walker (2 wheels) Gait Pattern/deviations: WFL(Within Functional Limits)   Gait velocity interpretation: minimally reduced   General Gait Details: pt ambulates well without RW, denies SOB and with dynamic movements per below.  Stairs            Wheelchair Mobility     Tilt Bed    Modified Rankin (Stroke Patients Only)       Balance Overall balance assessment: Needs assistance Sitting-balance support: No upper extremity supported Sitting balance-Leahy Scale: Good Sitting balance - Comments: pt denies any dizziness or SOB     Standing balance-Leahy Scale: Good Standing balance comment: pt denies dizziness and SOB upon standing.             High level balance activites: Sudden stops, Head turns High Level Balance Comments: Pt demonstrated modified DGI tasks: sudden stops, head turns (laterally and vertically) during 110ft  walk             Pertinent Vitals/Pain Pain Assessment Pain Assessment: 0-10 Pain Score: 10-Worst pain ever Pain Location: abdomen, LLQ Pain Descriptors / Indicators: Aching, Tender, Discomfort, Sore Pain Intervention(s): Limited activity within patient's tolerance, PCA encouraged, Monitored during session    Home Living Family/patient expects to be discharged  to:: Private residence Living Arrangements: Alone Available Help at Discharge: Neighbor Type of Home: House Home Access: Stairs to enter Entrance Stairs-Rails: Left Entrance Stairs-Number of Steps: 5   Home Layout: One level;Full bath on main level Home Equipment: Agricultural Consultant (2 wheels)      Prior Function Prior Level of Function : Independent/Modified Independent             Mobility Comments: pt ambulates without AD at home but has a RW if needed ADLs Comments: Pt is independent with ADLs     Extremity/Trunk Assessment   Upper Extremity Assessment Upper Extremity Assessment: Overall WFL for tasks assessed         Cervical / Trunk Assessment Cervical / Trunk Assessment: Normal  Communication   Communication Communication: No apparent difficulties    Cognition Arousal: Alert Behavior During Therapy: WFL for tasks assessed/performed   PT - Cognitive impairments: No apparent impairments                         Following commands: Intact       Cueing Cueing Techniques: Verbal cues, Tactile cues     General Comments General comments (skin integrity, edema, etc.): incision site looked good without edema    Exercises Other Exercises Other Exercises: Pt tolerated graded exrecises (sitting, marching, laps around the room on RA)    Assessment/Plan    PT Assessment Patient needs continued PT services  PT Problem List Decreased strength;Pain;Decreased mobility;Decreased activity tolerance       PT Treatment Interventions Gait training;Balance training;Therapeutic activities;Therapeutic exercise;Functional mobility training;Stair training;DME instruction;Patient/family education    PT Goals (Current goals can be found in the Care Plan section)  Acute Rehab PT Goals Patient Stated Goal: Getting back to cooking and walking his dogs PT Goal Formulation: With patient Time For Goal Achievement: 06/03/24 Potential to Achieve Goals: Good     Frequency Min 1X/week     Co-evaluation               AM-PAC PT 6 Clicks Mobility  Outcome Measure Help needed turning from your back to your side while in a flat bed without using bedrails?: A Little Help needed moving from lying on your back to sitting on the side of a flat bed without using bedrails?: A Little Help needed moving to and from a bed to a chair (including a wheelchair)?: A Little Help needed standing up from a chair using your arms (e.g., wheelchair or bedside chair)?: A Little Help needed to walk in hospital room?: A Little Help needed climbing 3-5 steps with a railing? : A Little 6 Click Score: 18    End of Session Equipment Utilized During Treatment: Gait belt Activity Tolerance: Patient tolerated treatment well;Patient limited by fatigue;No increased pain Patient left: in bed;with call bell/phone within reach;with bed alarm set; pt declined OOB into chair Nurse Communication: Mobility status PT Visit Diagnosis: Difficulty in walking, not elsewhere classified (R26.2);History of falling (Z91.81)    Time: 8661-8579 PT Time Calculation (min) (ACUTE ONLY): 42 min   Charges:  Corean Newport, SPT 05/21/24, 3:46 PM

## 2024-05-21 NOTE — Progress Notes (Addendum)
  Progress Note   Patient: Marcus [name] FMW:[id number] DOB: [birth year] DOA: 05/18/2024     3 DOS: the patient was seen and examined on 05/21/2024   Brief hospital course: FREMON ZACHARIA is a 58 y.o. 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  CT scan showed perforated sigmoid diverticulosis. Exploratory laparotomy with Hetty procedure was performed 10/31.    Principal Problem:   Diverticulitis Active Problems:   CAD in native artery   Diverticulitis of large intestine with perforation and abscess without bleeding   PAF (paroxysmal atrial fibrillation) (HCC)   Large bowel perforation (HCC)   Overweight (BMI 25.0-29.9)   NSVT (nonsustained ventricular tachycardia) (HCC)   Assessment and Plan: Nonsustained ventricular tachycardia. Telemetry showed 4 beats of nonsustained ventricular tachycardia earlier 11/1.  Reviewed echocardiogram performed 07/2023, showed ejection fraction 55 to 60% with grade 1 diastolic dysfunction, no significant valvular disease. Did not have recurrence, electrolytes still normal today.  No need for further workup.   Sigmoid diverticulitis with perforation. Acute peritonitis secondary to bowel perforation. Status post the surgery, continue antibiotics.  Continue pain medicine.   Patient had an increased abdominal pain yesterday, required more pain medicine.  However, he has passed a large amount of gas overnight, abdominal pain much improved.  Leukocytosis much better.  Surgery has started clear liquid diet, will reduce IV fluid infusion rate.  Urinary retention Patient had urine retention, Foley catheter was anchored by urology during surgery.  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  Per urology recommendation, we will try to remove Foley  catheter tonight.  Bladder scan tomorrow morning.  Paroxysmal atrial fibrillation. Eliquis  on hold due to surgery and bleeding risk.     Coronary artery disease. Carotid stenosis status post stenting. Condition stable.   Alcohol abuse No evidence of alcohol withdrawal.  Continue CIWA protocol   COPD. No exacerbation.   Overweight with BMI 29.29.      Subjective:  Had significant abdominal pain yesterday, bloating, and cramping.  Since then, patient has passed large amount of gas, abdominal pain much better today.  Physical Exam: Vitals:   05/20/24 2347 05/21/24 0433 05/21/24 0747 05/21/24 1002  BP: (!) 146/80 (!) 145/74 (!) 156/91 119/80  Pulse: (!) 102 94 91   Resp: 20 20 16    Temp: 98 F (36.7 C) 98.3 F (36.8 C) 97.8 F (36.6 C) 98 F (36.7 C)  TempSrc: Oral Rectal Oral   SpO2: 100% 100% 99% 100%  Weight:      Height:       General exam: Appears calm and comfortable  Respiratory system: Clear to auscultation. Respiratory effort normal. Cardiovascular system: S1 & S2 heard, RRR. No JVD, murmurs, rubs, gallops or clicks. No pedal edema. Gastrointestinal system: Abdomen is nondistended, soft and tender. No organomegaly or masses felt. Normal bowel sounds heard. Central nervous system: Alert and oriented. No focal neurological deficits. Extremities: Symmetric 5 x 5 power. Skin: No rashes, lesions or ulcers Psychiatry: Judgement and insight appear normal. Mood & affect appropriate.    Data Reviewed:  Lab results reviewed.  Family Communication: None  Disposition: Status is: Inpatient Remains inpatient appropriate because: Severity of disease, IV treatment, inpatient procedure.     Time spent: 35 minutes  Author: Murvin Mana, MD 05/21/2024 10:14 AM  For on call review [email or web address].

## 2024-05-21 NOTE — Progress Notes (Signed)
 Patient given an Incentive spirometer. Education done.

## 2024-05-21 NOTE — Plan of Care (Signed)

## 2024-05-21 NOTE — Discharge Instructions (Signed)
 Outpatient Substance Use Treatment Services   [facility] [address] [place], [place] [address]  [phone] Private insurance, Medicare A&B, and [facility]   [facility]  [address]  [place], [place] [address] [phone] Medicaid, Self Pay   [facility]      [address]  [place], [place] [phone] Medicaid and [facility], Self Pay   [facility] [address]  [place], [place]  [phone] [facility], and Self Pay  [facility]      [address]    [place], [place] [address]  [phone] or [phone] Private Insurance Only                 [facility] [address]  [place], [place]  [phone] Medicaid, Medicare, Private Insurance  [facility] [address]  [place], [place] [address] [phone] Services are free or reduced  [facility]  [address] [phone]  Self Pay only, sliding scale  [facility]  [address]  [place], [place] [address] [phone] ([facility]) Self Pay, Medicaid Only   [facility] [address], [place] [address] [phone] Medicaid, Medicare, Private Insurance

## 2024-05-22 ENCOUNTER — Encounter: Payer: Self-pay | Admitting: Surgery

## 2024-05-22 DIAGNOSIS — I4729 Other ventricular tachycardia: Secondary | ICD-10-CM | POA: Diagnosis not present

## 2024-05-22 DIAGNOSIS — I480 Paroxysmal atrial fibrillation: Secondary | ICD-10-CM | POA: Diagnosis not present

## 2024-05-22 DIAGNOSIS — K5720 Diverticulitis of large intestine with perforation and abscess without bleeding: Secondary | ICD-10-CM | POA: Diagnosis not present

## 2024-05-22 LAB — BASIC METABOLIC PANEL WITH GFR
Anion gap: 7 (ref 5–15)
BUN: 11 mg/dL (ref 6–20)
CO2: 29 mmol/L (ref 22–32)
Calcium: 8.6 mg/dL — ABNORMAL LOW (ref 8.9–10.3)
Chloride: 99 mmol/L (ref 98–111)
Creatinine, Ser: 0.9 mg/dL (ref 0.61–1.24)
GFR, Estimated: >60 mL/min (ref >60)
Glucose, Bld: 106 mg/dL — ABNORMAL HIGH (ref 70–99)
Potassium: 3.9 mmol/L (ref 3.5–5.1)
Sodium: 135 mmol/L (ref 135–145)

## 2024-05-22 LAB — MAGNESIUM: Magnesium: 2.1 mg/dL (ref 1.7–2.4)

## 2024-05-22 MED ORDER — HYDROMORPHONE HCL 1 MG/ML IJ SOLN
1.0000 mg | Freq: Four times a day (QID) | INTRAMUSCULAR | Status: DC | PRN
  Administered 2024-05-22 – 2024-05-23 (×2): 1 mg via INTRAVENOUS
  Filled 2024-05-22 (×2): qty 1

## 2024-05-22 NOTE — Progress Notes (Addendum)
  Progress Note   Patient: Marcus [name] FMW:[id number] DOB: 11-18-1965 DOA: 05/18/2024     4 DOS: the patient was seen and examined on 05/22/2024   Brief hospital course: Marcus [name] is a 58 y.o. 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  CT scan showed perforated sigmoid diverticulosis. Exploratory laparotomy with Hetty procedure was performed 10/31.    Principal Problem:   Diverticulitis Active Problems:   CAD in native artery   Diverticulitis of large intestine with perforation and abscess without bleeding   PAF (paroxysmal atrial fibrillation) (HCC)   Large bowel perforation (HCC)   Overweight (BMI 25.0-29.9)   NSVT (nonsustained ventricular tachycardia) (HCC)   Assessment and Plan:  Nonsustained ventricular tachycardia. Telemetry showed 4 beats of nonsustained ventricular tachycardia earlier 11/1.  Reviewed echocardiogram performed 07/2023, showed ejection fraction 55 to 60% with grade 1 diastolic dysfunction, no significant valvular disease. Did not have recurrence, electrolytes still normal today.  No need for further workup.   Sigmoid diverticulitis with perforation. Acute peritonitis secondary to bowel perforation. Status post the surgery, continue antibiotics.  Continue pain medicine.   Patient had an increased abdominal pain yesterday, required more pain medicine.  However, he has passed a large amount of gas overnight, abdominal pain much improved.   Condition continues to improve, tolerating liquid diet.  However, intake is still adequate, will continue low-dose IV fluids.  Continue IV antibiotics.   Urinary retention Patient had urine retention, Foley catheter was anchored by urology during surgery.  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  Per  urology recommendation, Foley catheter was removed.  Patient appeared to be able to urinate.   Paroxysmal atrial fibrillation. Discussed with general surgery, plan to start Eliquis  tomorrow.     Coronary artery disease. Carotid stenosis status post stenting. Condition stable.   Alcohol abuse No evidence of alcohol withdrawal.  Continue CIWA protocol   COPD. No exacerbation.   Overweight with BMI 29.29.      Subjective:  Patient is doing better, tolerating liquid diet, small amount of stool in ostomy bag.  Physical Exam: Vitals:   05/21/24 1441 05/21/24 1654 05/21/24 2022 05/22/24 0425  BP:  (!) 167/85 (!) 158/78 (!) 142/84  Pulse:  87 82 81  Resp:  16 16 16   Temp:  97.9 F (36.6 C) 98.6 F (37 C) 98.4 F (36.9 C)  TempSrc:  Oral    SpO2: 100% 100% 95% 95%  Weight:      Height:       General exam: Appears calm and comfortable  Respiratory system: Clear to auscultation. Respiratory effort normal. Cardiovascular system: S1 & S2 heard, RRR. No JVD, murmurs, rubs, gallops or clicks. No pedal edema. Gastrointestinal system: Abdomen is nondistended, soft and nontender. No organomegaly or masses felt. Normal bowel sounds heard. Central nervous system: Alert and oriented. No focal neurological deficits. Extremities: Symmetric 5 x 5 power. Skin: No rashes, lesions or ulcers Psychiatry: Judgement and insight appear normal. Mood & affect appropriate.    Data Reviewed:  Lab results reviewed  Family Communication: None  Disposition: Status is: Inpatient Remains inpatient appropriate because: Severity of disease, IV treatment.     Time spent: 35 minutes  Author: Murvin Mana, MD 05/22/2024 12:27 PM  For on call review [email or web address].

## 2024-05-22 NOTE — Progress Notes (Signed)
 [facility] SURGICAL PROGRESS NOTE  Hospital Day(s): 4.   Post op day(s): 3 Days Post-Op.   Interval History:  Patient seen and examined No acute events or new complaints overnight.  Patient reports he is doing well Abdominal pain is improved No fever, chills, nausea, emesis Renal function remains normal; sCr - 0.90; UO - 1950 ccs (he has voided since catheter removal) No electrolyte derangements Surgical drain with 450 ccs: serous CLD; no issues - asking for more He is having gas via colostomy   Vital signs in last 24 hours: [min-max] current  Temp:  [97.9 F (36.6 C)-98.6 F (37 C)] 98.4 F (36.9 C) (11/03 0425) Pulse Rate:  [81-89] 81 (11/03 0425) Resp:  [16] 16 (11/03 0425) BP: (119-167)/(78-85) 142/84 (11/03 0425) SpO2:  [95 %-100 %] 95 % (11/03 0425)     Height: 5' 3 (160 cm) Weight: 75 kg BMI (Calculated): 29.3   Intake/Output last 2 shifts:  11/02 0701 - 11/03 0700 In: 1692.5 [P.O.:480; I.V.:1103.3; IV Piggyback:109.2] Out: 2420 [Urine:1950; Drains:450; Stool:20]   Physical Exam:  Constitutional: alert, cooperative and no distress  Respiratory: breathing non-labored at rest  Cardiovascular: regular rate and sinus rhythm  Gastrointestinal: soft, incisional soreness improving, and non-distended, no rebound/guarding. Surgical drain in right abdomen; output serous. Colostomy in left abdomen; gas in bag Integumentary: Laparotomy is CDI with staples; no erythema   Labs:     Latest Ref Rng & Units 05/21/2024    6:15 AM 05/20/2024    8:24 AM 05/19/2024    4:19 AM  CBC  WBC 4.0 - 10.5 K/uL 11.8  15.0  12.0   Hemoglobin 13.0 - 17.0 g/dL 87.3  86.6  87.4   Hematocrit 39.0 - 52.0 % 38.3  40.1  37.1   Platelets 150 - 400 K/uL 332  287  293       Latest Ref Rng & Units 05/22/2024    4:33 AM 05/21/2024    6:15 AM 05/20/2024    8:24 AM  CMP  Glucose 70 - 99 mg/dL 893  893  861   BUN 6 - 20 mg/dL 11  16  18    Creatinine 0.61 - 1.24 mg/dL 9.09  9.11   9.13   Sodium 135 - 145 mmol/L 135  140  140   Potassium 3.5 - 5.1 mmol/L 3.9  4.0  4.6   Chloride 98 - 111 mmol/L 99  103  105   CO2 22 - 32 mmol/L 29  25  22    Calcium  8.9 - 10.3 mg/dL 8.6  8.7  8.5     Imaging studies: No new pertinent imaging studies   Assessment/Plan: 58 y.o. male 3 Days Post-Op s/p Hartman's Procedure for perforated diverticulitis    - Advance to FLD this AM, Can consider soft diet this evening vs tomorrow morning  - Continue IV Abx (Zosyn)  - Continue surgical drain; monitor and record output daily - Monitor abdominal examination - WOC RN on board for ostomy teaching; Appreciate assistance   - Pain control prn; antiemetics prn   - Mobilize; therapies on board - Further management per primary service; we will follow   All of the above findings and recommendations were discussed with the patient, and the medical team, and all of patient's questions were answered to her expressed satisfaction.  -- Arthea Platt, PA-C [facility] 05/22/2024, 9:41 AM M-F: 7am - 4pm

## 2024-05-22 NOTE — Plan of Care (Signed)
  Problem: Education: Goal: Knowledge of General Education information will improve Description: Including pain rating scale, medication(s)/side effects and non-pharmacologic comfort measures 05/22/2024 0600 by Gretta Dagoberto HERO, RN Outcome: Progressing 05/22/2024 0559 by Gretta Dagoberto HERO, RN Outcome: Progressing   Problem: Health Behavior/Discharge Planning: Goal: Ability to manage health-related needs will improve 05/22/2024 0600 by Gretta Dagoberto HERO, RN Outcome: Progressing 05/22/2024 0559 by Gretta Dagoberto HERO, RN Outcome: Progressing   Problem: Clinical Measurements: Goal: Ability to maintain clinical measurements within normal limits will improve 05/22/2024 0600 by Gretta Dagoberto HERO, RN Outcome: Progressing 05/22/2024 0559 by Gretta Dagoberto HERO, RN Outcome: Progressing Goal: Will remain free from infection 05/22/2024 0600 by Gretta Dagoberto HERO, RN Outcome: Progressing 05/22/2024 0559 by Gretta Dagoberto HERO, RN Outcome: Progressing Goal: Diagnostic test results will improve 05/22/2024 0600 by Gretta Dagoberto HERO, RN Outcome: Progressing 05/22/2024 0559 by Gretta Dagoberto HERO, RN Outcome: Progressing Goal: Respiratory complications will improve 05/22/2024 0600 by Gretta Dagoberto HERO, RN Outcome: Progressing 05/22/2024 0559 by Gretta Dagoberto HERO, RN Outcome: Progressing Goal: Cardiovascular complication will be avoided 05/22/2024 0600 by Gretta Dagoberto HERO, RN Outcome: Progressing 05/22/2024 0559 by Gretta Dagoberto HERO, RN Outcome: Progressing   Problem: Activity: Goal: Risk for activity intolerance will decrease 05/22/2024 0600 by Gretta Dagoberto HERO, RN Outcome: Progressing 05/22/2024 0559 by Gretta Dagoberto HERO, RN Outcome: Progressing   Problem: Nutrition: Goal: Adequate nutrition will be maintained 05/22/2024 0600 by Gretta Dagoberto HERO, RN Outcome: Progressing 05/22/2024 0559 by Gretta Dagoberto HERO, RN Outcome: Progressing   Problem: Coping: Goal: Level of anxiety will decrease 05/22/2024 0600 by Gretta Dagoberto HERO, RN Outcome: Progressing 05/22/2024 0559 by Gretta Dagoberto HERO, RN Outcome: Progressing   Problem: Elimination: Goal: Will not experience complications related to bowel motility 05/22/2024 0600 by Gretta Dagoberto HERO, RN Outcome: Progressing 05/22/2024 0559 by Gretta Dagoberto HERO, RN Outcome: Progressing Goal: Will not experience complications related to urinary retention 05/22/2024 0600 by Gretta Dagoberto HERO, RN Outcome: Progressing 05/22/2024 0559 by Gretta Dagoberto HERO, RN Outcome: Progressing   Problem: Pain Managment: Goal: General experience of comfort will improve and/or be controlled 05/22/2024 0600 by Gretta Dagoberto HERO, RN Outcome: Progressing 05/22/2024 0559 by Gretta Dagoberto HERO, RN Outcome: Progressing   Problem: Safety: Goal: Ability to remain free from injury will improve 05/22/2024 0600 by Gretta Dagoberto HERO, RN Outcome: Progressing 05/22/2024 0559 by Gretta Dagoberto HERO, RN Outcome: Progressing   Problem: Skin Integrity: Goal: Risk for impaired skin integrity will decrease 05/22/2024 0600 by Gretta Dagoberto HERO, RN Outcome: Progressing 05/22/2024 0559 by Gretta Dagoberto HERO, RN Outcome: Progressing

## 2024-05-22 NOTE — Progress Notes (Signed)
 Mobility Specialist - Progress Note  Post-mobility: HR 92, SPO2 95%   05/22/24 0955  Mobility  Activity Ambulated independently  Level of Assistance Modified independent, requires aide device or extra time  Assistive Device  (pushing IV pole)  Distance Ambulated (ft) 320 ft  Activity Response Tolerated well  Mobility visit 1 Mobility  Mobility Specialist Start Time (ACUTE ONLY) 0932  Mobility Specialist Stop Time (ACUTE ONLY) [id number]  Mobility Specialist Time Calculation (min) (ACUTE ONLY) 20 min   Pt supine upon entry, utilizing RA. Pt amb two laps around the NS ModI, tolerated well--- endorsed min SOB and dizziness during amb. Pt required two standing rest breaks d/t dizziness. Pt returned to the room, left seated in the recliner with alarm set and needs within reach.  America Silvan Mobility Specialist 05/22/24 10:06 AM

## 2024-05-22 NOTE — TOC Initial Note (Signed)
 Transition of Care [facility]) - Initial/Assessment Note    Patient Details  Name: Marcus [name] MRN: [id number] Date of Birth: [birth year]  [facility] CM/SW Contact:    Corean ONEIDA Haddock, RN Phone Number: 05/22/2024, 2:52 PM  Clinical Narrative:                      Admitted for: 3 Days Post-Op s/p Hartman's Procedure for perforated diverticulitis  Admitted from: home Alone PCP: No PCP.  Patient declines for new referral for PCP to be made  Current home health/prior home health/DME: Discussed option of [facility] RN for new ostomy.  Patient adamantly declines.  He states I have a person that lives next door who is pretty much a nurse that is going to help me Patient states that his friend Koren will be transport at discharge     Patient Goals and CMS Choice            Expected Discharge Plan and Services                                              Prior Living Arrangements/Services                       Activities of Daily Living   ADL Screening (condition at time of admission) Independently performs ADLs?: Yes (appropriate for developmental age) Is the patient deaf or have difficulty hearing?: No Does the patient have difficulty seeing, even when wearing glasses/contacts?: No Does the patient have difficulty concentrating, remembering, or making decisions?: No  Permission Sought/Granted                  Emotional Assessment              Admission diagnosis:  Diverticulitis [K57.92] Diverticulitis of large intestine with abscess without bleeding [K57.20] Patient Active Problem List   Diagnosis Date Noted   Overweight (BMI 25.0-29.9) 05/20/2024   NSVT (nonsustained ventricular tachycardia) (HCC) 05/20/2024   PAF (paroxysmal atrial fibrillation) (HCC) 05/19/2024   Large bowel perforation (HCC) 05/19/2024   Diverticulitis of large intestine with perforation and abscess without bleeding 05/18/2024   Diverticulitis 05/18/2024    Carotid stenosis, left 01/03/2024   Aortic atherosclerosis 12/17/2023   Tobacco abuse    Atrial fibrillation with rapid ventricular response (HCC) 08/21/2023   Demand ischemia (HCC) 08/21/2023   HFrEF (heart failure with reduced ejection fraction) (HCC) 08/19/2023   Chest pain 08/19/2023   Carotid stenosis, right 08/31/2022   Coronary artery disease 08/27/2022   Hyperlipidemia 08/27/2022   Unstable angina (HCC) 08/26/2022   Carotid stenosis 08/18/2022   Substance induced mood disorder (HCC) 10/19/2016   Cocaine abuse (HCC) 10/19/2016   Dysthymia 10/19/2016   Hypertension    CAD in native artery    Ischemic cardiomyopathy    Polysubstance abuse (HCC)    Depression    Hyperglycemia    PCP:  Pcp, No Pharmacy:   [facility] [address] [place] - [address] [place] [address] Phone: [phone] Fax: [phone]     Social Drivers of Health (SDOH) Social History: SDOH Screenings   Food Insecurity: No Food Insecurity (05/18/2024)  Housing: Low Risk  (05/18/2024)  Transportation Needs: No Transportation Needs (05/18/2024)  Utilities: Not At Risk (05/18/2024)  Tobacco Use: High Risk (05/18/2024)  SDOH Interventions:     Readmission Risk Interventions    05/22/2024    2:52 PM  Readmission Risk Prevention Plan  Transportation Screening Complete  HRI or Home Care Consult Patient refused  Palliative Care Screening Not Applicable  Medication Review (RN Care Manager) Complete

## 2024-05-22 NOTE — Progress Notes (Addendum)
 Pt's bed alarm sounding.  Upon entering room, pt irate while ambulating to bathroom and stating that he called over an hour ago asking for help.  Per diplomatic services operational officer, pt had not mentioned need to go to bathroom but simply stated he was waiting for his Toradol that was originally refused which this RN was aware of but was working on discharge for another pt.  This RN requested of patient to not be disrespectful which made pt more irate.    1418 While in pt room, this RN requested clarification from pt in regards to 1800 dose of Toradol in which pt stated he didn't want any more pain medications, only Tylenol .  This RN repeated pt's request in which he again agreed.

## 2024-05-22 NOTE — Consult Note (Addendum)
 WOC Nurse ostomy follow up Pt had colostomy surgery performed 10/31. He states he does not need any friends or family present for a  teaching session.  Stoma is red and viable, above skin level and edematous, 2 inches and weeping.   There were clear fluid filled blisters to the peristomal edge fron 2:00 o'clock to 5:00 o'clock which have ruptured and evolved into pink moist partial thickness skin loss.  Applied silicone foam dressing to protect from further injury before applying the pouch.  No stool, small amt pink liquid in the pouch.  Pt states he has been passing gas.  Pt assisted with the pouch change using a hand held mirror.  He was able to stretch and apply the barrier ring and snap together the 2 piece pouching system.  He was able to open and close the velcro to empty.  Reviewed pouching routines and ordering supplies.  Educational materials and 5 sets of each supply left at the bedside for staff nurses' use: Use Supplies: barrier ring, Lawson # [phone], wafer Gerlean # 2, pouch Lawson # [id number]. Foam dressing Gerlean # [id number] Enrolled patient in [facility] Discharge program: Yes, today. WOC team will perform another teaching session on Wed.   Thank-you,  Stephane Fought MSN, RN, CWOCN, CWCN-AP, CNS Contact Mon-Fri 0700-1500: [phone]

## 2024-05-23 DIAGNOSIS — K5720 Diverticulitis of large intestine with perforation and abscess without bleeding: Secondary | ICD-10-CM | POA: Diagnosis not present

## 2024-05-23 DIAGNOSIS — I480 Paroxysmal atrial fibrillation: Secondary | ICD-10-CM | POA: Diagnosis not present

## 2024-05-23 DIAGNOSIS — I4729 Other ventricular tachycardia: Secondary | ICD-10-CM | POA: Diagnosis not present

## 2024-05-23 LAB — BASIC METABOLIC PANEL WITH GFR
Anion gap: 10 (ref 5–15)
BUN: 12 mg/dL (ref 6–20)
CO2: 25 mmol/L (ref 22–32)
Calcium: 8.7 mg/dL — ABNORMAL LOW (ref 8.9–10.3)
Chloride: 102 mmol/L (ref 98–111)
Creatinine, Ser: 0.86 mg/dL (ref 0.61–1.24)
GFR, Estimated: >60 mL/min (ref >60)
Glucose, Bld: 82 mg/dL (ref 70–99)
Potassium: 3.7 mmol/L (ref 3.5–5.1)
Sodium: 137 mmol/L (ref 135–145)

## 2024-05-23 LAB — PHOSPHORUS: Phosphorus: 4 mg/dL (ref 2.5–4.6)

## 2024-05-23 LAB — MAGNESIUM: Magnesium: 2.2 mg/dL (ref 1.7–2.4)

## 2024-05-23 LAB — SURGICAL PATHOLOGY

## 2024-05-23 MED ORDER — AMOXICILLIN-POT CLAVULANATE 875-125 MG PO TABS: 1.0000 | ORAL_TABLET | Freq: Two times a day (BID) | ORAL | 0 refills | Status: AC

## 2024-05-23 MED ORDER — OXYCODONE HCL 5 MG PO TABS: 5.0000 mg | ORAL_TABLET | Freq: Four times a day (QID) | ORAL | 0 refills | Status: AC | PRN

## 2024-05-23 NOTE — Plan of Care (Signed)

## 2024-05-23 NOTE — Plan of Care (Signed)

## 2024-05-23 NOTE — Progress Notes (Signed)
 [facility] SURGICAL PROGRESS NOTE  Hospital Day(s): 5.   Post op day(s): 4 Days Post-Op.   Interval History:  Patient seen and examined No acute events or new complaints overnight.  Patient reports he is feeling much better Abdominal pain markedly improved  No fever, chills, nausea, emesis Renal function remains normal; sCr - 0 86; UO - 1050 ccs (he has voided since catheter removal) No electrolyte derangements Surgical drain with 110 ccs: serous FLD; no issues - asking for more He is having gas/stool via colostomy   Vital signs in last 24 hours: [min-max] current  Temp:  [98.4 F (36.9 C)-98.6 F (37 C)] 98.6 F (37 C) (11/04 0337) Pulse Rate:  [75-91] 75 (11/04 0337) Resp:  [18-19] 19 (11/04 0337) BP: (164-167)/(77-86) 164/86 (11/04 0337) SpO2:  [95 %-100 %] 100 % (11/04 0337)     Height: 5' 3 (160 cm) Weight: 75 kg BMI (Calculated): 29.3   Intake/Output last 2 shifts:  11/03 0701 - 11/04 0700 In: 714.6 [I.V.:566.7; IV Piggyback:148] Out: 1360 [Urine:1050; Drains:110; Stool:200]   Physical Exam:  Constitutional: alert, cooperative and no distress  Respiratory: breathing non-labored at rest  Cardiovascular: regular rate and sinus rhythm  Gastrointestinal: soft, incisional soreness improving, and non-distended, no rebound/guarding. Surgical drain in right abdomen; output serous (removed). Colostomy in left abdomen; gas in bag Integumentary: Laparotomy is CDI with staples; penrose in place; no erythema (Honeycomb removed)  Labs:     Latest Ref Rng & Units 05/21/2024    6:15 AM 05/20/2024    8:24 AM 05/19/2024    4:19 AM  CBC  WBC 4.0 - 10.5 K/uL 11.8  15.0  12.0   Hemoglobin 13.0 - 17.0 g/dL 87.3  86.6  87.4   Hematocrit 39.0 - 52.0 % 38.3  40.1  37.1   Platelets 150 - 400 K/uL 332  287  293       Latest Ref Rng & Units 05/23/2024    4:49 AM 05/22/2024    4:33 AM 05/21/2024    6:15 AM  CMP  Glucose 70 - 99 mg/dL 82  893  893   BUN 6 - 20 mg/dL  12  11  16    Creatinine 0.61 - 1.24 mg/dL 9.13  9.09  9.11   Sodium 135 - 145 mmol/L 137  135  140   Potassium 3.5 - 5.1 mmol/L 3.7  3.9  4.0   Chloride 98 - 111 mmol/L 102  99  103   CO2 22 - 32 mmol/L 25  29  25    Calcium  8.9 - 10.3 mg/dL 8.7  8.6  8.7     Imaging studies: No new pertinent imaging studies   Assessment/Plan: 58 y.o. male 4 Days Post-Op s/p Hartman's Procedure for perforated diverticulitis    - Advance to soft diet  - Continue IV Abx (Zosyn); Will send PO Augmentin for home x10 days (14 total IV + PO)  - Removed surgical drain; dressing placed - Monitor abdominal examination - WOC RN on board for ostomy teaching; Appreciate assistance   - Pain control prn; antiemetics prn   - Mobilize; therapies on board - Further management per primary service; we will follow   - Discharge Planning:  - If he tolerates diet advancement, okay for DC home this afternoon from surgical perspective  - Will send Abx and analgesic prescriptions this morning - Follow up next week for staple removal; instructions updated  - 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  -- Arthea Platt, PA-C [facility] 05/23/2024, 8:05 AM M-F: 7am - 4pm

## 2024-05-23 NOTE — Progress Notes (Signed)
 Mobility Specialist - Progress Note   05/23/24 1038  Mobility  Activity Ambulated independently  Level of Assistance Independent  Assistive Device None  Distance Ambulated (ft) 320 ft  Activity Response Tolerated well  Mobility visit 1 Mobility  Mobility Specialist Start Time (ACUTE ONLY) 1028  Mobility Specialist Stop Time (ACUTE ONLY) 1033  Mobility Specialist Time Calculation (min) (ACUTE ONLY) 5 min   Pt amb two laps around the NS indep, tolerated well. Pt returned to the room, left amb near the sink w/ RN present at bedside.  America Silvan Mobility Specialist 05/23/24 10:40 AM

## 2024-05-23 NOTE — Discharge Summary (Signed)
 Physician Discharge Summary   Patient: Marcus [name] MRN: [id number] DOB: January 11, 1966  Admit date:     05/18/2024  Discharge date: 05/23/24  Discharge Physician: Murvin Mana   PCP: Pcp, No   Recommendations at discharge:   Follow-up with PCP in 1 to 2 weeks, TOC to arrange. Follow-up with general surgery as scheduled  Discharge Diagnoses: Principal Problem:   Diverticulitis Active Problems:   CAD in native artery   Diverticulitis of large intestine with perforation and abscess without bleeding   PAF (paroxysmal atrial fibrillation) (HCC)   Large bowel perforation (HCC)   Overweight (BMI 25.0-29.9)   NSVT (nonsustained ventricular tachycardia) (HCC)  Resolved Problems:   * No resolved hospital problems. *  Hospital Course: Marcus [name] is a 58 y.o. 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  CT scan showed perforated sigmoid diverticulosis. Exploratory laparotomy with Hetty procedure was performed 10/31.  Patient condition has improved, had a significant bowel movement from ostomy bag.  Tolerating diet.  Medically stable for discharge.   Assessment and Plan: Nonsustained ventricular tachycardia. Telemetry showed 4 beats of nonsustained ventricular tachycardia earlier 11/1.  Reviewed echocardiogram performed 07/2023, showed ejection fraction 55 to 60% with grade 1 diastolic dysfunction, no significant valvular disease. Did not have recurrence, electrolytes still normal today.  No need for further workup.   Sigmoid diverticulitis with perforation. Acute peritonitis secondary to bowel perforation. Status post the surgery, continued antibiotics Zosyn.  Patient had worsening pain after surgery.  But condition since has improved.  General surgery has  cleared patient for discharge, continue antibiotic with Augmentin for additional 10 days.   Urinary retention Patient had urine retention, Foley catheter was anchored by urology during surgery.  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  Per urology recommendation, Foley catheter was removed.  Patient was able to urinate.   Paroxysmal atrial fibrillation. Eliquis  restarted.     Coronary artery disease. Carotid stenosis status post stenting. Aspirin  and Plavix  restarted.   Alcohol abuse No evidence of alcohol withdrawal.     COPD. No exacerbation.   Overweight with BMI 29.29. Diet and exercise       Consultants: General Surgery Procedures performed: Laparotomy Disposition: Home Diet recommendation:  Discharge Diet Orders (From admission, onward)     Start     Ordered   05/23/24 0000  Diet - low sodium heart healthy        05/23/24 1013           Cardiac diet DISCHARGE MEDICATION: Allergies as of 05/23/2024       Reactions   Oxycodone-acetaminophen  Other (See Comments)   Codeine Rash        Medication List     STOP taking these medications    DULoxetine  60 MG capsule Commonly known as: CYMBALTA    metoprolol  succinate 25 MG 24 hr tablet Commonly known as: TOPROL -XL   QUEtiapine  200 MG tablet Commonly known as: SEROQUEL    Spiriva  Respimat 1.25 MCG/ACT Aers Generic drug: Tiotropium Bromide       TAKE these medications    amoxicillin-clavulanate 875-125 MG tablet Commonly known as: AUGMENTIN Take 1 tablet by mouth 2 (two) times daily for 10 days.   apixaban  5 MG Tabs tablet Commonly known as: ELIQUIS  Take 1 tablet (5 mg total) by mouth 2 (two) times daily.   aspirin  EC 81  MG tablet Take 81 mg by mouth daily. Swallow whole.   clopidogrel  75 MG tablet Commonly known as: PLAVIX  Take 1 tablet (75 mg total) by mouth daily.   ezetimibe  10 MG tablet Commonly known as: ZETIA  Take 1 tablet (10 mg total) by mouth daily.   hydrOXYzine  25 MG  capsule Commonly known as: VISTARIL  Take 25 mg by mouth 2 (two) times daily. What changed:  when to take this reasons to take this   irbesartan  150 MG tablet Commonly known as: AVAPRO  Take 150 mg by mouth daily.   isosorbide  mononitrate 60 MG 24 hr tablet Commonly known as: IMDUR  Take 1 tablet (60 mg total) by mouth 2 (two) times daily.   mometasone -formoterol  200-5 MCG/ACT Aero Commonly known as: DULERA  Inhale 2 puffs into the lungs 2 (two) times daily.   multivitamin with minerals Tabs tablet Take 1 tablet by mouth daily.   nicotine  21 mg/24hr patch Commonly known as: NICODERM CQ  - dosed in mg/24 hours Place 21 mg onto the skin daily as needed.   nitroGLYCERIN  0.4 MG SL tablet Commonly known as: NITROSTAT  Place 1 tablet (0.4 mg total) under the tongue every 5 (five) minutes as needed for chest pain.   oxyCODONE 5 MG immediate release tablet Commonly known as: Oxy IR/ROXICODONE Take 1 tablet (5 mg total) by mouth every 6 (six) hours as needed for severe pain (pain score 7-10) or breakthrough pain.   pantoprazole  40 MG tablet Commonly known as: PROTONIX  Take 1 tablet (40 mg total) by mouth daily. What changed: when to take this   rosuvastatin  40 MG tablet Commonly known as: CRESTOR  Take 1 tablet (40 mg total) by mouth daily.   Ventolin  HFA 108 (90 Base) MCG/ACT inhaler Generic drug: albuterol  Inhale 1-2 puffs into the lungs every 4 (four) hours as needed for shortness of breath or wheezing. SMARTSIG:2 Puff(s) By Mouth Every 4 Hours PRN               Discharge Care Instructions  (From admission, onward)           Start     Ordered   05/23/24 0000  Discharge wound care:       Comments: Follow with general surgery   05/23/24 1013            Follow-up Information     Schulz, Zachary R, PA-C. Go on 05/31/2024.   Specialty: Physician Assistant Why: Go to appointment on 11/12 at 345 PM Contact information: [address] [place]  [address] [phone]                Discharge Exam: Marcus [name]   05/18/24 1141 05/19/24 1144  Weight: 75 kg 75 kg   General exam: Appears calm and comfortable  Respiratory system: Clear to auscultation. Respiratory effort normal. Cardiovascular system: S1 & S2 heard, RRR. No JVD, murmurs, rubs, gallops or clicks. No pedal edema. Gastrointestinal system: Abdomen is nondistended, soft and nontender. No organomegaly or masses felt. Normal bowel sounds heard. Central nervous system: Alert and oriented. No focal neurological deficits. Extremities: Symmetric 5 x 5 power. Skin: No rashes, lesions or ulcers Psychiatry: Judgement and insight appear normal. Mood & affect appropriate.    Condition at discharge: good  The results of significant diagnostics from this hospitalization (including imaging, microbiology, ancillary and laboratory) are listed below for reference.   Imaging Studies: CT ABDOMEN PELVIS W CONTRAST Result Date: 05/18/2024 CLINICAL DATA:  Left lower quadrant abdominal pain. EXAM: CT ABDOMEN AND PELVIS  WITH CONTRAST TECHNIQUE: Multidetector CT imaging of the abdomen and pelvis was performed using the standard protocol following bolus administration of intravenous contrast. RADIATION DOSE REDUCTION: This exam was performed according to the departmental dose-optimization program which includes automated exposure control, adjustment of the mA and/or kV according to patient size and/or use of iterative reconstruction technique. CONTRAST:  OMNIPAQUE  IOHEXOL  300 MG/ML  SOLN COMPARISON:  CT dated 02/22/2024. FINDINGS: Lower chest: The visualized lung bases are clear. There is coronary vascular calcification of the LAD. Scattered pockets of pneumoperitoneum and small free fluid in the pelvis. Hepatobiliary: The liver is unremarkable no biliary dilatation. The gallbladder is unremarkable. Pancreas: Unremarkable. No pancreatic ductal dilatation or surrounding inflammatory  changes. Spleen: Normal in size without focal abnormality. Adrenals/Urinary Tract: The right adrenal gland is unremarkable. Mild left adrenal thickening. There is no hydronephrosis on either side. There is symmetric enhancement and excretion of contrast by both kidneys. The visualized ureters appear unremarkable. The urinary bladder is minimally distended and grossly remarkable. Stomach/Bowel: There is sigmoid diverticulosis. Diffuse thickening and inflammatory changes of the sigmoid colon consistent with acute diverticulitis. There is apparent focal area of perforation (66/2 and coronal 42/5). Extensive perisigmoid edema. There is a small partially loculated fluid measuring 1.5 x 3 cm in the pelvis (62/2) concerning for developing abscess. No drainable fluid collection at this time. There is no bowel obstruction. The appendix is normal. Vascular/Lymphatic: Moderate calcified and noncalcified plaque of the abdominal aorta. The IVC is unremarkable. No portal venous gas. There is no adenopathy. Reproductive: The prostate and seminal vesicles are grossly unremarkable. Other: Bilateral inguinal and umbilical fat containing hernias. Musculoskeletal: Degenerative changes of the spine. No acute osseous pathology. IMPRESSION: 1. Acute perforated sigmoid diverticulitis with small developing abscess. No drainable fluid collection at this time. Follow-up recommended to exclude underlying colonic mass. 2. No bowel obstruction. Normal appendix. 3.  Aortic Atherosclerosis (ICD10-I70.0). These results were called by telephone at the time of interpretation on 05/18/2024 at 2:10 pm to provider KEVIN PADUCHOWSKI , who verbally acknowledged these results. Electronically Signed   By: Vanetta Chou M.D.   On: 05/18/2024 14:11   VAS US  CAROTID Result Date: 05/17/2024 Carotid Arterial Duplex Study Patient Name:  Marcus [name]  Date of Exam:   05/12/2024 Medical Rec #: [id number]         Accession #:    [id number] Date of Birth:  [birth year]         Patient Gender: M Patient Age:   26 years Exam Location:  [facility] Procedure:      VAS US  CAROTID Referring Phys: SELINDA GU --------------------------------------------------------------------------------  Indications:       Carotid artery disease. Risk Factors:      Hyperlipidemia, coronary artery disease. Other Factors:     01/03/2024 LT ICA stent                    08/31/2022: Rt ICA Stent. Comparison Study:  04/2023 Performing Technologist: Jerel Croak RVT  Examination Guidelines: A complete evaluation includes B-mode imaging, spectral Doppler, color Doppler, and power Doppler as needed of all accessible portions of each vessel. Bilateral testing is considered an integral part of a complete examination. Limited examinations for reoccurring indications may be performed as noted.  Right Carotid Findings: +----------+--------+--------+--------+------------------+--------+           PSV cm/sEDV cm/sStenosisPlaque DescriptionComments +----------+--------+--------+--------+------------------+--------+ CCA Prox  62      13                                         +----------+--------+--------+--------+------------------+--------+  CCA Mid   63      15                                         +----------+--------+--------+--------+------------------+--------+ CCA Distal51      13                                stent    +----------+--------+--------+--------+------------------+--------+ ICA Prox  68      15                                stent    +----------+--------+--------+--------+------------------+--------+ ICA Mid   78      22                                stent    +----------+--------+--------+--------+------------------+--------+ ICA Distal49      12                                         +----------+--------+--------+--------+------------------+--------+ ECA       196     29      >50%                      stent     +----------+--------+--------+--------+------------------+--------+ +----------+--------+-------+----------------+-------------------+           PSV cm/sEDV cmsDescribe        Arm Pressure (mmHG) +----------+--------+-------+----------------+-------------------+ [id number]             Multiphasic, WNL                    +----------+--------+-------+----------------+-------------------+ +---------+--------+--+--------+--+---------+ VertebralPSV cm/s54EDV cm/s13Antegrade +---------+--------+--+--------+--+---------+  Left Carotid Findings: +----------+--------+--------+--------+------------------+--------+           PSV cm/sEDV cm/sStenosisPlaque DescriptionComments +----------+--------+--------+--------+------------------+--------+ CCA Prox  83      16                                         +----------+--------+--------+--------+------------------+--------+ CCA Mid   58      16                                         +----------+--------+--------+--------+------------------+--------+ CCA Distal47      13                                stent    +----------+--------+--------+--------+------------------+--------+ ICA Prox  50      13                                stent    +----------+--------+--------+--------+------------------+--------+ ICA Mid   72      20  stent    +----------+--------+--------+--------+------------------+--------+ ICA Distal106     33                                         +----------+--------+--------+--------+------------------+--------+ ECA       249     23      >50%                      stent    +----------+--------+--------+--------+------------------+--------+ +----------+--------+--------+----------------+-------------------+           PSV cm/sEDV cm/sDescribe        Arm Pressure (mmHG) +----------+--------+--------+----------------+-------------------+ [id number]              Multiphasic, WNL                    +----------+--------+--------+----------------+-------------------+ +---------+--------+--+--------+--+---------+ VertebralPSV cm/s28EDV cm/s10Antegrade +---------+--------+--+--------+--+---------+   Summary: Right Carotid: Velocities in the right ICA are consistent with a 1-39% stenosis.                Non-hemodynamically significant plaque <50% noted in the CCA. The                ECA appears >50% stenosed. Patent ICA stent. Left Carotid: Velocities in the left ICA are consistent with a 1-39% stenosis.               Non-hemodynamically significant plaque <50% noted in the CCA. The               ECA appears >50% stenosed. Patent ICA stent. Vertebrals:  Bilateral vertebral arteries demonstrate antegrade flow. Subclavians: Normal flow hemodynamics were seen in bilateral subclavian              arteries. *See table(s) above for measurements and observations.  Electronically signed by Selinda Gu MD on 05/17/2024 at 1:15:39 PM.    Final     Microbiology: Results for orders placed or performed during the hospital encounter of 01/03/24  MRSA Next Gen by PCR, Nasal     Status: None   Collection Time: 01/03/24 11:28 AM   Specimen: Nasal Mucosa; Nasal Swab  Result Value Ref Range Status   MRSA by PCR Next Gen NOT DETECTED NOT DETECTED Final    Comment: (NOTE) The GeneXpert MRSA Assay (FDA approved for NASAL specimens only), is one component of a comprehensive MRSA colonization surveillance program. It is not intended to diagnose MRSA infection nor to guide or monitor treatment for MRSA infections. Test performance is not FDA approved in patients less than 76 years old. Performed at [facility], [address] [place], [place] [address]     Labs: CBC: Recent Labs  Lab 05/18/24 1143 05/19/24 0419 05/20/24 0824 05/21/24 0615  WBC 11.7* 12.0* 15.0* 11.8*  HGB 14.1 12.5* 13.3 12.6*  HCT 40.8 37.1* 40.1 38.3*  MCV 93.2 96.1 97.6 96.7   PLT 361 293 287 332   Basic Metabolic Panel: Recent Labs  Lab 05/19/24 0419 05/20/24 0824 05/21/24 0615 05/22/24 0433 05/23/24 0449  NA 138 140 140 135 137  K 3.6 4.6 4.0 3.9 3.7  CL 104 105 103 99 102  CO2 25 22 25 29 25   GLUCOSE 75 138* 106* 106* 82  BUN 13 18 16 11 12   CREATININE 0.95 0.86 0.88 0.90 0.86  CALCIUM  8.1* 8.5* 8.7* 8.6* 8.7*  MG 1.8 2.3 2.3 2.1 2.2  PHOS  --   --   --   --  4.0   Liver Function Tests: Recent Labs  Lab 05/18/24 1143  AST 18  ALT 21  ALKPHOS 70  BILITOT 1.0  PROT 7.3  ALBUMIN 3.1*   CBG: Recent Labs  Lab 05/19/24 2147  GLUCAP 129*    Discharge time spent: 35 minutes.  Signed: Murvin Mana, MD [facility] 05/23/2024

## 2024-05-31 ENCOUNTER — Encounter: Payer: Self-pay | Admitting: Physician Assistant

## 2024-05-31 ENCOUNTER — Ambulatory Visit (INDEPENDENT_AMBULATORY_CARE_PROVIDER_SITE_OTHER): Admitting: Physician Assistant

## 2024-05-31 VITALS — BP 170/79 | HR 82 | Ht 63.0 in | Wt 154.0 lb

## 2024-05-31 DIAGNOSIS — Z09 Encounter for follow-up examination after completed treatment for conditions other than malignant neoplasm: Secondary | ICD-10-CM

## 2024-05-31 DIAGNOSIS — Z433 Encounter for attention to colostomy: Secondary | ICD-10-CM

## 2024-05-31 DIAGNOSIS — K5720 Diverticulitis of large intestine with perforation and abscess without bleeding: Secondary | ICD-10-CM

## 2024-05-31 MED ORDER — CYCLOBENZAPRINE HCL 5 MG PO TABS: 5.0000 mg | ORAL_TABLET | Freq: Every day | ORAL | Status: DC

## 2024-05-31 NOTE — Progress Notes (Signed)
 [facility] POST-OP OFFICE VISIT  05/31/2024  HPI: Marcus [name] is a 58 y.o. male 12 days s/p [facility]'s Procedure for perforated diverticulitis with Dr Desiderio   He is doing reasonably well Abdomen remains sore No fever, chills, nausea, emesis  He is adjusting to his ostomy well; this is working - he is running out of supplies; had denied HH in hospital.  He continues on Augmentin Continues to have staples; penrose fell out at home  Additionally, he did need foley catheter placement with urology intra-operatively given a stricture. He was able to void after removal. 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 He reports he was told at some point he would need what sounds like urethral resection and reconstruction. He does not believe he was given urology follow up at [facility].   Vital signs: BP (!) 170/79   Pulse 82   Ht 5' 3 (1.6 m)   Wt 154 lb (69.9 kg)   SpO2 99%   BMI 27.28 kg/m    Physical Exam: Constitutional: Well appearing male, NAD Abdomen: Soft, incisional soreness, non-distended, no rebound/guarding. 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 male 12 days s/p Hartman's Procedure for perforated diverticulitis with Dr Desiderio    - Staples removed; no issues  - Complete antibiotics as prescribed. Encouraged to monitor area of erythema to inferior portion of his incision and if this worsens, or develops fever/drainage, to call back sooner   - I will give him a referral to the ostomy clinic. I suspect he will have issues with transportation but this can be a resource. He denied HH RN at discharge.   - Pain control prn; added flexeril QHS  - Reviewed wound care recommendation  - Reviewed lifting restrictions; 6 weeks total  - Reviewed  surgical pathology  - I would like to get him plugged in with urology given his history of amyloid and noted urethral stricture intra-operatively. I worry this may become an issue in time. I have asked our staff to place referral.   - We will follow up in ~2 weeks; He understands to call with questions/concerns in the interim.   -- Arthea Platt, PA-C [facility] 05/31/2024, 3:46 PM M-F: 7am - 4pm

## 2024-05-31 NOTE — Patient Instructions (Addendum)
 Use Supplies: barrier ring, Lawson # [id number], wafer Gerlean # 2, pouch Lawson # [id number]. Foam dressing Gerlean # [id number] Enrolled patient in [place] [phone]  We also referred you to the ostomy clinic, they will call you to schedule an appointment   We also removed your staples today. Call the office if you show any signs of infection, such as redness, swelling, nausea or vomiting and fevers.   We will see you back in a couple of weeks.   We sent flexeril to your pharmacy, you may take that at night    Colostomy Home Guide, Adult  A colostomy is a surgically created opening (ostomy) that brings a piece of the large intestine through an opening in the abdomen to create a stoma. The stoma allows stool (feces) and gas (flatus) to leave the body. A stoma may be a variety of shapes and sizes. An ostomy pouch or bag is used to cover the stoma and to contain the stool and gas. A colostomy may be temporary or permanent, depending on the medical reason for your surgery. After surgery, you will need to learn to care for your ostomy. This includes emptying and changing the colostomy bag as needed. There are many different types of bags or pouching options, including one-piece, two-piece, clear, fabric covered, flat or curved (convex), drainable, and closed or vented options. Your health care provider will help you select the best pouch for you. How to care for the stoma Your stoma should look pink, red, and moist, like the inside of your cheek. It should not be painful to touch, but it may bleed easily when rubbed or bumped. Soon after surgery, the stoma may be swollen, but this goes away within 6 weeks. To care for the stoma: Keep the skin around the stoma clean and dry. Use a clean, soft washcloth to gently wash the stoma and the skin around it. Clean using a circular motion, and wipe away from the stoma opening, nottoward it. Use warm water and only use cleansers, such as mild soap and stoma-safe adhesive  remover, recommended by your health care provider. Inspect and dry the skin around the stoma. Use stoma powder or skin protectant on your skin as told by your health care provider. Do not use any other powders, gels, wipes, or creams on the skin around the stoma. These may keep pouch adhesive from sticking or may cause injury to your stoma. Check the stoma area every day for signs of infection or problems. Check for: New or worsening redness, warmth, swelling, irritation, itching, or pain around the stoma. New or worsening changes to the stoma, such as bleeding, trauma, or a dark and dusky color. Changes to the drainage from the stoma, such as pus, blood, a large amount of liquid drainage, or little to no stool drainage. Measure the stoma opening regularly and record the size. Watch for changes, such as the stoma getting longer, becoming flat, or falling below skin level. (It is normal for the stoma to get smaller as the swelling goes away.) Share this information with your health care provider. Changes in the size or shape of the stoma may require a different style of pouch to be used. How to empty the colostomy bag     Empty your bag at bedtime, before physical activity or sexual relations, and whenever it is one-third to one-half full. Do not let the bag get more than half-full with stool or gas. The bag could leak if it gets too full. Some  colostomy bags have a built-in gas release valve that releases gas often throughout the day. Follow these basic steps for emptying a drainable pouch: Prepare the toilet or container: If draining directly into the toilet, put several pieces of toilet paper into the toilet water. This will prevent splashing as you empty the stool into the toilet. Sit far back on the toilet seat. If draining into a container, place a few pieces of moistened toilet paper in the bottom of the container. This can help when emptying the container. Remove the clip or the hook-and-loop  fastener from the tail end of the bag. Unroll the tail, then empty the stool into the toilet or container. Clean the tail with toilet paper or a moist towelette. Reroll the tail, and close it with the clip or the hook-and-loop fastener. Wash your hands. How to change the colostomy bag Change your bag every 3-4 days or as often as told by your health care provider. Also change the bag if it is leaking or separating from the skin, or if your skin around the stoma looks or feels irritated. Irritated skin may be a sign that the bag is leaking. Always have colostomy supplies with you, and follow these basic steps: Have paper towels or tissues and a trash bag nearby to clean any discharge and dispose of the soiled pouch. Remove the old pouching appliance. Use your fingers, a warm, moist cloth, or a stoma-safe adhesive remover to gently remove the pouch and remove adhesive residue. Clean the stoma area with water or with mild soap and water, as directed. Use water to rinse away any soap. Dry the skin. You may use the cool setting on a hair dryer to do this. Use a tracing pattern (template) to cut the skin barrier to the size needed. The opening should be large enough to fit around the stoma, but you should not see exposed skin. If you are using a two-piece bag, attach the bag and the base to each other. Add the barrier ring, if you use one. If directed, apply stoma powder or skin protectant to the skin. Warm the pouch base with your hands or blow with a hair dryer for 5-10 seconds. Remove the paper from the adhesive backing of the pouch base. Press the adhesive backing onto the skin around the stoma. Gently rub the pouch base onto the skin. This creates heat that helps the barrier to stick. Apply barrier strips to the edges of the pouch, if desired. Dispose of the soiled pouch, and wash your hands. General recommendations Avoid wearing tight clothes or having anything press directly on your stoma or  bag. Change your clothing whenever it is soiled or damp. You may shower or bathe with the bag on or off. Do not use harsh or oily soaps or lotions. Dry the skin and bag after bathing. Do not shower or bathe right after changing the pouch. Wait 4-5 hours. Store all supplies in a cool, dry place. Do not leave supplies in extreme heat because some parts can melt or not stick as well. Whenever you leave home, take extra clothing and an extra ostomy pouch with you. If your bag gets wet, you can dry it with a hair dryer on the cool setting. To prevent odor, you may put drops of ostomy deodorizer in the bag. If recommended by your health care provider, put ostomy lubricant inside the bag. This helps stool slide out of the bag more easily and completely. Contact a health care provider  if: You have new or worsening redness, warmth, swelling, irritation, itching, or pain around the stoma. You have new or worsening changes to the stoma, such as bleeding, trauma, or a dark and dusky color. There are changes to the drainage from the stoma, such as pus, blood, a large amount of liquid drainage, or little to no stool drainage. Your stoma extends in or out farther than normal. You need to change your bag every day or have frequent leaks. You have a fever. Get help right away if: Your stool is bloody. You have nausea or you vomit. You have trouble breathing. These symptoms may be an emergency. Get help right away. Call 911. Do not wait to see if the symptoms will go away. Do not drive yourself to the hospital. Summary Measure your stoma opening regularly and record the size. Watch for changes. Empty your bag at bedtime, before physical activity or sexual relations, and whenever it is one-third to one-half full. Do not let the bag get more than half-full with stool or gas. Change your bag every 3-4 days or as often as told by your health care provider. Whenever you leave home, take extra clothing and an extra  ostomy pouch with you. This information is not intended to replace advice given to you by your health care provider. Make sure you discuss any questions you have with your health care provider. Document Revised: 07/16/2021 Document Reviewed: 07/16/2021 Elsevier Patient Education  2024 [employer].

## 2024-06-01 ENCOUNTER — Other Ambulatory Visit: Payer: Self-pay | Admitting: *Deleted

## 2024-06-01 DIAGNOSIS — N99114 Postprocedural urethral stricture, male, unspecified: Secondary | ICD-10-CM

## 2024-06-02 ENCOUNTER — Other Ambulatory Visit: Payer: Self-pay | Admitting: Surgery

## 2024-06-02 MED ORDER — CYCLOBENZAPRINE HCL 5 MG PO TABS: 5.0000 mg | ORAL_TABLET | Freq: Three times a day (TID) | ORAL | 0 refills | Status: AC | PRN

## 2024-06-14 ENCOUNTER — Ambulatory Visit: Admitting: Surgery

## 2024-06-14 ENCOUNTER — Encounter: Admitting: Surgery

## 2024-06-14 ENCOUNTER — Encounter: Payer: Self-pay | Admitting: Surgery

## 2024-06-14 VITALS — BP 150/80 | HR 90 | Ht 63.0 in | Wt 151.0 lb

## 2024-06-14 DIAGNOSIS — K5720 Diverticulitis of large intestine with perforation and abscess without bleeding: Secondary | ICD-10-CM

## 2024-06-14 DIAGNOSIS — Z09 Encounter for follow-up examination after completed treatment for conditions other than malignant neoplasm: Secondary | ICD-10-CM

## 2024-06-14 NOTE — Patient Instructions (Addendum)
 Follow-up with our office in 4 months. We will send you a letter about this appointment.   Follow up with Urology as scheduled.  Please call and ask to speak with a nurse if you develop questions or concerns.   Colostomy Home Guide, Adult  A colostomy is a surgically created opening (ostomy) that brings a piece of the large intestine through an opening in the abdomen to create a stoma. The stoma allows stool (feces) and gas (flatus) to leave the body. A stoma may be a variety of shapes and sizes. An ostomy pouch or bag is used to cover the stoma and to contain the stool and gas. A colostomy may be temporary or permanent, depending on the medical reason for your surgery. After surgery, you will need to learn to care for your ostomy. This includes emptying and changing the colostomy bag as needed. There are many different types of bags or pouching options, including one-piece, two-piece, clear, fabric covered, flat or curved (convex), drainable, and closed or vented options. Your health care provider will help you select the best pouch for you. How to care for the stoma Your stoma should look pink, red, and moist, like the inside of your cheek. It should not be painful to touch, but it may bleed easily when rubbed or bumped. Soon after surgery, the stoma may be swollen, but this goes away within 6 weeks. To care for the stoma: Keep the skin around the stoma clean and dry. Use a clean, soft washcloth to gently wash the stoma and the skin around it. Clean using a circular motion, and wipe away from the stoma opening, nottoward it. Use warm water and only use cleansers, such as mild soap and stoma-safe adhesive remover, recommended by your health care provider. Inspect and dry the skin around the stoma. Use stoma powder or skin protectant on your skin as told by your health care provider. Do not use any other powders, gels, wipes, or creams on the skin around the stoma. These may keep pouch adhesive from  sticking or may cause injury to your stoma. Check the stoma area every day for signs of infection or problems. Check for: New or worsening redness, warmth, swelling, irritation, itching, or pain around the stoma. New or worsening changes to the stoma, such as bleeding, trauma, or a dark and dusky color. Changes to the drainage from the stoma, such as pus, blood, a large amount of liquid drainage, or little to no stool drainage. Measure the stoma opening regularly and record the size. Watch for changes, such as the stoma getting longer, becoming flat, or falling below skin level. (It is normal for the stoma to get smaller as the swelling goes away.) Share this information with your health care provider. Changes in the size or shape of the stoma may require a different style of pouch to be used. How to empty the colostomy bag     Empty your bag at bedtime, before physical activity or sexual relations, and whenever it is one-third to one-half full. Do not let the bag get more than half-full with stool or gas. The bag could leak if it gets too full. Some colostomy bags have a built-in gas release valve that releases gas often throughout the day. Follow these basic steps for emptying a drainable pouch: Prepare the toilet or container: If draining directly into the toilet, put several pieces of toilet paper into the toilet water. This will prevent splashing as you empty the stool into the  toilet. Sit far back on the toilet seat. If draining into a container, place a few pieces of moistened toilet paper in the bottom of the container. This can help when emptying the container. Remove the clip or the hook-and-loop fastener from the tail end of the bag. Unroll the tail, then empty the stool into the toilet or container. Clean the tail with toilet paper or a moist towelette. Reroll the tail, and close it with the clip or the hook-and-loop fastener. Wash your hands. How to change the colostomy bag Change  your bag every 3-4 days or as often as told by your health care provider. Also change the bag if it is leaking or separating from the skin, or if your skin around the stoma looks or feels irritated. Irritated skin may be a sign that the bag is leaking. Always have colostomy supplies with you, and follow these basic steps: Have paper towels or tissues and a trash bag nearby to clean any discharge and dispose of the soiled pouch. Remove the old pouching appliance. Use your fingers, a warm, moist cloth, or a stoma-safe adhesive remover to gently remove the pouch and remove adhesive residue. Clean the stoma area with water or with mild soap and water, as directed. Use water to rinse away any soap. Dry the skin. You may use the cool setting on a hair dryer to do this. Use a tracing pattern (template) to cut the skin barrier to the size needed. The opening should be large enough to fit around the stoma, but you should not see exposed skin. If you are using a two-piece bag, attach the bag and the base to each other. Add the barrier ring, if you use one. If directed, apply stoma powder or skin protectant to the skin. Warm the pouch base with your hands or blow with a hair dryer for 5-10 seconds. Remove the paper from the adhesive backing of the pouch base. Press the adhesive backing onto the skin around the stoma. Gently rub the pouch base onto the skin. This creates heat that helps the barrier to stick. Apply barrier strips to the edges of the pouch, if desired. Dispose of the soiled pouch, and wash your hands. General recommendations Avoid wearing tight clothes or having anything press directly on your stoma or bag. Change your clothing whenever it is soiled or damp. You may shower or bathe with the bag on or off. Do not use harsh or oily soaps or lotions. Dry the skin and bag after bathing. Do not shower or bathe right after changing the pouch. Wait 4-5 hours. Store all supplies in a cool, dry place. Do  not leave supplies in extreme heat because some parts can melt or not stick as well. Whenever you leave home, take extra clothing and an extra ostomy pouch with you. If your bag gets wet, you can dry it with a hair dryer on the cool setting. To prevent odor, you may put drops of ostomy deodorizer in the bag. If recommended by your health care provider, put ostomy lubricant inside the bag. This helps stool slide out of the bag more easily and completely. Contact a health care provider if: You have new or worsening redness, warmth, swelling, irritation, itching, or pain around the stoma. You have new or worsening changes to the stoma, such as bleeding, trauma, or a dark and dusky color. There are changes to the drainage from the stoma, such as pus, blood, a large amount of liquid drainage, or little  to no stool drainage. Your stoma extends in or out farther than normal. You need to change your bag every day or have frequent leaks. You have a fever. Get help right away if: Your stool is bloody. You have nausea or you vomit. You have trouble breathing. These symptoms may be an emergency. Get help right away. Call 911. Do not wait to see if the symptoms will go away. Do not drive yourself to the hospital. Summary Measure your stoma opening regularly and record the size. Watch for changes. Empty your bag at bedtime, before physical activity or sexual relations, and whenever it is one-third to one-half full. Do not let the bag get more than half-full with stool or gas. Change your bag every 3-4 days or as often as told by your health care provider. Whenever you leave home, take extra clothing and an extra ostomy pouch with you. This information is not intended to replace advice given to you by your health care provider. Make sure you discuss any questions you have with your health care provider. Document Revised: 07/16/2021 Document Reviewed: 07/16/2021 Elsevier Patient Education  2024 [employer].

## 2024-06-14 NOTE — Progress Notes (Signed)
 06/14/2024  HPI: Marcus [name] is a 58 y.o. male s/p exploratory laparotomy with Hartman's procedure on 05/19/2024 for perforated diverticulitis with abscess.  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  Patient has an appointment with Dr. Twylla on 06/21/2024 to address his stricture.  From the abdominal standpoint, the patient reports that he has been doing well and denies any nausea or vomiting.  He is having normal bowel function.  He does report intermittent discomfort around the ostomy on the left side reports some skin excoriation.  On his last visit, a referral was sent to the wound ostomy clinic but the patient has issues with transportation with the clinic being in [place], he has a hard time trying to go.  However he did get set up with the supply company for the ostomy equipment.  Vital signs: BP (!) 150/80   Pulse 90   Ht 5' 3 (1.6 m)   Wt 151 lb (68.5 kg)   SpO2 98%   BMI 26.75 kg/m    Physical Exam: Constitutional: No acute distress Abdomen: Soft, nondistended, with soreness to palpation of the left abdomen at the site of the ostomy.  The midline incision is healing well and is clean, dry, intact without any evidence of infection.  Upon evaluation of the ostomy, it was noted that the appliance opening was cut too wide and was allowing the rounding skin to get irritated.  Appliance was removed and the area was cleansed.  A new ostomy appliance was placed with the appropriate opening to be snug against the bowel.  Assessment/Plan: This is a 58 y.o. male s/p exploratory laparotomy and Hartman's procedure.  - Patient is doing well from abdominal standpoint with mild issues with the ostomy appliance itself.  He had been cutting the opening too wide allowing skin to be excoriated.  Instructed him how to cut the opening little bit smaller and gave him the template that I used today so he can  use the same for future ostomy changes. - Patient very had a colonoscopy in 2024 which only showed internal hemorrhoids and diverticulosis.  Rectal polyp was removed but otherwise he would not require repeat colonoscopy prior to ostomy reversal. - Discussed with patient the preliminary plan for a robotic assisted colostomy reversal around late April.  Patient will follow-up with me around early April so that we can discuss the surgery further and schedule him. - Otherwise he can follow-up as needed until then.   Aloysius Sheree Plant, MD  [facility]

## 2024-06-21 ENCOUNTER — Ambulatory Visit: Admitting: Urology

## 2024-06-21 NOTE — Progress Notes (Deleted)
 06/22/2024 3:01 PM   Marcus [name] 07-29-1965 [id number]  Referring provider: Terryl Arthea SAUNDERS, PA-C [address] [place],  [place] [address]  Urological history: 1. Urethral stricture - eccentric penile urethral narrowing  No chief complaint on file.  HPI: Marcus [name] is a 58 y.o. man who presents today for follow up.    Previous records reviewed.  He was undergoing a colectomy/colostomy for perforated diverticulitis and our staff was unable to place a Foley catheter.  On exam he was found to have a circumcised phallus with a abnormally firm, nodular lesion along the length of the penile urethra.  Lesions were quite fixed, seemed continuous with the urethra.    I PSS ***  He reports sensation of incomplete bladder emptying,   urinary frequency,   urinary intermittency,   urinary urgency,   a weak urinary stream,   having to strain to void,   nocturia x ***,   leaking before being able to reach the restroom,   leaking with coughing,   leaking without awareness,   and post void dribbling.     He is wearing *** pads//depends  daily.    Patient denies any modifying or aggravating factors.  Patient denies any recent UTI's, gross hematuria, dysuria or suprapubic/flank pain.  Patient denies any fevers, chills, nausea or vomiting.  ***  He has a family history of PCa, colon cancer, ovarian cancer and/or breast cancer with ***.   He does not have a family history of PCa, colon cancer, ovarian cancer, and/or breast cancer .***     UA***  PVR***  Serum creatinine (05/2024) 0.86  PMH: Past Medical History:  Diagnosis Date   CAD in native artery    a. 06/28/2014: STEMI, cath LM nl, mLAD 50%, mLCx 20%, OM1 small in size 80%, ramus 100% s/p PCI/DES 0%, EF 40%    Carotid artery disease    CHF (congestive heart failure) (HCC)    Cocaine abuse (HCC)    Depression    H/O gastric ulcer    Hyperglycemia    a. A1C 5.6%   Hypertension     Ischemic cardiomyopathy    a. EF 40% by cath 06/28/2014   MI (myocardial infarction) (HCC)    Polysubstance abuse (HCC)    a. cocaine, tobacco, and alcohol    Tobacco abuse     Surgical History: Past Surgical History:  Procedure Laterality Date   CARDIAC CATHETERIZATION  06/28/2014   x1 stent   CAROTID PTA/STENT INTERVENTION Right 08/31/2022   Procedure: CAROTID PTA/STENT INTERVENTION;  Surgeon: Marea Selinda RAMAN, MD;  Location: [facility];  Service: Cardiovascular;  Laterality: Right;   CAROTID PTA/STENT INTERVENTION Left 01/03/2024   Procedure: CAROTID PTA/STENT INTERVENTION;  Surgeon: Marea Selinda RAMAN, MD;  Location: [facility];  Service: Cardiovascular;  Laterality: Left;   COLECTOMY WITH COLOSTOMY CREATION/HARTMANN PROCEDURE N/A 05/19/2024   Procedure: COLECTOMY, WITH COLOSTOMY CREATION;  Surgeon: Desiderio Schanz, MD;  Location: [facility];  Service: General;  Laterality: N/A;   COLONOSCOPY N/A 08/12/2022   Procedure: COLONOSCOPY;  Surgeon: Toledo, Ladell POUR, MD;  Location: [facility];  Service: Gastroenterology;  Laterality: N/A;   CORONARY STENT INTERVENTION N/A 08/26/2022   Procedure: CORONARY STENT INTERVENTION;  Surgeon: Mady Bruckner, MD;  Location: [facility];  Service: Cardiovascular;  Laterality: N/A;   CYSTOSCOPY  05/19/2024   Procedure: PHYLLIS SIDE;  Surgeon: Georganne Penne SAUNDERS, MD;  Location: [facility];  Service: Urology;;   ESOPHAGOGASTRODUODENOSCOPY  N/A 08/12/2022   Procedure: ESOPHAGOGASTRODUODENOSCOPY (EGD);  Surgeon: Toledo, Ladell POUR, MD;  Location: [facility];  Service: Gastroenterology;  Laterality: N/A;   LEFT HEART CATH AND CORONARY ANGIOGRAPHY Left 08/26/2022   Procedure: LEFT HEART CATH AND CORONARY ANGIOGRAPHY;  Surgeon: Mady Bruckner, MD;  Location: [facility];  Service: Cardiovascular;  Laterality: Left;   NISSEN FUNDOPLICATION     stomach ulcer surgery      Home Medications:  Allergies as of 06/22/2024        Reactions   Oxycodone -acetaminophen  Other (See Comments)   Codeine Rash        Medication List        Accurate as of June 21, 2024  3:01 PM. If you have any questions, ask your nurse or doctor.          apixaban  5 MG Tabs tablet Commonly known as: ELIQUIS  Take 1 tablet (5 mg total) by mouth 2 (two) times daily.   aspirin  EC 81 MG tablet Take 81 mg by mouth daily. Swallow whole.   clopidogrel  75 MG tablet Commonly known as: PLAVIX  Take 1 tablet (75 mg total) by mouth daily.   cyclobenzaprine  5 MG tablet Commonly known as: FLEXERIL  Take 1 tablet (5 mg total) by mouth 3 (three) times daily as needed for muscle spasms.   ezetimibe  10 MG tablet Commonly known as: ZETIA  Take 1 tablet (10 mg total) by mouth daily.   hydrOXYzine  25 MG capsule Commonly known as: VISTARIL  Take 25 mg by mouth 2 (two) times daily. What changed:  when to take this reasons to take this   irbesartan  150 MG tablet Commonly known as: AVAPRO  Take 150 mg by mouth daily.   isosorbide  mononitrate 60 MG 24 hr tablet Commonly known as: IMDUR  Take 1 tablet (60 mg total) by mouth 2 (two) times daily.   mometasone -formoterol  200-5 MCG/ACT Aero Commonly known as: DULERA  Inhale 2 puffs into the lungs 2 (two) times daily.   multivitamin with minerals Tabs tablet Take 1 tablet by mouth daily.   nitroGLYCERIN  0.4 MG SL tablet Commonly known as: NITROSTAT  Place 1 tablet (0.4 mg total) under the tongue every 5 (five) minutes as needed for chest pain.   oxyCODONE  5 MG immediate release tablet Commonly known as: Oxy IR/ROXICODONE  Take 1 tablet (5 mg total) by mouth every 6 (six) hours as needed for severe pain (pain score 7-10) or breakthrough pain.   pantoprazole  40 MG tablet Commonly known as: PROTONIX  Take 1 tablet (40 mg total) by mouth daily. What changed: when to take this   rosuvastatin  40 MG tablet Commonly known as: CRESTOR  Take 1 tablet (40 mg total) by mouth daily.   Ventolin  HFA  108 (90 Base) MCG/ACT inhaler Generic drug: albuterol  Inhale 1-2 puffs into the lungs every 4 (four) hours as needed for shortness of breath or wheezing. SMARTSIG:2 Puff(s) By Mouth Every 4 Hours PRN        Allergies:  Allergies  Allergen Reactions   Oxycodone -Acetaminophen  Other (See Comments)   Codeine Rash    Family History: Family History  Problem Relation Age of Onset   Heart failure Mother    Stroke Father    Heart failure Father    Prostate cancer Brother    Heart attack Brother     Social History:  reports that he has been smoking cigarettes. He started smoking about 48 years ago. He has a 48.9 pack-year smoking history. He has been exposed to tobacco smoke. He has never used  smokeless tobacco. He reports current alcohol use of about 12.0 standard drinks of alcohol per week. He reports that he does not use drugs.  ROS: Pertinent ROS in HPI  Physical Exam: There were no vitals taken for this visit.  Constitutional:  Well nourished. Alert and oriented, No acute distress. HEENT: [place] AT, moist mucus membranes.  Trachea midline, no masses. Cardiovascular: No clubbing, cyanosis, or edema. Respiratory: Normal respiratory effort, no increased work of breathing. GI: Abdomen is soft, non tender, non distended, no abdominal masses. Liver and spleen not palpable.  No hernias appreciated.  Stool sample for occult testing is not indicated.   GU: No CVA tenderness.  No bladder fullness or masses.  Patient with circumcised/uncircumcised phallus. ***Foreskin easily retracted***  Urethral meatus is patent.  No penile discharge. No penile lesions or rashes. Scrotum without lesions, cysts, rashes and/or edema.  Testicles are located scrotally bilaterally. No masses are appreciated in the testicles. Left and right epididymis are normal. Rectal: Patient with  normal sphincter tone. Anus and perineum without scarring or rashes. No rectal masses are appreciated. Prostate is approximately ***  grams, *** nodules are appreciated. Seminal vesicles are normal. Skin: No rashes, bruises or suspicious lesions. Lymph: No cervical or inguinal adenopathy. Neurologic: Grossly intact, no focal deficits, moving all 4 extremities. Psychiatric: Normal mood and affect.  Laboratory Data: See Epic and HPI   I have reviewed the labs.   Pertinent Imaging: ***  Assessment & Plan:  ***  1. Urethral stricture - PVR ***    No follow-ups on file.  These notes generated with voice recognition software. I apologize for typographical errors.  CLOTILDA HELON RIGGERS  [facility] [address]  [address] [place], [place] [address] [phone]

## 2024-06-22 ENCOUNTER — Ambulatory Visit: Admitting: Urology

## 2024-06-22 ENCOUNTER — Encounter: Payer: Self-pay | Admitting: Urology

## 2024-06-22 DIAGNOSIS — N35919 Unspecified urethral stricture, male, unspecified site: Secondary | ICD-10-CM

## 2024-06-23 ENCOUNTER — Ambulatory Visit (HOSPITAL_COMMUNITY): Admitting: Nurse Practitioner

## 2024-11-10 ENCOUNTER — Encounter (INDEPENDENT_AMBULATORY_CARE_PROVIDER_SITE_OTHER)

## 2024-11-10 ENCOUNTER — Ambulatory Visit (INDEPENDENT_AMBULATORY_CARE_PROVIDER_SITE_OTHER): Admitting: Nurse Practitioner
